# Patient Record
Sex: Female | Born: 1965 | Race: White | Hispanic: No | Marital: Single | Smoking: Former smoker
Health system: Southern US, Community
[De-identification: ages and names within clinical notes are randomized; demographics above are authoritative.]

## PROBLEM LIST (undated history)

## (undated) DIAGNOSIS — M2550 Pain in unspecified joint: Secondary | ICD-10-CM

## (undated) DIAGNOSIS — N951 Menopausal and female climacteric states: Secondary | ICD-10-CM

## (undated) DIAGNOSIS — Z860100 Personal history of colon polyps, unspecified: Secondary | ICD-10-CM

## (undated) DIAGNOSIS — E785 Hyperlipidemia, unspecified: Secondary | ICD-10-CM

## (undated) DIAGNOSIS — M899 Disorder of bone, unspecified: Secondary | ICD-10-CM

## (undated) DIAGNOSIS — G8929 Other chronic pain: Secondary | ICD-10-CM

## (undated) DIAGNOSIS — K219 Gastro-esophageal reflux disease without esophagitis: Secondary | ICD-10-CM

## (undated) DIAGNOSIS — M25539 Pain in unspecified wrist: Secondary | ICD-10-CM

## (undated) DIAGNOSIS — Z8541 Personal history of malignant neoplasm of cervix uteri: Secondary | ICD-10-CM

## (undated) DIAGNOSIS — K589 Irritable bowel syndrome without diarrhea: Secondary | ICD-10-CM

## (undated) DIAGNOSIS — F209 Schizophrenia, unspecified: Secondary | ICD-10-CM

## (undated) DIAGNOSIS — I10 Essential (primary) hypertension: Secondary | ICD-10-CM

## (undated) DIAGNOSIS — M545 Low back pain, unspecified: Secondary | ICD-10-CM

## (undated) DIAGNOSIS — N318 Other neuromuscular dysfunction of bladder: Secondary | ICD-10-CM

## (undated) DIAGNOSIS — L259 Unspecified contact dermatitis, unspecified cause: Secondary | ICD-10-CM

## (undated) DIAGNOSIS — M949 Disorder of cartilage, unspecified: Secondary | ICD-10-CM

## (undated) DIAGNOSIS — G2581 Restless legs syndrome: Secondary | ICD-10-CM

## (undated) DIAGNOSIS — Z87448 Personal history of other diseases of urinary system: Secondary | ICD-10-CM

## (undated) DIAGNOSIS — G43909 Migraine, unspecified, not intractable, without status migrainosus: Secondary | ICD-10-CM

## (undated) HISTORY — DX: Unspecified contact dermatitis, unspecified cause: L25.9

## (undated) HISTORY — DX: Personal history of malignant neoplasm of cervix uteri: Z85.41

## (undated) HISTORY — DX: Pain in unspecified wrist: M25.539

## (undated) HISTORY — DX: Personal history of colonic polyps: Z86.010

## (undated) HISTORY — DX: Irritable bowel syndrome, unspecified: K58.9

## (undated) HISTORY — DX: Personal history of colon polyps, unspecified: Z86.0100

## (undated) HISTORY — DX: Migraine, unspecified, not intractable, without status migrainosus: G43.909

## (undated) HISTORY — DX: Hyperlipidemia, unspecified: E78.5

## (undated) HISTORY — PX: FOOT SURGERY: SHX648

## (undated) HISTORY — DX: Low back pain, unspecified: M54.50

## (undated) HISTORY — DX: Menopausal and female climacteric states: N95.1

## (undated) HISTORY — DX: Schizophrenia, unspecified: F20.9

## (undated) HISTORY — DX: Essential (primary) hypertension: I10

## (undated) HISTORY — DX: Other neuromuscular dysfunction of bladder: N31.8

## (undated) HISTORY — DX: Pain in unspecified joint: M25.50

## (undated) HISTORY — PX: BREAST LUMPECTOMY: SHX2

## (undated) HISTORY — DX: Restless legs syndrome: G25.81

## (undated) HISTORY — DX: Disorder of cartilage, unspecified: M94.9

## (undated) HISTORY — DX: Low back pain: M54.5

## (undated) HISTORY — DX: Disorder of bone, unspecified: M89.9

## (undated) HISTORY — DX: Gastro-esophageal reflux disease without esophagitis: K21.9

## (undated) HISTORY — DX: Personal history of other diseases of urinary system: Z87.448

## (undated) HISTORY — DX: Other chronic pain: G89.29

---

## 1986-01-05 HISTORY — PX: HAND SURGERY: SHX662

## 2003-01-06 HISTORY — PX: CERVICAL CONE BIOPSY: SUR198

## 2003-01-06 HISTORY — PX: VAGINAL HYSTERECTOMY: SHX2639

## 2003-01-06 HISTORY — PX: BILATERAL OOPHORECTOMY: SHX1221

## 2005-01-05 HISTORY — PX: SHOULDER ARTHROSCOPY: SHX128

## 2008-01-06 HISTORY — PX: RETINAL DETACHMENT SURGERY: SHX105

## 2010-08-11 ENCOUNTER — Ambulatory Visit: Payer: Self-pay | Admitting: Family Medicine

## 2010-09-09 ENCOUNTER — Ambulatory Visit: Payer: Self-pay | Admitting: Gastroenterology

## 2010-10-01 ENCOUNTER — Ambulatory Visit: Payer: Self-pay

## 2010-10-07 ENCOUNTER — Ambulatory Visit: Payer: Self-pay | Admitting: Sports Medicine

## 2010-10-09 ENCOUNTER — Ambulatory Visit: Payer: Self-pay | Admitting: Gastroenterology

## 2010-10-15 ENCOUNTER — Ambulatory Visit: Payer: Self-pay | Admitting: Family Medicine

## 2010-10-29 ENCOUNTER — Ambulatory Visit: Payer: Self-pay | Admitting: Cardiovascular Disease

## 2010-10-31 ENCOUNTER — Ambulatory Visit: Payer: Self-pay | Admitting: Cardiovascular Disease

## 2010-11-19 ENCOUNTER — Ambulatory Visit: Payer: Self-pay | Admitting: Cardiovascular Disease

## 2011-02-25 ENCOUNTER — Encounter: Payer: Self-pay | Admitting: *Deleted

## 2011-03-31 ENCOUNTER — Ambulatory Visit: Payer: Self-pay | Admitting: Unknown Physician Specialty

## 2011-06-26 ENCOUNTER — Emergency Department: Payer: Self-pay | Admitting: Emergency Medicine

## 2011-08-25 DIAGNOSIS — N3941 Urge incontinence: Secondary | ICD-10-CM | POA: Insufficient documentation

## 2011-08-27 ENCOUNTER — Ambulatory Visit: Payer: Self-pay | Admitting: Specialist

## 2011-09-03 DIAGNOSIS — M542 Cervicalgia: Secondary | ICD-10-CM | POA: Insufficient documentation

## 2011-09-03 DIAGNOSIS — M5414 Radiculopathy, thoracic region: Secondary | ICD-10-CM | POA: Insufficient documentation

## 2011-09-04 LAB — COMPREHENSIVE METABOLIC PANEL
Anion Gap: 6 — ABNORMAL LOW (ref 7–16)
Bilirubin,Total: 0.7 mg/dL (ref 0.2–1.0)
Calcium, Total: 8.7 mg/dL (ref 8.5–10.1)
Chloride: 108 mmol/L — ABNORMAL HIGH (ref 98–107)
Co2: 26 mmol/L (ref 21–32)
Creatinine: 1 mg/dL (ref 0.60–1.30)
EGFR (Non-African Amer.): >60
Osmolality: 280 (ref 275–301)
Potassium: 3.9 mmol/L (ref 3.5–5.1)
Sodium: 140 mmol/L (ref 136–145)
Total Protein: 7.3 g/dL (ref 6.4–8.2)

## 2011-09-04 LAB — URINALYSIS, COMPLETE
Bilirubin,UR: NEGATIVE
Blood: NEGATIVE
Ketone: NEGATIVE
Ph: 5 (ref 4.5–8.0)
Protein: NEGATIVE
RBC,UR: 4 /HPF (ref 0–5)
Specific Gravity: 1.02 (ref 1.003–1.030)
WBC UR: 59 /HPF (ref 0–5)

## 2011-09-04 LAB — CBC
HGB: 14.4 g/dL (ref 12.0–16.0)
MCH: 31.6 pg (ref 26.0–34.0)
MCV: 90 fL (ref 80–100)
RBC: 4.55 10*6/uL (ref 3.80–5.20)

## 2011-09-04 LAB — ETHANOL: Ethanol: <3 mg/dL

## 2011-09-04 LAB — DRUG SCREEN, URINE
Amphetamines, Ur Screen: NEGATIVE (ref ?–1000)
Barbiturates, Ur Screen: POSITIVE (ref ?–200)
Benzodiazepine, Ur Scrn: POSITIVE (ref ?–200)
Cannabinoid 50 Ng, Ur ~~LOC~~: NEGATIVE (ref ?–50)
MDMA (Ecstasy)Ur Screen: NEGATIVE (ref ?–500)
Methadone, Ur Screen: NEGATIVE (ref ?–300)
Opiate, Ur Screen: POSITIVE (ref ?–300)
Phencyclidine (PCP) Ur S: NEGATIVE (ref ?–25)

## 2011-09-04 LAB — SALICYLATE LEVEL: Salicylates, Serum: 5.4 mg/dL — ABNORMAL HIGH

## 2011-09-04 LAB — PREGNANCY, URINE: Pregnancy Test, Urine: NEGATIVE m[IU]/mL

## 2011-09-04 LAB — TSH: Thyroid Stimulating Horm: 1.35 u[IU]/mL

## 2011-09-05 ENCOUNTER — Inpatient Hospital Stay: Payer: Self-pay | Admitting: Psychiatry

## 2011-09-15 ENCOUNTER — Ambulatory Visit: Payer: Self-pay | Admitting: Family Medicine

## 2011-09-16 ENCOUNTER — Ambulatory Visit: Payer: Self-pay | Admitting: Family Medicine

## 2011-09-22 DIAGNOSIS — R339 Retention of urine, unspecified: Secondary | ICD-10-CM | POA: Insufficient documentation

## 2011-11-12 DIAGNOSIS — R7301 Impaired fasting glucose: Secondary | ICD-10-CM | POA: Insufficient documentation

## 2012-03-04 ENCOUNTER — Emergency Department: Payer: Self-pay | Admitting: Emergency Medicine

## 2012-03-04 LAB — CBC
HGB: 13.8 g/dL (ref 12.0–16.0)
MCH: 28.4 pg (ref 26.0–34.0)
MCHC: 32.7 g/dL (ref 32.0–36.0)
MCV: 87 fL (ref 80–100)
Platelet: 231 10*3/uL (ref 150–440)
RBC: 4.84 10*6/uL (ref 3.80–5.20)
WBC: 10.2 10*3/uL (ref 3.6–11.0)

## 2012-03-04 LAB — COMPREHENSIVE METABOLIC PANEL
Alkaline Phosphatase: 141 U/L — ABNORMAL HIGH (ref 50–136)
Anion Gap: 3 — ABNORMAL LOW (ref 7–16)
Bilirubin,Total: 0.8 mg/dL (ref 0.2–1.0)
Calcium, Total: 8.6 mg/dL (ref 8.5–10.1)
Co2: 26 mmol/L (ref 21–32)
Creatinine: 0.93 mg/dL (ref 0.60–1.30)
Osmolality: 272 (ref 275–301)
Potassium: 4.6 mmol/L (ref 3.5–5.1)
SGOT(AST): 20 U/L (ref 15–37)
SGPT (ALT): 20 U/L (ref 12–78)

## 2012-03-15 DIAGNOSIS — E669 Obesity, unspecified: Secondary | ICD-10-CM | POA: Insufficient documentation

## 2012-03-15 DIAGNOSIS — Z720 Tobacco use: Secondary | ICD-10-CM | POA: Insufficient documentation

## 2012-03-15 DIAGNOSIS — S42209A Unspecified fracture of upper end of unspecified humerus, initial encounter for closed fracture: Secondary | ICD-10-CM | POA: Insufficient documentation

## 2012-05-03 DIAGNOSIS — G8918 Other acute postprocedural pain: Secondary | ICD-10-CM | POA: Insufficient documentation

## 2012-05-27 ENCOUNTER — Other Ambulatory Visit: Payer: Self-pay | Admitting: Gastroenterology

## 2012-07-22 ENCOUNTER — Other Ambulatory Visit: Payer: Self-pay | Admitting: Gastroenterology

## 2012-07-22 LAB — CLOSTRIDIUM DIFFICILE BY PCR

## 2012-08-04 ENCOUNTER — Other Ambulatory Visit: Payer: Self-pay | Admitting: Gastroenterology

## 2012-09-28 ENCOUNTER — Ambulatory Visit: Payer: Self-pay | Admitting: Neurology

## 2012-10-03 ENCOUNTER — Ambulatory Visit: Payer: Self-pay | Admitting: Gastroenterology

## 2012-10-04 LAB — PATHOLOGY REPORT

## 2012-10-10 ENCOUNTER — Ambulatory Visit: Payer: Self-pay | Admitting: Neurology

## 2012-10-18 DIAGNOSIS — M25619 Stiffness of unspecified shoulder, not elsewhere classified: Secondary | ICD-10-CM | POA: Insufficient documentation

## 2012-11-11 ENCOUNTER — Emergency Department: Payer: Self-pay | Admitting: Emergency Medicine

## 2012-11-11 LAB — CBC WITH DIFFERENTIAL/PLATELET
Eosinophil #: 0.1 10*3/uL (ref 0.0–0.7)
Eosinophil %: 2.3 %
HCT: 29.7 % — ABNORMAL LOW (ref 35.0–47.0)
HGB: 9.4 g/dL — ABNORMAL LOW (ref 12.0–16.0)
MCH: 23.5 pg — ABNORMAL LOW (ref 26.0–34.0)
MCHC: 31.8 g/dL — ABNORMAL LOW (ref 32.0–36.0)
MCV: 74 fL — ABNORMAL LOW (ref 80–100)
Monocyte #: 0.3 x10 3/mm (ref 0.2–0.9)
Monocyte %: 5.8 %
Neutrophil #: 3.8 10*3/uL (ref 1.4–6.5)
WBC: 5.2 10*3/uL (ref 3.6–11.0)

## 2012-11-11 LAB — COMPREHENSIVE METABOLIC PANEL
Albumin: 3.6 g/dL (ref 3.4–5.0)
Alkaline Phosphatase: 170 U/L — ABNORMAL HIGH (ref 50–136)
BUN: 7 mg/dL (ref 7–18)
Calcium, Total: 8.8 mg/dL (ref 8.5–10.1)
Chloride: 104 mmol/L (ref 98–107)
Co2: 29 mmol/L (ref 21–32)
Creatinine: 0.8 mg/dL (ref 0.60–1.30)
EGFR (African American): >60
Glucose: 118 mg/dL — ABNORMAL HIGH (ref 65–99)
Osmolality: 275 (ref 275–301)
Potassium: 3.8 mmol/L (ref 3.5–5.1)
SGPT (ALT): 21 U/L (ref 12–78)
Sodium: 138 mmol/L (ref 136–145)
Total Protein: 7.2 g/dL (ref 6.4–8.2)

## 2012-11-11 LAB — TROPONIN I: Troponin-I: <0.02 ng/mL

## 2013-01-17 DIAGNOSIS — M8430XA Stress fracture, unspecified site, initial encounter for fracture: Secondary | ICD-10-CM | POA: Insufficient documentation

## 2013-01-17 DIAGNOSIS — M84319A Stress fracture, unspecified shoulder, initial encounter for fracture: Secondary | ICD-10-CM | POA: Insufficient documentation

## 2013-02-15 DIAGNOSIS — M7520 Bicipital tendinitis, unspecified shoulder: Secondary | ICD-10-CM | POA: Insufficient documentation

## 2013-03-25 ENCOUNTER — Inpatient Hospital Stay: Payer: Self-pay | Admitting: Internal Medicine

## 2013-03-25 LAB — IRON AND TIBC
IRON SATURATION: 7 %
Iron Bind.Cap.(Total): 431 ug/dL (ref 250–450)
Iron: 29 ug/dL — ABNORMAL LOW (ref 50–170)
Unbound Iron-Bind.Cap.: 402 ug/dL

## 2013-03-25 LAB — BASIC METABOLIC PANEL
ANION GAP: 7 (ref 7–16)
BUN: 13 mg/dL (ref 7–18)
CO2: 27 mmol/L (ref 21–32)
Calcium, Total: 8.8 mg/dL (ref 8.5–10.1)
Chloride: 97 mmol/L — ABNORMAL LOW (ref 98–107)
Creatinine: 1.17 mg/dL (ref 0.60–1.30)
GFR CALC NON AF AMER: 54 — AB
GLUCOSE: 346 mg/dL — AB (ref 65–99)
Osmolality: 277 (ref 275–301)
Potassium: 4.5 mmol/L (ref 3.5–5.1)
Sodium: 131 mmol/L — ABNORMAL LOW (ref 136–145)

## 2013-03-25 LAB — PROTIME-INR
INR: 1
PROTHROMBIN TIME: 13.2 s (ref 11.5–14.7)

## 2013-03-25 LAB — CBC
HCT: 25.1 % — AB (ref 35.0–47.0)
HGB: 7.9 g/dL — ABNORMAL LOW (ref 12.0–16.0)
MCH: 22.5 pg — AB (ref 26.0–34.0)
MCHC: 31.4 g/dL — AB (ref 32.0–36.0)
MCV: 72 fL — ABNORMAL LOW (ref 80–100)
Platelet: 157 10*3/uL (ref 150–440)
RBC: 3.51 10*6/uL — AB (ref 3.80–5.20)
RDW: 20.1 % — ABNORMAL HIGH (ref 11.5–14.5)
WBC: 5.3 10*3/uL (ref 3.6–11.0)

## 2013-03-25 LAB — SEDIMENTATION RATE: ERYTHROCYTE SED RATE: 30 mm/h (ref 0–30)

## 2013-03-25 LAB — TROPONIN I: Troponin-I: <0.02 ng/mL

## 2013-03-25 LAB — TSH: Thyroid Stimulating Horm: 1.77 u[IU]/mL

## 2013-03-25 LAB — HEMOGLOBIN A1C: HEMOGLOBIN A1C: 8.3 % — AB (ref 4.2–6.3)

## 2013-03-26 LAB — BASIC METABOLIC PANEL
ANION GAP: 7 (ref 7–16)
BUN: 9 mg/dL (ref 7–18)
CALCIUM: 8.4 mg/dL — AB (ref 8.5–10.1)
CHLORIDE: 103 mmol/L (ref 98–107)
Co2: 27 mmol/L (ref 21–32)
Creatinine: 0.9 mg/dL (ref 0.60–1.30)
EGFR (Non-African Amer.): >60
Glucose: 137 mg/dL — ABNORMAL HIGH (ref 65–99)
Osmolality: 275 (ref 275–301)
POTASSIUM: 4.4 mmol/L (ref 3.5–5.1)
Sodium: 137 mmol/L (ref 136–145)

## 2013-03-26 LAB — CBC WITH DIFFERENTIAL/PLATELET
BASOS ABS: 0 10*3/uL (ref 0.0–0.1)
BASOS PCT: 0.7 %
Basophil #: 0 10*3/uL (ref 0.0–0.1)
Basophil %: 0.5 %
EOS PCT: 4.1 %
Eosinophil #: 0.2 10*3/uL (ref 0.0–0.7)
Eosinophil #: 0.3 10*3/uL (ref 0.0–0.7)
Eosinophil %: 3.1 %
HCT: 27.7 % — AB (ref 35.0–47.0)
HCT: 29.8 % — ABNORMAL LOW (ref 35.0–47.0)
HGB: 8.6 g/dL — AB (ref 12.0–16.0)
HGB: 9.3 g/dL — AB (ref 12.0–16.0)
LYMPHS ABS: 1.8 10*3/uL (ref 1.0–3.6)
LYMPHS PCT: 31 %
LYMPHS PCT: 24.1 %
Lymphocyte #: 1.5 10*3/uL (ref 1.0–3.6)
MCH: 22.9 pg — ABNORMAL LOW (ref 26.0–34.0)
MCH: 23 pg — ABNORMAL LOW (ref 26.0–34.0)
MCHC: 31 g/dL — AB (ref 32.0–36.0)
MCHC: 31.2 g/dL — ABNORMAL LOW (ref 32.0–36.0)
MCV: 74 fL — AB (ref 80–100)
MCV: 74 fL — ABNORMAL LOW (ref 80–100)
MONOS PCT: 7 %
Monocyte #: 0.4 x10 3/mm (ref 0.2–0.9)
Monocyte #: 0.5 x10 3/mm (ref 0.2–0.9)
Monocyte %: 8.3 %
NEUTROS ABS: 3.4 10*3/uL (ref 1.4–6.5)
Neutrophil #: 4 10*3/uL (ref 1.4–6.5)
Neutrophil %: 58.2 %
Neutrophil %: 63 %
Platelet: 114 10*3/uL — ABNORMAL LOW (ref 150–440)
Platelet: 170 10*3/uL (ref 150–440)
RBC: 3.75 10*6/uL — AB (ref 3.80–5.20)
RBC: 4.04 10*6/uL (ref 3.80–5.20)
RDW: 21.4 % — AB (ref 11.5–14.5)
RDW: 21.4 % — AB (ref 11.5–14.5)
WBC: 5.8 10*3/uL (ref 3.6–11.0)
WBC: 6.4 10*3/uL (ref 3.6–11.0)

## 2013-03-26 LAB — APTT
Activated PTT: 33.5 secs (ref 23.6–35.9)
Activated PTT: 65.3 secs — ABNORMAL HIGH (ref 23.6–35.9)

## 2013-03-27 LAB — HEPATIC FUNCTION PANEL A (ARMC)
ALBUMIN: 3.1 g/dL — AB (ref 3.4–5.0)
Alkaline Phosphatase: 187 U/L — ABNORMAL HIGH
BILIRUBIN DIRECT: 0.2 mg/dL (ref 0.00–0.20)
Bilirubin,Total: 0.7 mg/dL (ref 0.2–1.0)
SGOT(AST): 38 U/L — ABNORMAL HIGH (ref 15–37)
SGPT (ALT): 27 U/L (ref 12–78)
Total Protein: 6.9 g/dL (ref 6.4–8.2)

## 2013-03-27 LAB — APTT: ACTIVATED PTT: 76.2 s — AB (ref 23.6–35.9)

## 2013-03-27 LAB — PROTIME-INR
INR: 1.1
PROTHROMBIN TIME: 13.6 s (ref 11.5–14.7)

## 2013-03-27 LAB — PLATELET COUNT: PLATELETS: 158 10*3/uL (ref 150–440)

## 2013-03-27 LAB — HEMOGLOBIN: HGB: 8.9 g/dL — ABNORMAL LOW (ref 12.0–16.0)

## 2013-03-28 LAB — CBC WITH DIFFERENTIAL/PLATELET
BASOS PCT: 0.6 %
Basophil #: 0 10*3/uL (ref 0.0–0.1)
EOS ABS: 0.2 10*3/uL (ref 0.0–0.7)
Eosinophil %: 4.2 %
HCT: 27.6 % — ABNORMAL LOW (ref 35.0–47.0)
HGB: 8.5 g/dL — ABNORMAL LOW (ref 12.0–16.0)
LYMPHS ABS: 1.5 10*3/uL (ref 1.0–3.6)
Lymphocyte %: 29.2 %
MCH: 22.8 pg — AB (ref 26.0–34.0)
MCHC: 30.9 g/dL — AB (ref 32.0–36.0)
MCV: 74 fL — ABNORMAL LOW (ref 80–100)
Monocyte #: 0.3 x10 3/mm (ref 0.2–0.9)
Monocyte %: 6.6 %
NEUTROS ABS: 3.1 10*3/uL (ref 1.4–6.5)
Neutrophil %: 59.4 %
PLATELETS: 142 10*3/uL — AB (ref 150–440)
RBC: 3.74 10*6/uL — ABNORMAL LOW (ref 3.80–5.20)
RDW: 22.1 % — ABNORMAL HIGH (ref 11.5–14.5)
WBC: 5.2 10*3/uL (ref 3.6–11.0)

## 2013-03-28 LAB — PROTIME-INR
INR: 1.7
Prothrombin Time: 20 secs — ABNORMAL HIGH (ref 11.5–14.7)

## 2013-03-28 LAB — APTT: Activated PTT: 90 secs — ABNORMAL HIGH (ref 23.6–35.9)

## 2013-03-29 LAB — CBC WITH DIFFERENTIAL/PLATELET
Basophil #: 0 10*3/uL (ref 0.0–0.1)
Basophil %: 0.7 %
EOS PCT: 4.4 %
Eosinophil #: 0.2 10*3/uL (ref 0.0–0.7)
HCT: 29.7 % — ABNORMAL LOW (ref 35.0–47.0)
HGB: 9.2 g/dL — ABNORMAL LOW (ref 12.0–16.0)
Lymphocyte #: 1.8 10*3/uL (ref 1.0–3.6)
Lymphocyte %: 31.3 %
MCH: 23 pg — AB (ref 26.0–34.0)
MCHC: 30.9 g/dL — ABNORMAL LOW (ref 32.0–36.0)
MCV: 75 fL — AB (ref 80–100)
Monocyte #: 0.4 x10 3/mm (ref 0.2–0.9)
Monocyte %: 7.5 %
Neutrophil #: 3.2 10*3/uL (ref 1.4–6.5)
Neutrophil %: 56.1 %
PLATELETS: 149 10*3/uL — AB (ref 150–440)
RBC: 3.98 10*6/uL (ref 3.80–5.20)
RDW: 22.5 % — ABNORMAL HIGH (ref 11.5–14.5)
WBC: 5.7 10*3/uL (ref 3.6–11.0)

## 2013-03-29 LAB — APTT
Activated PTT: 135.7 secs — ABNORMAL HIGH (ref 23.6–35.9)
Activated PTT: 45.8 secs — ABNORMAL HIGH (ref 23.6–35.9)

## 2013-03-29 LAB — PROTIME-INR
INR: 3.2
Prothrombin Time: 31.5 secs — ABNORMAL HIGH (ref 11.5–14.7)

## 2013-04-07 ENCOUNTER — Ambulatory Visit: Payer: Self-pay | Admitting: Oncology

## 2013-04-07 LAB — PROTIME-INR
INR: 1.3
Prothrombin Time: 16.1 secs — ABNORMAL HIGH (ref 11.5–14.7)

## 2013-05-05 ENCOUNTER — Ambulatory Visit: Payer: Self-pay | Admitting: Oncology

## 2013-05-11 LAB — PROTIME-INR
INR: 1.5
PROTHROMBIN TIME: 18 s — AB (ref 11.5–14.7)

## 2013-06-05 ENCOUNTER — Ambulatory Visit: Payer: Self-pay | Admitting: Oncology

## 2013-06-22 LAB — CBC CANCER CENTER
Basophil #: 0.1 x10 3/mm (ref 0.0–0.1)
Basophil %: 0.8 %
EOS PCT: 6.2 %
Eosinophil #: 0.5 x10 3/mm (ref 0.0–0.7)
HCT: 33.5 % — ABNORMAL LOW (ref 35.0–47.0)
HGB: 10.8 g/dL — ABNORMAL LOW (ref 12.0–16.0)
Lymphocyte #: 1.6 x10 3/mm (ref 1.0–3.6)
Lymphocyte %: 21.8 %
MCH: 26.7 pg (ref 26.0–34.0)
MCHC: 32.1 g/dL (ref 32.0–36.0)
MCV: 83 fL (ref 80–100)
Monocyte #: 0.6 x10 3/mm (ref 0.2–0.9)
Monocyte %: 8.4 %
NEUTROS PCT: 62.8 %
Neutrophil #: 4.6 x10 3/mm (ref 1.4–6.5)
Platelet: 240 x10 3/mm (ref 150–440)
RBC: 4.02 10*6/uL (ref 3.80–5.20)
RDW: 16.1 % — ABNORMAL HIGH (ref 11.5–14.5)
WBC: 7.3 x10 3/mm (ref 3.6–11.0)

## 2013-06-22 LAB — PROTIME-INR
INR: 1.5
Prothrombin Time: 17.6 secs — ABNORMAL HIGH (ref 11.5–14.7)

## 2013-07-05 ENCOUNTER — Ambulatory Visit: Payer: Self-pay | Admitting: Oncology

## 2013-07-31 LAB — CBC CANCER CENTER
Basophil #: 0 x10 3/mm (ref 0.0–0.1)
Basophil %: 0.7 %
EOS ABS: 0.5 x10 3/mm (ref 0.0–0.7)
EOS PCT: 9.7 %
HCT: 27.1 % — ABNORMAL LOW (ref 35.0–47.0)
HGB: 8.6 g/dL — AB (ref 12.0–16.0)
Lymphocyte #: 1.5 x10 3/mm (ref 1.0–3.6)
Lymphocyte %: 26.3 %
MCH: 26.1 pg (ref 26.0–34.0)
MCHC: 31.6 g/dL — ABNORMAL LOW (ref 32.0–36.0)
MCV: 83 fL (ref 80–100)
Monocyte #: 0.4 x10 3/mm (ref 0.2–0.9)
Monocyte %: 7.8 %
Neutrophil #: 3.1 x10 3/mm (ref 1.4–6.5)
Neutrophil %: 55.5 %
Platelet: 191 x10 3/mm (ref 150–440)
RBC: 3.27 10*6/uL — ABNORMAL LOW (ref 3.80–5.20)
RDW: 15.9 % — ABNORMAL HIGH (ref 11.5–14.5)
WBC: 5.6 x10 3/mm (ref 3.6–11.0)

## 2013-07-31 LAB — PROTIME-INR
INR: 1.2
PROTHROMBIN TIME: 15 s — AB (ref 11.5–14.7)

## 2013-08-05 ENCOUNTER — Ambulatory Visit: Payer: Self-pay | Admitting: Oncology

## 2013-08-15 LAB — CBC CANCER CENTER
BASOS ABS: 0.1 x10 3/mm (ref 0.0–0.1)
Basophil %: 1 %
Eosinophil #: 0.3 x10 3/mm (ref 0.0–0.7)
Eosinophil %: 5.3 %
HCT: 31.7 % — ABNORMAL LOW (ref 35.0–47.0)
HGB: 10 g/dL — ABNORMAL LOW (ref 12.0–16.0)
LYMPHS ABS: 1.4 x10 3/mm (ref 1.0–3.6)
Lymphocyte %: 22.2 %
MCH: 26 pg (ref 26.0–34.0)
MCHC: 31.5 g/dL — ABNORMAL LOW (ref 32.0–36.0)
MCV: 83 fL (ref 80–100)
MONO ABS: 0.5 x10 3/mm (ref 0.2–0.9)
MONOS PCT: 7.6 %
Neutrophil #: 3.9 x10 3/mm (ref 1.4–6.5)
Neutrophil %: 63.9 %
PLATELETS: 252 x10 3/mm (ref 150–440)
RBC: 3.84 10*6/uL (ref 3.80–5.20)
RDW: 16.7 % — AB (ref 11.5–14.5)
WBC: 6.1 x10 3/mm (ref 3.6–11.0)

## 2013-08-15 LAB — PROTIME-INR
INR: 1
Prothrombin Time: 13.1 secs (ref 11.5–14.7)

## 2013-09-05 ENCOUNTER — Ambulatory Visit: Payer: Self-pay | Admitting: Oncology

## 2013-10-04 ENCOUNTER — Inpatient Hospital Stay: Payer: Self-pay | Admitting: Internal Medicine

## 2013-10-04 LAB — URINALYSIS, COMPLETE
Bilirubin,UR: NEGATIVE
GLUCOSE, UR: NEGATIVE mg/dL (ref 0–75)
Hyaline Cast: 6
Ketone: NEGATIVE
NITRITE: NEGATIVE
PH: 5 (ref 4.5–8.0)
Protein: 100
RBC,UR: 7 /HPF (ref 0–5)
Specific Gravity: 1.018 (ref 1.003–1.030)
Squamous Epithelial: 4
WBC UR: 84 /HPF (ref 0–5)

## 2013-10-04 LAB — CBC
HCT: 32 % — AB (ref 35.0–47.0)
HGB: 9.8 g/dL — ABNORMAL LOW (ref 12.0–16.0)
MCH: 24.4 pg — ABNORMAL LOW (ref 26.0–34.0)
MCHC: 30.6 g/dL — AB (ref 32.0–36.0)
MCV: 80 fL (ref 80–100)
PLATELETS: 155 10*3/uL (ref 150–440)
RBC: 4.01 10*6/uL (ref 3.80–5.20)
RDW: 16.7 % — AB (ref 11.5–14.5)
WBC: 23.6 10*3/uL — ABNORMAL HIGH (ref 3.6–11.0)

## 2013-10-04 LAB — BASIC METABOLIC PANEL
Anion Gap: 11 (ref 7–16)
BUN: 57 mg/dL — AB (ref 7–18)
CO2: 22 mmol/L (ref 21–32)
CREATININE: 3.09 mg/dL — AB (ref 0.60–1.30)
Calcium, Total: 8.2 mg/dL — ABNORMAL LOW (ref 8.5–10.1)
Chloride: 102 mmol/L (ref 98–107)
EGFR (Non-African Amer.): 17 — ABNORMAL LOW
GFR CALC AF AMER: 21 — AB
Glucose: 134 mg/dL — ABNORMAL HIGH (ref 65–99)
Osmolality: 288 (ref 275–301)
POTASSIUM: 4.4 mmol/L (ref 3.5–5.1)
Sodium: 135 mmol/L — ABNORMAL LOW (ref 136–145)

## 2013-10-04 LAB — CK TOTAL AND CKMB (NOT AT ARMC)
CK, Total: 77 U/L
CK-MB: 1.3 ng/mL (ref 0.5–3.6)

## 2013-10-04 LAB — HEPATIC FUNCTION PANEL A (ARMC)
ALBUMIN: 2.4 g/dL — AB (ref 3.4–5.0)
ALT: 23 U/L
AST: 50 U/L — AB (ref 15–37)
Alkaline Phosphatase: 185 U/L — ABNORMAL HIGH
Bilirubin, Direct: 2.1 mg/dL — ABNORMAL HIGH (ref 0.00–0.20)
Bilirubin,Total: 2.6 mg/dL — ABNORMAL HIGH (ref 0.2–1.0)
Total Protein: 7.2 g/dL (ref 6.4–8.2)

## 2013-10-04 LAB — TROPONIN I
Troponin-I: <0.02 ng/mL
Troponin-I: <0.02 ng/mL

## 2013-10-04 LAB — HEPARIN LEVEL (UNFRACTIONATED)

## 2013-10-04 LAB — CK-MB
CK-MB: 1.2 ng/mL (ref 0.5–3.6)
CK-MB: 1.1 ng/mL (ref 0.5–3.6)

## 2013-10-04 LAB — APTT: Activated PTT: 33 secs (ref 23.6–35.9)

## 2013-10-04 LAB — PROTIME-INR
INR: 1.3
PROTHROMBIN TIME: 16.1 s — AB (ref 11.5–14.7)

## 2013-10-04 LAB — PREGNANCY, URINE: Pregnancy Test, Urine: NEGATIVE m[IU]/mL

## 2013-10-05 LAB — TSH: THYROID STIMULATING HORM: 1.89 u[IU]/mL

## 2013-10-05 LAB — CBC WITH DIFFERENTIAL/PLATELET
BASOS PCT: 0.6 %
Basophil #: 0.1 10*3/uL (ref 0.0–0.1)
Eosinophil #: 0.3 10*3/uL (ref 0.0–0.7)
Eosinophil %: 2.5 %
HCT: 27.4 % — ABNORMAL LOW (ref 35.0–47.0)
HGB: 8.7 g/dL — AB (ref 12.0–16.0)
Lymphocyte #: 0.5 10*3/uL — ABNORMAL LOW (ref 1.0–3.6)
Lymphocyte %: 4 %
MCH: 24.9 pg — AB (ref 26.0–34.0)
MCHC: 31.7 g/dL — ABNORMAL LOW (ref 32.0–36.0)
MCV: 78 fL — ABNORMAL LOW (ref 80–100)
MONO ABS: 0.4 x10 3/mm (ref 0.2–0.9)
MONOS PCT: 3.6 %
NEUTROS ABS: 10.7 10*3/uL — AB (ref 1.4–6.5)
Neutrophil %: 89.3 %
PLATELETS: 100 10*3/uL — AB (ref 150–440)
RBC: 3.5 10*6/uL — ABNORMAL LOW (ref 3.80–5.20)
RDW: 17.2 % — ABNORMAL HIGH (ref 11.5–14.5)
WBC: 12 10*3/uL — ABNORMAL HIGH (ref 3.6–11.0)

## 2013-10-05 LAB — BASIC METABOLIC PANEL
Anion Gap: 8 (ref 7–16)
BUN: 56 mg/dL — ABNORMAL HIGH (ref 7–18)
CHLORIDE: 106 mmol/L (ref 98–107)
CO2: 23 mmol/L (ref 21–32)
CREATININE: 2.1 mg/dL — AB (ref 0.60–1.30)
Calcium, Total: 8 mg/dL — ABNORMAL LOW (ref 8.5–10.1)
EGFR (African American): 32 — ABNORMAL LOW
EGFR (Non-African Amer.): 26 — ABNORMAL LOW
GLUCOSE: 99 mg/dL (ref 65–99)
Osmolality: 289 (ref 275–301)
POTASSIUM: 4.4 mmol/L (ref 3.5–5.1)
Sodium: 137 mmol/L (ref 136–145)

## 2013-10-05 LAB — LIPID PANEL
Cholesterol: 73 mg/dL (ref 0–200)
HDL Cholesterol: 5 mg/dL — ABNORMAL LOW (ref 40–60)
LDL CHOLESTEROL, CALC: -4 mg/dL — AB (ref 0–100)
Triglycerides: 359 mg/dL — ABNORMAL HIGH (ref 0–200)
VLDL Cholesterol, Calc: 72 mg/dL — ABNORMAL HIGH (ref 5–40)

## 2013-10-05 LAB — HEPARIN LEVEL (UNFRACTIONATED)

## 2013-10-06 LAB — CBC WITH DIFFERENTIAL/PLATELET
Basophil #: 0 10*3/uL (ref 0.0–0.1)
Basophil %: 0.2 %
Eosinophil #: 0 10*3/uL (ref 0.0–0.7)
Eosinophil %: 0.6 %
HCT: 27.2 % — AB (ref 35.0–47.0)
HGB: 8.4 g/dL — ABNORMAL LOW (ref 12.0–16.0)
Lymphocyte #: 0.5 10*3/uL — ABNORMAL LOW (ref 1.0–3.6)
Lymphocyte %: 5.9 %
MCH: 24.7 pg — AB (ref 26.0–34.0)
MCHC: 30.9 g/dL — AB (ref 32.0–36.0)
MCV: 80 fL (ref 80–100)
Monocyte #: 0.5 x10 3/mm (ref 0.2–0.9)
Monocyte %: 6 %
NEUTROS ABS: 6.9 10*3/uL — AB (ref 1.4–6.5)
Neutrophil %: 87.3 %
PLATELETS: 80 10*3/uL — AB (ref 150–440)
RBC: 3.41 10*6/uL — ABNORMAL LOW (ref 3.80–5.20)
RDW: 17.1 % — ABNORMAL HIGH (ref 11.5–14.5)
WBC: 7.9 10*3/uL (ref 3.6–11.0)

## 2013-10-06 LAB — BASIC METABOLIC PANEL
Anion Gap: 2 — ABNORMAL LOW (ref 7–16)
BUN: 34 mg/dL — ABNORMAL HIGH (ref 7–18)
CALCIUM: 8 mg/dL — AB (ref 8.5–10.1)
Chloride: 110 mmol/L — ABNORMAL HIGH (ref 98–107)
Co2: 24 mmol/L (ref 21–32)
Creatinine: 1.63 mg/dL — ABNORMAL HIGH (ref 0.60–1.30)
EGFR (African American): 43 — ABNORMAL LOW
EGFR (Non-African Amer.): 35 — ABNORMAL LOW
Glucose: 89 mg/dL (ref 65–99)
OSMOLALITY: 279 (ref 275–301)
POTASSIUM: 4.3 mmol/L (ref 3.5–5.1)
Sodium: 136 mmol/L (ref 136–145)

## 2013-10-06 LAB — IRON AND TIBC
IRON SATURATION: 6 %
Iron Bind.Cap.(Total): 177 ug/dL — ABNORMAL LOW (ref 250–450)
Iron: 11 ug/dL — ABNORMAL LOW (ref 50–170)
UNBOUND IRON-BIND. CAP.: 166 ug/dL

## 2013-10-06 LAB — URINE CULTURE

## 2013-10-06 LAB — FERRITIN: Ferritin (ARMC): 155 ng/mL (ref 8–388)

## 2013-10-07 LAB — URINALYSIS, COMPLETE
BACTERIA: NONE SEEN
BILIRUBIN, UR: NEGATIVE
GLUCOSE, UR: NEGATIVE mg/dL (ref 0–75)
Ketone: NEGATIVE
Nitrite: NEGATIVE
Ph: 5 (ref 4.5–8.0)
Protein: 30
RBC,UR: 114 /HPF (ref 0–5)
Specific Gravity: 1.014 (ref 1.003–1.030)
WBC UR: 22 /HPF (ref 0–5)

## 2013-10-07 LAB — CBC WITH DIFFERENTIAL/PLATELET
Bands: 2 %
HCT: 26.5 % — ABNORMAL LOW (ref 35.0–47.0)
HGB: 8 g/dL — ABNORMAL LOW (ref 12.0–16.0)
LYMPHS PCT: 9 %
MCH: 24.5 pg — AB (ref 26.0–34.0)
MCHC: 30.2 g/dL — AB (ref 32.0–36.0)
MCV: 81 fL (ref 80–100)
METAMYELOCYTE: 1 %
Monocytes: 7 %
NRBC/100 WBC: 1 /
Platelet: 71 10*3/uL — ABNORMAL LOW (ref 150–440)
RBC: 3.26 10*6/uL — ABNORMAL LOW (ref 3.80–5.20)
RDW: 17.1 % — ABNORMAL HIGH (ref 11.5–14.5)
Segmented Neutrophils: 81 %
WBC: 6.1 10*3/uL (ref 3.6–11.0)

## 2013-10-07 LAB — AMMONIA: Ammonia, Plasma: <10 mcmol/L (ref 11–32)

## 2013-10-07 LAB — COMPREHENSIVE METABOLIC PANEL
Albumin: 1.6 g/dL — ABNORMAL LOW (ref 3.4–5.0)
Alkaline Phosphatase: 203 U/L — ABNORMAL HIGH
Anion Gap: 3 — ABNORMAL LOW (ref 7–16)
BILIRUBIN TOTAL: 1.3 mg/dL — AB (ref 0.2–1.0)
BUN: 29 mg/dL — ABNORMAL HIGH (ref 7–18)
Calcium, Total: 8.1 mg/dL — ABNORMAL LOW (ref 8.5–10.1)
Chloride: 110 mmol/L — ABNORMAL HIGH (ref 98–107)
Co2: 27 mmol/L (ref 21–32)
Creatinine: 1.22 mg/dL (ref 0.60–1.30)
EGFR (African American): >60
GFR CALC NON AF AMER: 50 — AB
GLUCOSE: 66 mg/dL (ref 65–99)
Osmolality: 283 (ref 275–301)
Potassium: 5 mmol/L (ref 3.5–5.1)
SGOT(AST): 73 U/L — ABNORMAL HIGH (ref 15–37)
SGPT (ALT): 30 U/L
Sodium: 140 mmol/L (ref 136–145)
TOTAL PROTEIN: 6 g/dL — AB (ref 6.4–8.2)

## 2013-10-08 ENCOUNTER — Ambulatory Visit: Payer: Self-pay | Admitting: Orthopedic Surgery

## 2013-10-08 LAB — CBC WITH DIFFERENTIAL/PLATELET
Basophil #: 0 10*3/uL (ref 0.0–0.1)
Basophil %: 0.6 %
EOS ABS: 0 10*3/uL (ref 0.0–0.7)
Eosinophil %: 0.5 %
HCT: 24.3 % — ABNORMAL LOW (ref 35.0–47.0)
HGB: 7.7 g/dL — AB (ref 12.0–16.0)
LYMPHS PCT: 9.9 %
Lymphocyte #: 0.7 10*3/uL — ABNORMAL LOW (ref 1.0–3.6)
MCH: 25.2 pg — ABNORMAL LOW (ref 26.0–34.0)
MCHC: 31.5 g/dL — ABNORMAL LOW (ref 32.0–36.0)
MCV: 80 fL (ref 80–100)
Monocyte #: 0.3 x10 3/mm (ref 0.2–0.9)
Monocyte %: 4.3 %
Neutrophil #: 6.3 10*3/uL (ref 1.4–6.5)
Neutrophil %: 84.7 %
PLATELETS: 100 10*3/uL — AB (ref 150–440)
RBC: 3.05 10*6/uL — ABNORMAL LOW (ref 3.80–5.20)
RDW: 17.5 % — ABNORMAL HIGH (ref 11.5–14.5)
WBC: 7.4 10*3/uL (ref 3.6–11.0)

## 2013-10-08 LAB — COMPREHENSIVE METABOLIC PANEL
ALT: 25 U/L
ANION GAP: 6 — AB (ref 7–16)
AST: 46 U/L — AB (ref 15–37)
Albumin: 1.5 g/dL — ABNORMAL LOW (ref 3.4–5.0)
Alkaline Phosphatase: 174 U/L — ABNORMAL HIGH
BILIRUBIN TOTAL: 0.9 mg/dL (ref 0.2–1.0)
BUN: 31 mg/dL — ABNORMAL HIGH (ref 7–18)
CO2: 24 mmol/L (ref 21–32)
CREATININE: 1.17 mg/dL (ref 0.60–1.30)
Calcium, Total: 8.1 mg/dL — ABNORMAL LOW (ref 8.5–10.1)
Chloride: 112 mmol/L — ABNORMAL HIGH (ref 98–107)
EGFR (African American): >60
EGFR (Non-African Amer.): 52 — ABNORMAL LOW
Glucose: 67 mg/dL (ref 65–99)
Osmolality: 288 (ref 275–301)
POTASSIUM: 4.4 mmol/L (ref 3.5–5.1)
Sodium: 142 mmol/L (ref 136–145)
Total Protein: 5.8 g/dL — ABNORMAL LOW (ref 6.4–8.2)

## 2013-10-09 LAB — CBC WITH DIFFERENTIAL/PLATELET
BASOS PCT: 0.3 %
Basophil #: 0 10*3/uL (ref 0.0–0.1)
Eosinophil #: 0 10*3/uL (ref 0.0–0.7)
Eosinophil %: 0.2 %
HCT: 24.3 % — AB (ref 35.0–47.0)
HGB: 7.4 g/dL — ABNORMAL LOW (ref 12.0–16.0)
LYMPHS ABS: 1.1 10*3/uL (ref 1.0–3.6)
Lymphocyte %: 10.2 %
MCH: 24.7 pg — ABNORMAL LOW (ref 26.0–34.0)
MCHC: 30.4 g/dL — AB (ref 32.0–36.0)
MCV: 81 fL (ref 80–100)
MONO ABS: 0.4 x10 3/mm (ref 0.2–0.9)
Monocyte %: 4 %
NEUTROS ABS: 8.9 10*3/uL — AB (ref 1.4–6.5)
NEUTROS PCT: 85.3 %
PLATELETS: 140 10*3/uL — AB (ref 150–440)
RBC: 3 10*6/uL — AB (ref 3.80–5.20)
RDW: 17.1 % — AB (ref 11.5–14.5)
WBC: 10.4 10*3/uL (ref 3.6–11.0)

## 2013-10-09 LAB — CULTURE, BLOOD (SINGLE)

## 2013-10-09 LAB — PHOSPHORUS: Phosphorus: 3.1 mg/dL (ref 2.5–4.9)

## 2013-10-09 LAB — MAGNESIUM: MAGNESIUM: 2 mg/dL

## 2013-10-10 LAB — CBC WITH DIFFERENTIAL/PLATELET
Basophil #: 0 10*3/uL (ref 0.0–0.1)
Basophil %: 0.2 %
Eosinophil #: 0 10*3/uL (ref 0.0–0.7)
Eosinophil %: 0.1 %
HCT: 22.1 % — ABNORMAL LOW (ref 35.0–47.0)
HGB: 6.8 g/dL — AB (ref 12.0–16.0)
Lymphocyte #: 0.8 10*3/uL — ABNORMAL LOW (ref 1.0–3.6)
Lymphocyte %: 7 %
MCH: 24.8 pg — AB (ref 26.0–34.0)
MCHC: 30.5 g/dL — AB (ref 32.0–36.0)
MCV: 81 fL (ref 80–100)
Monocyte #: 0.5 x10 3/mm (ref 0.2–0.9)
Monocyte %: 3.8 %
Neutrophil #: 10.7 10*3/uL — ABNORMAL HIGH (ref 1.4–6.5)
Neutrophil %: 88.9 %
PLATELETS: 159 10*3/uL (ref 150–440)
RBC: 2.73 10*6/uL — AB (ref 3.80–5.20)
RDW: 16.9 % — ABNORMAL HIGH (ref 11.5–14.5)
WBC: 12 10*3/uL — AB (ref 3.6–11.0)

## 2013-10-10 LAB — COMPREHENSIVE METABOLIC PANEL
ALK PHOS: 132 U/L — AB
Albumin: 1.4 g/dL — ABNORMAL LOW (ref 3.4–5.0)
Anion Gap: 5 — ABNORMAL LOW (ref 7–16)
BILIRUBIN TOTAL: 0.7 mg/dL (ref 0.2–1.0)
BUN: 13 mg/dL (ref 7–18)
CHLORIDE: 110 mmol/L — AB (ref 98–107)
CO2: 26 mmol/L (ref 21–32)
CREATININE: 1.05 mg/dL (ref 0.60–1.30)
Calcium, Total: 7.5 mg/dL — ABNORMAL LOW (ref 8.5–10.1)
EGFR (Non-African Amer.): 59 — ABNORMAL LOW
Glucose: 107 mg/dL — ABNORMAL HIGH (ref 65–99)
OSMOLALITY: 282 (ref 275–301)
POTASSIUM: 3.8 mmol/L (ref 3.5–5.1)
SGOT(AST): 23 U/L (ref 15–37)
SGPT (ALT): 13 U/L — ABNORMAL LOW
SODIUM: 141 mmol/L (ref 136–145)
TOTAL PROTEIN: 6.2 g/dL — AB (ref 6.4–8.2)

## 2013-10-10 LAB — IRON AND TIBC
IRON BIND. CAP.(TOTAL): 191 ug/dL — AB (ref 250–450)
Iron Saturation: 4 %
Iron: 7 ug/dL — ABNORMAL LOW (ref 50–170)
UNBOUND IRON-BIND. CAP.: 184 ug/dL

## 2013-10-10 LAB — FERRITIN: Ferritin (ARMC): 162 ng/mL (ref 8–388)

## 2013-10-11 LAB — HEMOGLOBIN
HGB: 7 g/dL — ABNORMAL LOW (ref 12.0–16.0)
HGB: 7.8 g/dL — ABNORMAL LOW (ref 12.0–16.0)

## 2013-10-11 LAB — URINE CULTURE

## 2013-10-11 LAB — EXPECTORATED SPUTUM ASSESSMENT W REFEX TO RESP CULTURE

## 2013-10-12 LAB — CBC WITH DIFFERENTIAL/PLATELET
Basophil #: 0 10*3/uL (ref 0.0–0.1)
Basophil %: 0.2 %
EOS PCT: 0.4 %
Eosinophil #: 0 10*3/uL (ref 0.0–0.7)
HCT: 25.6 % — ABNORMAL LOW (ref 35.0–47.0)
HGB: 8.2 g/dL — AB (ref 12.0–16.0)
LYMPHS ABS: 1 10*3/uL (ref 1.0–3.6)
LYMPHS PCT: 10.7 %
MCH: 26 pg (ref 26.0–34.0)
MCHC: 32 g/dL (ref 32.0–36.0)
MCV: 81 fL (ref 80–100)
MONOS PCT: 4.9 %
Monocyte #: 0.5 x10 3/mm (ref 0.2–0.9)
Neutrophil #: 8 10*3/uL — ABNORMAL HIGH (ref 1.4–6.5)
Neutrophil %: 83.8 %
PLATELETS: 227 10*3/uL (ref 150–440)
RBC: 3.15 10*6/uL — ABNORMAL LOW (ref 3.80–5.20)
RDW: 16.1 % — AB (ref 11.5–14.5)
WBC: 9.5 10*3/uL (ref 3.6–11.0)

## 2013-10-12 LAB — CREATININE, SERUM
CREATININE: 0.94 mg/dL (ref 0.60–1.30)
EGFR (Non-African Amer.): >60

## 2013-10-13 ENCOUNTER — Ambulatory Visit: Payer: Self-pay | Admitting: Urology

## 2013-10-13 LAB — CBC WITH DIFFERENTIAL/PLATELET
BASOS ABS: 0 10*3/uL (ref 0.0–0.1)
Basophil %: 0.3 %
EOS ABS: 0.1 10*3/uL (ref 0.0–0.7)
Eosinophil %: 0.7 %
HCT: 25 % — ABNORMAL LOW (ref 35.0–47.0)
HGB: 7.8 g/dL — ABNORMAL LOW (ref 12.0–16.0)
Lymphocyte #: 1.1 10*3/uL (ref 1.0–3.6)
Lymphocyte %: 15.6 %
MCH: 25.4 pg — ABNORMAL LOW (ref 26.0–34.0)
MCHC: 31.2 g/dL — ABNORMAL LOW (ref 32.0–36.0)
MCV: 81 fL (ref 80–100)
Monocyte #: 0.4 x10 3/mm (ref 0.2–0.9)
Monocyte %: 5.5 %
NEUTROS PCT: 77.9 %
Neutrophil #: 5.3 10*3/uL (ref 1.4–6.5)
PLATELETS: 254 10*3/uL (ref 150–440)
RBC: 3.07 10*6/uL — AB (ref 3.80–5.20)
RDW: 16 % — AB (ref 11.5–14.5)
WBC: 6.8 10*3/uL (ref 3.6–11.0)

## 2013-10-14 LAB — CBC WITH DIFFERENTIAL/PLATELET
Basophil #: 0 10*3/uL (ref 0.0–0.1)
Basophil %: 0.6 %
EOS ABS: 0.1 10*3/uL (ref 0.0–0.7)
Eosinophil %: 0.9 %
HCT: 25 % — ABNORMAL LOW (ref 35.0–47.0)
HGB: 7.9 g/dL — AB (ref 12.0–16.0)
Lymphocyte #: 1 10*3/uL (ref 1.0–3.6)
Lymphocyte %: 14.2 %
MCH: 25.8 pg — AB (ref 26.0–34.0)
MCHC: 31.7 g/dL — ABNORMAL LOW (ref 32.0–36.0)
MCV: 81 fL (ref 80–100)
Monocyte #: 0.5 x10 3/mm (ref 0.2–0.9)
Monocyte %: 6.4 %
NEUTROS PCT: 77.9 %
Neutrophil #: 5.5 10*3/uL (ref 1.4–6.5)
Platelet: 293 10*3/uL (ref 150–440)
RBC: 3.07 10*6/uL — AB (ref 3.80–5.20)
RDW: 16.4 % — AB (ref 11.5–14.5)
WBC: 7.1 10*3/uL (ref 3.6–11.0)

## 2013-10-14 LAB — URINALYSIS, COMPLETE
BILIRUBIN, UR: NEGATIVE
GLUCOSE, UR: NEGATIVE mg/dL (ref 0–75)
Ketone: NEGATIVE
Nitrite: NEGATIVE
Ph: 7 (ref 4.5–8.0)
Protein: 30
RBC,UR: 17 /HPF (ref 0–5)
Specific Gravity: 1.004 (ref 1.003–1.030)
Squamous Epithelial: 1

## 2013-10-14 LAB — OCCULT BLOOD X 1 CARD TO LAB, STOOL: Occult Blood, Feces: POSITIVE

## 2013-10-15 LAB — BASIC METABOLIC PANEL
ANION GAP: 7 (ref 7–16)
BUN: 9 mg/dL (ref 7–18)
CALCIUM: 8 mg/dL — AB (ref 8.5–10.1)
Chloride: 105 mmol/L (ref 98–107)
Co2: 30 mmol/L (ref 21–32)
Creatinine: 1 mg/dL (ref 0.60–1.30)
EGFR (African American): >60
Glucose: 89 mg/dL (ref 65–99)
OSMOLALITY: 281 (ref 275–301)
Potassium: 3.3 mmol/L — ABNORMAL LOW (ref 3.5–5.1)
Sodium: 142 mmol/L (ref 136–145)

## 2013-10-15 LAB — CBC WITH DIFFERENTIAL/PLATELET
BASOS ABS: 0 10*3/uL (ref 0.0–0.1)
BASOS PCT: 0.5 %
Eosinophil #: 0.1 10*3/uL (ref 0.0–0.7)
Eosinophil %: 1.1 %
HCT: 26.8 % — ABNORMAL LOW (ref 35.0–47.0)
HGB: 8.7 g/dL — ABNORMAL LOW (ref 12.0–16.0)
LYMPHS ABS: 0.9 10*3/uL — AB (ref 1.0–3.6)
LYMPHS PCT: 14 %
MCH: 26.2 pg (ref 26.0–34.0)
MCHC: 32.3 g/dL (ref 32.0–36.0)
MCV: 81 fL (ref 80–100)
MONO ABS: 0.5 x10 3/mm (ref 0.2–0.9)
Monocyte %: 7 %
NEUTROS ABS: 5.1 10*3/uL (ref 1.4–6.5)
Neutrophil %: 77.4 %
PLATELETS: 303 10*3/uL (ref 150–440)
RBC: 3.3 10*6/uL — ABNORMAL LOW (ref 3.80–5.20)
RDW: 16.6 % — ABNORMAL HIGH (ref 11.5–14.5)
WBC: 6.6 10*3/uL (ref 3.6–11.0)

## 2013-10-15 LAB — CULTURE, BLOOD (SINGLE)

## 2013-10-15 LAB — MAGNESIUM: MAGNESIUM: 1.5 mg/dL — AB

## 2013-10-16 LAB — CBC WITH DIFFERENTIAL/PLATELET
Basophil #: 0.1 10*3/uL (ref 0.0–0.1)
Basophil %: 1 %
Eosinophil #: 0.1 10*3/uL (ref 0.0–0.7)
Eosinophil %: 1.8 %
HCT: 25.7 % — ABNORMAL LOW (ref 35.0–47.0)
HGB: 8.3 g/dL — AB (ref 12.0–16.0)
Lymphocyte #: 1 10*3/uL (ref 1.0–3.6)
Lymphocyte %: 20.5 %
MCH: 26.1 pg (ref 26.0–34.0)
MCHC: 32.1 g/dL (ref 32.0–36.0)
MCV: 81 fL (ref 80–100)
MONO ABS: 0.4 x10 3/mm (ref 0.2–0.9)
Monocyte %: 8.2 %
NEUTROS ABS: 3.5 10*3/uL (ref 1.4–6.5)
Neutrophil %: 68.5 %
Platelet: 300 10*3/uL (ref 150–440)
RBC: 3.16 10*6/uL — ABNORMAL LOW (ref 3.80–5.20)
RDW: 16.3 % — ABNORMAL HIGH (ref 11.5–14.5)
WBC: 5.1 10*3/uL (ref 3.6–11.0)

## 2013-10-16 LAB — POTASSIUM: POTASSIUM: 3.2 mmol/L — AB (ref 3.5–5.1)

## 2013-10-16 LAB — MAGNESIUM: Magnesium: 2 mg/dL

## 2013-10-17 LAB — HEMOGLOBIN: HGB: 8.3 g/dL — ABNORMAL LOW (ref 12.0–16.0)

## 2013-10-17 LAB — POTASSIUM: Potassium: 3.6 mmol/L (ref 3.5–5.1)

## 2013-10-19 LAB — PATHOLOGY REPORT

## 2013-11-02 DIAGNOSIS — R197 Diarrhea, unspecified: Secondary | ICD-10-CM | POA: Insufficient documentation

## 2013-11-02 DIAGNOSIS — K253 Acute gastric ulcer without hemorrhage or perforation: Secondary | ICD-10-CM | POA: Insufficient documentation

## 2013-11-06 ENCOUNTER — Encounter: Payer: Self-pay | Admitting: *Deleted

## 2013-11-15 ENCOUNTER — Ambulatory Visit: Payer: Self-pay | Admitting: Pain Medicine

## 2013-11-20 DIAGNOSIS — F209 Schizophrenia, unspecified: Secondary | ICD-10-CM | POA: Insufficient documentation

## 2013-11-20 DIAGNOSIS — G8929 Other chronic pain: Secondary | ICD-10-CM | POA: Insufficient documentation

## 2013-11-20 DIAGNOSIS — I10 Essential (primary) hypertension: Secondary | ICD-10-CM | POA: Insufficient documentation

## 2013-11-20 DIAGNOSIS — G43909 Migraine, unspecified, not intractable, without status migrainosus: Secondary | ICD-10-CM | POA: Insufficient documentation

## 2013-11-20 DIAGNOSIS — K589 Irritable bowel syndrome without diarrhea: Secondary | ICD-10-CM | POA: Insufficient documentation

## 2013-11-24 ENCOUNTER — Ambulatory Visit: Payer: Self-pay | Admitting: Pain Medicine

## 2013-11-24 LAB — BASIC METABOLIC PANEL
ANION GAP: 7 (ref 7–16)
BUN: 7 mg/dL (ref 7–18)
CALCIUM: 8.1 mg/dL — AB (ref 8.5–10.1)
CO2: 27 mmol/L (ref 21–32)
Chloride: 105 mmol/L (ref 98–107)
Creatinine: 1.18 mg/dL (ref 0.60–1.30)
EGFR (African American): >60
EGFR (Non-African Amer.): 52 — ABNORMAL LOW
Glucose: 40 mg/dL — CL (ref 65–99)
OSMOLALITY: 272 (ref 275–301)
POTASSIUM: 4.2 mmol/L (ref 3.5–5.1)
Sodium: 139 mmol/L (ref 136–145)

## 2013-11-24 LAB — HEPATIC FUNCTION PANEL A (ARMC)
ALK PHOS: 184 U/L — AB
ALT: 15 U/L
Albumin: 2.9 g/dL — ABNORMAL LOW (ref 3.4–5.0)
Bilirubin, Direct: 0.1 mg/dL (ref 0.0–0.2)
Bilirubin,Total: 0.4 mg/dL (ref 0.2–1.0)
SGOT(AST): 22 U/L (ref 15–37)
TOTAL PROTEIN: 7.5 g/dL (ref 6.4–8.2)

## 2013-11-24 LAB — MAGNESIUM: MAGNESIUM: 1.8 mg/dL

## 2013-11-28 LAB — SEDIMENTATION RATE: Erythrocyte Sed Rate: 58 mm/hr — ABNORMAL HIGH (ref 0–30)

## 2013-12-05 ENCOUNTER — Ambulatory Visit: Payer: Self-pay | Admitting: Internal Medicine

## 2013-12-05 DIAGNOSIS — I472 Ventricular tachycardia: Secondary | ICD-10-CM

## 2013-12-05 LAB — CBC WITH DIFFERENTIAL/PLATELET
Basophil #: 0 10*3/uL (ref 0.0–0.1)
Basophil %: 0.5 %
EOS ABS: 0.3 10*3/uL (ref 0.0–0.7)
EOS PCT: 3.9 %
HCT: 32.3 % — ABNORMAL LOW (ref 35.0–47.0)
HGB: 10.3 g/dL — ABNORMAL LOW (ref 12.0–16.0)
LYMPHS ABS: 1.1 10*3/uL (ref 1.0–3.6)
LYMPHS PCT: 17.2 %
MCH: 28.3 pg (ref 26.0–34.0)
MCHC: 31.9 g/dL — AB (ref 32.0–36.0)
MCV: 89 fL (ref 80–100)
Monocyte #: 0.4 x10 3/mm (ref 0.2–0.9)
Monocyte %: 6.3 %
NEUTROS ABS: 4.7 10*3/uL (ref 1.4–6.5)
Neutrophil %: 72.1 %
Platelet: 228 10*3/uL (ref 150–440)
RBC: 3.65 10*6/uL — ABNORMAL LOW (ref 3.80–5.20)
RDW: 21.3 % — AB (ref 11.5–14.5)
WBC: 6.5 10*3/uL (ref 3.6–11.0)

## 2013-12-05 LAB — PROTIME-INR
INR: 1
Prothrombin Time: 13.5 secs (ref 11.5–14.7)

## 2013-12-05 LAB — BASIC METABOLIC PANEL
Anion Gap: 8 (ref 7–16)
BUN: 15 mg/dL (ref 7–18)
CHLORIDE: 104 mmol/L (ref 98–107)
CREATININE: 1.45 mg/dL — AB (ref 0.60–1.30)
Calcium, Total: 8.7 mg/dL (ref 8.5–10.1)
Co2: 27 mmol/L (ref 21–32)
GFR CALC AF AMER: 49 — AB
GFR CALC NON AF AMER: 41 — AB
Glucose: 115 mg/dL — ABNORMAL HIGH (ref 65–99)
OSMOLALITY: 279 (ref 275–301)
POTASSIUM: 4.5 mmol/L (ref 3.5–5.1)
Sodium: 139 mmol/L (ref 136–145)

## 2013-12-06 DIAGNOSIS — I472 Ventricular tachycardia: Secondary | ICD-10-CM

## 2013-12-06 LAB — CBC WITH DIFFERENTIAL/PLATELET
BASOS ABS: 0 10*3/uL (ref 0.0–0.1)
Basophil %: 0.4 %
EOS PCT: 3.9 %
Eosinophil #: 0.2 10*3/uL (ref 0.0–0.7)
HCT: 29.2 % — ABNORMAL LOW (ref 35.0–47.0)
HGB: 9.2 g/dL — AB (ref 12.0–16.0)
LYMPHS ABS: 1.1 10*3/uL (ref 1.0–3.6)
LYMPHS PCT: 26.6 %
MCH: 28.5 pg (ref 26.0–34.0)
MCHC: 31.6 g/dL — AB (ref 32.0–36.0)
MCV: 90 fL (ref 80–100)
MONO ABS: 0.2 x10 3/mm (ref 0.2–0.9)
Monocyte %: 5.7 %
Neutrophil #: 2.6 10*3/uL (ref 1.4–6.5)
Neutrophil %: 63.4 %
PLATELETS: 180 10*3/uL (ref 150–440)
RBC: 3.24 10*6/uL — AB (ref 3.80–5.20)
RDW: 21.3 % — ABNORMAL HIGH (ref 11.5–14.5)
WBC: 4.1 10*3/uL (ref 3.6–11.0)

## 2013-12-06 LAB — BASIC METABOLIC PANEL
Anion Gap: 4 — ABNORMAL LOW (ref 7–16)
BUN: 12 mg/dL (ref 7–18)
Calcium, Total: 8.4 mg/dL — ABNORMAL LOW (ref 8.5–10.1)
Chloride: 108 mmol/L — ABNORMAL HIGH (ref 98–107)
Co2: 29 mmol/L (ref 21–32)
Creatinine: 1.16 mg/dL (ref 0.60–1.30)
EGFR (Non-African Amer.): 53 — ABNORMAL LOW
GLUCOSE: 91 mg/dL (ref 65–99)
OSMOLALITY: 281 (ref 275–301)
Potassium: 4.6 mmol/L (ref 3.5–5.1)
Sodium: 141 mmol/L (ref 136–145)

## 2013-12-06 LAB — HEMOGLOBIN A1C: Hemoglobin A1C: 4.9 % (ref 4.2–6.3)

## 2013-12-22 ENCOUNTER — Ambulatory Visit: Payer: Self-pay | Admitting: Pain Medicine

## 2013-12-26 ENCOUNTER — Ambulatory Visit: Payer: Self-pay | Admitting: Pain Medicine

## 2014-01-23 DIAGNOSIS — E668 Other obesity: Secondary | ICD-10-CM | POA: Insufficient documentation

## 2014-01-23 DIAGNOSIS — E1165 Type 2 diabetes mellitus with hyperglycemia: Secondary | ICD-10-CM | POA: Insufficient documentation

## 2014-01-30 DIAGNOSIS — J9600 Acute respiratory failure, unspecified whether with hypoxia or hypercapnia: Secondary | ICD-10-CM | POA: Insufficient documentation

## 2014-01-31 DIAGNOSIS — F05 Delirium due to known physiological condition: Secondary | ICD-10-CM | POA: Insufficient documentation

## 2014-01-31 DIAGNOSIS — E872 Acidosis: Secondary | ICD-10-CM | POA: Insufficient documentation

## 2014-01-31 DIAGNOSIS — J9602 Acute respiratory failure with hypercapnia: Secondary | ICD-10-CM | POA: Insufficient documentation

## 2014-01-31 DIAGNOSIS — E875 Hyperkalemia: Secondary | ICD-10-CM | POA: Insufficient documentation

## 2014-01-31 DIAGNOSIS — N179 Acute kidney failure, unspecified: Secondary | ICD-10-CM | POA: Insufficient documentation

## 2014-02-13 DIAGNOSIS — M979XXA Periprosthetic fracture around unspecified internal prosthetic joint, initial encounter: Secondary | ICD-10-CM | POA: Insufficient documentation
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
## 2014-04-28 NOTE — Consult Note (Signed)
Chief Complaint:  Subjective/Chief Complaint Pt with ankle fx found to be anemic, hypoxic, with new onset DM. BP borderline. Some SOB. Denies CP. No cardiac hx.   Brief Assessment:  GEN no acute distress, obese   Cardiac Regular   Respiratory rhonchi   Gastrointestinal details normal Soft  Nontender  Bowel sounds normal   EXTR positive edema   Lab Results: Routine Chem:  21-Mar-15 14:17   Glucose, Serum  346  BUN 13  Creatinine (comp) 1.17  Sodium, Serum  131  Potassium, Serum 4.5  Chloride, Serum  97  CO2, Serum 27  Anion Gap 7  eGFR (Non-African American)  54 (eGFR values <37m/min/1.73 m2 may be an indication of chronic kidney disease (CKD). Calculated eGFR is useful in patients with stable renal function. The eGFR calculation will not be reliable in acutely ill patients when serum creatinine is changing rapidly. It is not useful in  patients on dialysis. The eGFR calculation may not be applicable to patients at the low and high extremes of body sizes, pregnant women, and vegetarians.)  Cardiac:  21-Mar-15 14:17   Troponin I < 0.02 (0.00-0.05 0.05 ng/mL or less: NEGATIVE  Repeat testing in 3-6 hrs  if clinically indicated. >0.05 ng/mL: POTENTIAL  MYOCARDIAL INJURY. Repeat  testing in 3-6 hrs if  clinically indicated. NOTE: An increase or decrease  of 30% or more on serial  testing suggests a  clinically important change)  Routine Hem:  21-Mar-15 14:17   WBC (CBC) 5.3  Hemoglobin (CBC)  7.9  Platelet Count (CBC) 157 (Result(s) reported on 25 Mar 2013 at 02:31PM.)   Radiology Results: XRay:    21-Mar-15 14:44, Chest Portable Single View  Chest Portable Single View   REASON FOR EXAM:    fall,  COMMENTS:       PROCEDURE: DXR - DXR PORTABLE CHEST SINGLE VIEW  - Mar 25 2013  2:44PM     CLINICAL DATA:  Fall.    EXAM:  PORTABLE CHEST - 1 VIEW    COMPARISON:  October 15, 2010.    FINDINGS:  The heart size and mediastinal contours are within normal  limits.  Both lungs are clear. No pneumothorax or pleural effusion is noted.  Status post left shoulder arthroplasty.     IMPRESSION:  No acute cardiopulmonary abnormality seen.      Electronically Signed    By: JSabino DickM.D.    On: 03/25/2013 14:53         Verified By: JMarveen Reeks M.D.,   Assessment/Plan:  Assessment/Plan:  Plan Will send off anemia labs and guaiac stools. Transfusion per Ortho. CT chest due to LE fx and hypoxia. Begin SSI for DM. Add ACEI. Will follow.   Electronic Signatures: SIdelle Crouch(MD)  (Signed 21-Mar-15 16:21)  Authored: Chief Complaint, Brief Assessment, Lab Results, Radiology Results, Assessment/Plan   Last Updated: 21-Mar-15 16:21 by SIdelle Crouch(MD)

## 2014-04-28 NOTE — Discharge Summary (Signed)
PATIENT NAME:  [name] Haas, [name] MR#:  [id number] DATE OF BIRTH:  [birth year]  DATE OF ADMISSION:  10/04/2013 DATE OF DISCHARGE:  10/17/2013   PRIMARY CARE PHYSICIAN: Nonlocal.   DISCHARGE DIAGNOSES:  1.  Acute right sided pyelonephritis caused by a gas-producing organism with a scattered foci of subcutaneous emphysema.  2.  Enterobacteria aerogenes bacteremia.  3.  Occult gastrointestinal bleeding.  4.  Iron deficiency anemia.  5.  Acute metabolic encephalopathy due to acute respiratory failure.  6.  Acute kidney injury.  7.  Hypertension.  8.  History of pulmonary embolism.  9.  Likely underlying sleep apnea.  10.  Obesity.   CONDITION: Stable.   CODE STATUS: Full Code.   HOME MEDICATIONS: Please refer to the medication reconciliation list. The patient's Xarelto discontinued due to occult gastrointestinal bleeding and anemia.   DIET: Low-sodium, low-fat, low-cholesterol diet.   ACTIVITY: As tolerated.   FOLLOW-UP CARE: Follow up with Dr. Marva PandaSkulskie and Dr. Sampson GoonFitzgerald within 1-2 weeks. Follow up with PCP, pulmonary Dr. Welton FlakesKhan, and Dr. Hyacinth MeekerMiller orthopedic surgeon, within 2 weeks.   For a detailed history and physical examination, please refer to the admission note dictated by Dr. Auburn BilberryShreyang Patel, and the interim discharge summary dictated by Dr. Katharina Caperima Vaickute.  HOSPITAL COURSE:  1.  Acute metabolic encephalopathy due to hypoxia and hypercapnia, and also possibly due to infection. The patient may have an underlying OSA due to obesity. The patient was disoriented, but her mental status improved after treatment of her respiratory failure and infection.  The patient's CAT scan of head was negative.   2.  Acute hypercapnic and hypoxic respiratory failure, likely due to underlying sleep apnea plus pneumonia; however, sputum culture is negative. The patient has been treated with Levaquin, meropenem and vancomycin.3.  Hypercapnic, hypoxemic respiratory failure, possibly due to a medication and OSA, as  well as hypoventilation syndrome and questionable pneumonia. The patient's sputum culture was negative. The patient was intubated and on ventilation for a few days. She was extubated on 10/09/2013. Chest x-ray revealed mild basilar atelectasis versus infiltrate. After extubation, the patient's shortness of breath has much improved. She was off oxygen.   4.  Sepsis. The patient's blood culture showed Enterobacter aerogenes. Initially etiology was unclear. The patient's urinalysis showed mild UTI. Urine culture showed mixed bacterial organisms and Escherichia coli, but negative Enterobacter aerogenes. Repeat blood culture was negative. The patient has been treated with Levaquin, meropenem, and vancomycin. However, CAT scan of the abdomen and pelvis showed pyelonephritis caused by gas producing organisms with scattered foci of subcutaneous emphysema. The urologist, Dr. Arlyce DiceKaplan evaluated the patient, and suggested no indication for any procedure; he suggested continue IV antibiotic treatment. We requested ID consult from Dr. Sampson GoonFitzgerald. He suggested changing to Rocephin IV for at least 2 weeks. The patient's leukocytosis improved after IV antibiotics. The patient also had an acute kidney injury after IV fluid support. The patient's kidney injury improved.  5.  Anemia with iron deficiency, possibly due to chronic blood loss and no occult gastrointestinal bleeding. The patient's stool occult was positive so Xarelto was discontinued. The patient got a PRBC transfusion. After discontinuing Xarelto, the patient's hemoglobin has been stable. Hemoglobin today is 8.3. Since the patient has occult GI bleeding, Dr. Marva PandaSkulskie did EGD today, which showed gastric ulcer. He suggested PPI bid for 6 weeks, then daily thereafter.  6.  The patient's hypertension has been controlled.  7.  The patient has a history of pulmonary embolus since March, due to left ankle  fracture. The patient's Xarelto is discontinued due to  gastrointestinal bleeding and anemia. The patient needs to follow up with Dr. Hyacinth Meeker, orthopedic surgeon, for status ankle fracture.   The patient has no complaints. Vital signs are stable. She will be discharged to a skilled nursing facility after an endoscopy today. I discussed the patient's discharge plan with the patient, patient's father, nurse, case Production designer, theatre/television/film and Child psychotherapist. Also discussed with Dr. Marva Panda.   TIME SPENT: About 48 minutes.     ____________________________ Shaune Pollack, MD qc:MT D: 10/17/2013 12:06:27 ET T: 10/17/2013 12:33:07 ET JOB#: [id number]  cc: Shaune Pollack, MD, <Dictator> Shaune Pollack MD ELECTRONICALLY SIGNED 10/17/2013 15:58

## 2014-04-28 NOTE — Consult Note (Signed)
Chief Complaint:  Subjective/Chief Complaint Left ankle stable and comfortable.  circulation/sensation/motor function good.  post reductin X-rays show good position of fracture fragments and joint.  Plan to follow-up in office in 2 weeks.   VITAL SIGNS/ANCILLARY NOTES: **Vital Signs.:   24-Mar-15 08:28  Vital Signs Type Q 4hr  Temperature Temperature (F) 98.2  Celsius 36.7  Temperature Source oral  Pulse Pulse 99  Respirations Respirations 22  Systolic BP Systolic BP 125  Diastolic BP (mmHg) Diastolic BP (mmHg) 74  Mean BP 91  Pulse Ox % Pulse Ox % 87  Oxygen Delivery Room Air/ 21 %  Pulse Ox After Adjustment (RN or RCP Only) % 92  Oxygen Delivery Adjusted To (RN or RCP Only)  2L   Radiology Results: XRay:    23-Mar-15 17:33, Ankle Left Complete  Ankle Left Complete   REASON FOR EXAM:    post reduction and cast application  COMMENTS:       PROCEDURE: DXR - DXR ANKLE LEFT COMPLETE  - Mar 27 2013  5:33PM     CLINICAL DATA:  Post reduction and cast application.    EXAM:  LEFT ANKLE COMPLETE - 3+ VIEW    COMPARISON:  03/25/2013    FINDINGS:  Cast material remains in place. Trimalleolar fracture is again  identified. Mild displacement of the fracture fragments is similar  to the prior study. There remains mild lateral subluxation of the  talus relative to the tibia.     IMPRESSION:  Trimalleolar fracture with mild displacement of fracture fragments  and mild residual tibiotalar subluxation.      Electronically Signed    By: Sebastian AcheAllen  Grady    On: 03/27/2013 21:08         Verified By: Prudy FeelerALLEN T. GRADY, M.D.,   Electronic Signatures: Valinda HoarMiller, Britanni Yarde E (MD)  (Signed 24-Mar-15 12:46)  Authored: Chief Complaint, VITAL SIGNS/ANCILLARY NOTES, Radiology Results   Last Updated: 24-Mar-15 12:46 by Valinda HoarMiller, Carlito Bogert E (MD)

## 2014-04-28 NOTE — Consult Note (Signed)
PATIENT NAME:  [name] Haas, [name] Haas MR#:  [id number] DATE OF BIRTH:  [birth year]  DATE OF CONSULTATION:  10/13/2013  REFERRING PHYSICIAN:   CONSULTING PHYSICIAN:  Kathreen DevoidKevin L. Osmar Howton, MD  DIAGNOSIS:  Left trimalleolar ankle fracture dislocation, chronic.   HISTORY OF PRESENT ILLNESS: [name] Haas is a [age] year old female with multiple medical comorbidities including diabetes, hypertension, history of anemia, schizophrenia, obesity who initially presented on 03/25/2013 with the left trimalleolar ankle fracture dislocation. It was reduced in the ER by the ER physician, Dr. Carollee MassedKaminski.  Dr. Deeann SaintHoward Miller from Orthopedics was consulted during the patient's admission in March.  Given the multiple comorbidities at this admission, was deemed not a surgical candidate and was treated in a cast.  Her hospitalization in March was complicated by bilateral pulmonary emboli with acute respiratory failure and for this reason was treated conservatively. The patient was admitted on 09/30 with  lethargy and was found upon presentationi to the ER to have an oxygen saturation of 78%.  She required a stay in the  Intensive Care Unit for respiratory failure.  She is currently out of the ICU and a consult is requested from orthopedics on recommendations for her weight-bearing status.   The patient's past medical history, past surgical history, family and social history, as well as medication and allergies were [facility].   PHYSICAL EXAMINATION:   LEFT ANKLE:  The patient's skin is intact.  There is no obvious deformity.  She has no significant tenderness over the medial and lateral malleoli.  There was no crepitus of the fracture sites.  She can actively dorsiflex and plantarflex her ankle.  She can dorsiflex approximately 10 degrees and plantarflex approximately 60 degrees.  There is no ankle instability on drawer testing.  She has palpable pedal pulses, intact sensation to light touch and 5/5  strength in all muscle groups of the left lower leg.     RADIOLOGY: X-ray films of the left ankle during this hospitalization reveal a medial and lateral malleolar fractures that are well-healed.  There is a few millimeters of lateral translation of  talus. At the mortise, there was no widening of the syndesmosis.   ASSESSMENT:   Healed left trimalleolar ankle fracture from March 2015.  PLAN:  [name] Haas has been wearing a walking boot.  Given that she was recently in the Intensive Care Unit and has not been ambulating, I recommend physical therapy for her.  I would recommend that she wear her walking boot and use a walker while up with physical therapy.  She may be weight-bearing as tolerated in the boot.  The patient may remove the boot  when she is in bed or sitting down or performing nonweightbearing exercises with physical therapy.  She may transition to full weight-bearing in the boot as her balance and strength allow. If there are any issues, the patient may follow up with Dr. [facility]ard [facility].      ____________________________ Kathreen DevoidKevin L. Denisia Harpole, MD klk:DT D: 10/14/2013 11:37:00 ET T: 10/14/2013 12:13:57 ET JOB#: [id number]  cc: Kathreen DevoidKevin L. Ravin Bendall, MD, <Dictator> Kathreen DevoidKEVIN L Eian Vandervelden MD ELECTRONICALLY SIGNED 10/14/2013 17:48

## 2014-04-28 NOTE — Consult Note (Signed)
Brief Consult Note: Diagnosis: anmeia, iron deficiency, heme positive.   Patient was seen by consultant.   Consult note dictated.   Recommend further assessment or treatment.   Comments: Please see full GI consult [phone]#[id number].  Complex hospitalization, found to have iron def and heme positive stool.  Patietn has been using BC arthritis strength (1000mg  ASA in a single dose) as o/p. I do recommend EGD, however I am concerned about pulmonary status in considering a sedated proceedure.  Would consider pulmonary consult for opinion on risk stratification.  Continue bid ppi. Following.  Electronic Signatures: Barnetta ChapelSkulskie, Javad Salva (MD)  (Signed 11-Oct-15 15:00)  Authored: Brief Consult Note   Last Updated: 11-Oct-15 15:00 by Barnetta ChapelSkulskie, Cleophus Mendonsa (MD)

## 2014-04-28 NOTE — Consult Note (Signed)
Brief Consult Note: Diagnosis: Left trimalleolar ankle fracture.   Patient was seen by consultant.   Consult note dictated.   Recommend further assessment or treatment.   Comments: Father at [phone] gave all the history.  PLAN: May keep boot off LLE while in ICU TDWB to LLE NWB to LUE (h/o TSA)  Re-assess when extubated, awake and conversant.  Electronic Signatures: Ruthe MannanApel, Marvelle Span (MD)  (Signed 04-Oct-15 11:16)  Authored: Brief Consult Note   Last Updated: 04-Oct-15 11:16 by Ruthe MannanApel, Issiac Jamar (MD)

## 2014-04-28 NOTE — Consult Note (Signed)
Brief Consult Note: Diagnosis: Left trimalleolar ankle fracture dislocation.   Patient was seen by consultant.   Recommend to proceed with surgery or procedure.   Recommend further assessment or treatment.   Orders entered.   Discussed with Attending MD.   Comments: 49 year old female twisted her left ankle today and was brought to Emergency Room where exam and X-rays show a completely displaced trimalleolar fracture dislocation of the left ankle.  This was reduced by Dr Carollee MassedKaminski and splinted with post reduction X-rays showing satisfactory position of the joint and fracture fragments. Workup in the Emergency Room showed her to have bilateral pulmonary emboli, newly diagnosed Diabetes, hypertension, undiagnosed anemia with hemoglobin level of 7.9, schizophrenia, obesity, and mild hyponatremia.  No local MD since Dr Delfin EdisAndreossi left town.  Exam:  Alert, pleasant, and cooperative.  Lying in bed with nasal O2 and some stridor.  No injuries other than left ankle.  circulation/sensation/motor function good distally.  splint in place. Skin intact per Dr Daleen SquibbKaminsli and I do not want to remove splint now that fracture is reduced.    X-rays: as above  Imp: Unstable left trimalleolar fracture dislocation of the ankle.         Multiple medical issues.    Rx:  open reduction and internal fixation of the ankle would be the optimal treatment.  However, cast immobilization can be attempted if her medical issues preclude safe and low risk surgical treatment. Will allow medical service to sort out all her problems and let us know if surgery if feasible. Discussed at length with Drs. sparks and AtticaModi..  Electronic Signatures: Valinda HoarMiller, Justine Dines E (MD)  (Signed 21-Mar-15 18:54)  Authored: Brief Consult Note   Last Updated: 21-Mar-15 18:54 by Valinda HoarMiller, Lary Eckardt E (MD)

## 2014-04-28 NOTE — Consult Note (Signed)
Chief Complaint:  Subjective/Chief Complaint seen for heme positive stool and and anemia.  doing well, denies abdominalpain or nausea.  no sob, ambulating with assistance.   VITAL SIGNS/ANCILLARY NOTES: **Vital Signs.:   13-Oct-15 13:08  Vital Signs Type Routine  Temperature Temperature (F) 98.1  Celsius 36.7  Pulse Pulse 81  Respirations Respirations 20  Systolic BP Systolic BP 101  Diastolic BP (mmHg) Diastolic BP (mmHg) 64  Mean BP 76  Pulse Ox % Pulse Ox % 92  Pulse Ox Activity Level  At rest  Oxygen Delivery Room Air/ 21 %   Brief Assessment:  GEN obese   Cardiac Regular   Respiratory clear BS   Gastrointestinal details normal Soft  Nontender  Nondistended  No masses palpable  Bowel sounds normal   Lab Results: Routine Chem:  13-Oct-15 10:08   Potassium, Serum 3.6 (Result(s) reported on 17 Oct 2013 at 10:37AM.)  Routine Hem:  30-Sep-15 08:20   Hemoglobin (CBC)  9.8  01-Oct-15 07:18   Hemoglobin (CBC)  8.7  02-Oct-15 03:34   Hemoglobin (CBC)  8.4  03-Oct-15 10:01   Hemoglobin (CBC)  8.0  04-Oct-15 04:57   Hemoglobin (CBC)  7.7  05-Oct-15 06:30   Hemoglobin (CBC)  7.4  06-Oct-15 04:46   Hemoglobin (CBC)  6.8  07-Oct-15 08:15   Hemoglobin (CBC)  7.0    19:07   Hemoglobin (CBC)  7.8  08-Oct-15 04:30   Hemoglobin (CBC)  8.2  09-Oct-15 05:51   Hemoglobin (CBC)  7.8  10-Oct-15 05:21   Hemoglobin (CBC)  7.9  11-Oct-15 04:10   Hemoglobin (CBC)  8.7  12-Oct-15 04:30   Hemoglobin (CBC)  8.3  Platelet Count (CBC) 300  13-Oct-15 10:08   Hemoglobin (CBC)  8.3 (Result(s) reported on 17 Oct 2013 at 10:50AM.)   Assessment/Plan:  Assessment/Plan:  Assessment 1) anemia, heme positive stool-hemodynamically stable no gross bleeding, no other gi sx.  2) pyelonephritis, sepsis, respiratory failure   Plan 1) egd today.  I have discussed the risks benefits and complications of egd to include not limited to bleeding infection perforation and sedation she wishes to  proceed. further recs to follow.   Electronic Signatures: Barnetta ChapelSkulskie, Aiva Miskell (MD)  (Signed 13-Oct-15 14:33)  Authored: Chief Complaint, VITAL SIGNS/ANCILLARY NOTES, Brief Assessment, Lab Results, Assessment/Plan   Last Updated: 13-Oct-15 14:33 by Barnetta ChapelSkulskie, Gricel Copen (MD)

## 2014-04-28 NOTE — Consult Note (Signed)
PATIENT NAME:  [name] FossaBATTON, Laasia MR#:  [id number] DATE OF BIRTH:  February 09, 1965  DATE OF CONSULTATION:  10/16/2013  REFERRING PHYSICIAN:   CONSULTING PHYSICIAN:  Stann Mainlandavid P. Sampson GoonFitzgerald, MD  REQUESTING PHYSICIAN:  Dr. Imogene Burnhen.   REASON FOR CONSULTATION: Pyelonephritis.   HISTORY OF PRESENT ILLNESS: This is a 49 year old female with multiple medical problems, admitted September 30 with increasing shortness of breath. She also had a leukocytosis at that time. The patient was admitted and treated with IV fluids for dehydration and started on levofloxacin. Urine culture and blood cultures ended up being positive. She was seen by urology for a CT scan which showed evidence of pyelonephritis with some renal failure air  emphysema. The patient was intubated at that time, but has cleared and is now out of the ICU. Urology had recommended a 6 week course of IV antibiotics.   Currently she has been afebrile. She is having back pain or dysuria. She was initially on meropenem and is now on ertapenem. Plans are being made for discharge either to home health or to a skilled nursing facility.   PAST MEDICAL HISTORY:  1. Obesity.  2. Prior PE following an ankle fracture.  3. Iron deficiency anemia.  4. Hypertension.  5. Migraine headache.  6. Schizophrenia.  7. Diabetes.  8. Hyperlipidemia.  9. Degenerative disk disease.  10. Chronic pain.  11. Chronic insomnia.  12. History of cervical cancer status post hysterectomy.  13. History of bilateral shoulder surgery and left hand surgeries.   SOCIAL HISTORY: She lives with her father. Used to smoke.   FAMILY HISTORY: Noncontributory.   ALLERGIES: INCLUDE VIOXX, BEXTRA, CELEBREX, PENICILLIN, AND SULFA DRUGS. SHE REPORTS PENICILLIN CAUSES A RASH BUT NOT AN ANAPHYLACTIC REACTION.   REVIEW OF SYSTEMS: 11 systems reviewed and negative except as per HPI.    ANTIBIOTICS SINCE ADMISSION: Include meropenem from October 5 through the 11th.  Changed to ertapenem currently,  levofloxacin dose was given on the 1st through the 7th.    PHYSICAL EXAMINATION:  VITAL SIGNS: Temperature 97.7, pulse 90, blood pressure 116/79, respirations 20, saturation 92% on 2 liters.  GENERAL: She is obese, lying in bed, in no acute distress.  HEENT: Pupils equal, round, and reactive to light and accommodation. Extraocular movements are intact. Sclerae anicteric.  OROPHARYNX: Clear.  NECK: Supple.  HEART: Regular.  LUNGS: Clear.  ABDOMEN: Soft, nontender, nondistended. No CVA tenderness.  EXTREMITIES: Her left ankle is in a boot.  1 + edema bilaterally.   DIAGNOSTIC DATA: White blood count currently is 5.1, hemoglobin 8.3, platelets of 300,000. Her white blood count on admission September 30 was 23.6. Renal function shows a creatinine of 1.0, BUN of 9. On admission her renal function showed a BUN of 57, creatinine 3.09. LFTs on admission showed albumin low at 2.4, total bilirubin 2.6, alkaline phosphatase 185, direct bilirubin 2.1, and AST 50. Most recent LFTs are relatively normal with alkaline phosphatase of 132, but her albumin is quite low at 1.4. Blood cultures October 6 are no growth to date. Blood cultures October 5, 1 of 2 grew Viridans strep and the other one grew coag-negative staph. Cultures on admission, September 30, 1 of 2 grew Enterobacter aerogenes sensitive to ceftriaxone, ciprofloxacin, gentamicin, ceftazidime, and levofloxacin. Urinalysis on admission September 30 had mixed bacteria, urinalysis had 84 white cells. Followup urinalysis October 10 showed 331 white cells.   IMAGING:  CT of her abdomen and pelvis without contrast showed findings worrisome for acute right-sided pyelonephritis caused by gas-producing organism with  scattered foci of subcutaneous emphysema within the superior pole of the right kidney. There were punctate nonobstructing left renal sided stones and there was a small amount of air in the urinary bladder. There were trace bilateral effusions, mild to  moderate, and mild gallbladder wall thickening. Ultrasound of the kidneys October 7 showed moderate right hydronephrosis with debris within the urinary collecting systems.   IMPRESSION: A 49 year old female with multiple medical problems, admitted with altered mental status, hypoxia, white count of 23,000, and fevers. She grew Enterobacter in blood cultures initially. She ended up having right-sided pyelonephritis with air in her kidney. 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 Followup blood cultures grew Viridans strep and coag-negative staph and urine culture grew Escherichia coli. Clinically she is much improved. She was treated initially with meropenem and is now on ertapenem. She is getting ready for discharge in the next day or so.   RECOMMENDATIONS:  1.  I would consolidate antibiotics and narrow to ceftriaxone. This can be given once a day for her renal pyelonephritis. This can be given through her PICC line. She will need weekly CBC with differential and comprehensive metabolic panel while she is on this. I would recommend 2 more weeks of ceftriaxone followed by oral antibiotics as needed. I can see her in clinic at the end of 2 weeks.  2.  Thank you for the consult. I will be glad to follow with you.    ____________________________ Stann Mainland. Sampson Goon, MD dpf:bu D: 10/16/2013 16:28:54 ET T: 10/16/2013 17:10:33 ET JOB#: [id number]  cc: Stann Mainland. Sampson Goon, MD, <Dictator> Trendon Zaring Sampson Goon MD ELECTRONICALLY SIGNED 10/17/2013 13:31

## 2014-04-28 NOTE — Consult Note (Signed)
PATIENT NAME:  [name] Haas, [name] Haas MR#:  [id number] DATE OF BIRTH:  Jan 22, 1965  DATE OF CONSULTATION:  10/15/2013  REFERRING PHYSICIAN:  Dr. Imogene Burn.  CONSULTING PHYSICIAN:  Christena Deem, MD.  REASON FOR CONSULTATION: GI bleed/heme-positive stool/anemia.   HISTORY OF PRESENT ILLNESS: [name] Haas is a 49 year old, Caucasian female with a fairly complex history during this hospitalization. She had been initially admitted on a previous hospitalization in March of 2015 for an ankle fracture. During her course of treatment, she developed a pulmonary embolus. In the interim, she had refused blood work. She did have some rectal bleeding and refused GI evaluation for that as well. She did have a history of some diarrhea.   She was then admitted to the hospital on 10/04/2013 for shortness of breath and confusion, found to be hypercapnic and hypoxic. This was felt to be possibly related to her medications. During her hospitalization, she did undergo ventilation due to acute respiratory failure. She states that she currently feels "close to her baseline." She is not yet very ambulatory. On evaluation of her hemoglobin, she has been found to have a slowly decreasing hemoglobin over the course of her hospitalization. She was checked and found to be Hemoccult positive. There is some degree of iron deficiency as well. There has been, however, no rectal bleeding grossly or hematochezia since her admission.   She had a colonoscopy on 09/28/2012 for problems of chronic diarrhea, found to be normal by both evaluation and biopsy. She has a history of an EGD being done 10/09/2010 that for dysphagia. She underwent empiric dilatation and currently denies any further problems along that line. There was some food noted in the stomach at that time. Over the course of her hospitalization, she did have an abdominal ultrasound on 10/03 showing only fatty liver. A HIDA scan was equivocal with apparently slow filling back-filling of the  gallbladder. She had a CT scan on 10/11/2013 due to abdominal pain finding a right pyelonephritis.   Prior to her hospitalization, she denied any problems with nausea, vomiting or abdominal pain. She currently has been having some right flank pain consistent with her diagnosis. She denies any heartburn or dysphagia, although she does take medication at home for reflux, Protonix 40 mg daily. However, she also takes [facility] arthritis strength powders, these being 1000 mg of aspirin at a time. She usually has a daily bowel movement. She denies any black stools. She has seen some red rectal bleeding at times and has felt that is related to her hemorrhoids. There is no history of peptic ulcer disease. Currently, she denies any nausea or vomiting. There is some right flank discomfort. She denies any black stools or bloody stools during this hospitalization. GI family history is pertinent for peptic ulcer disease with mother. There is no family history of colorectal cancer or liver disease.   PAST MEDICAL HISTORY: 1. As noted, history of pulmonary emboli associated with recent ankle fracture.  2. Iron-deficiency anemia.  3. Left ankle fracture.  4. Hypertension.  5. History of migraine headaches.  6. Schizophrenia.  7. Diabetes type 2.  8. Morbid obesity.  9. Hyperlipidemia.  10. Degenerative disk disease.  11. History of cervical cancer status post hysterectomy.  12. History of bilateral shoulder surgeries and left hand surgery. Indeed she has had 3 surgeries on her left shoulder and is supposed to be seeing her orthopedic surgeon for a repeat procedure on the left shoulder.  13. Chronic pain and chronic insomnia.   SOCIAL HISTORY: Former  smoker, quit about a year ago. She does not drink alcohol.   GASTROINTESTINAL FAMILY HISTORY: Negative for colorectal cancer, liver disease or ulcers.   PHYSICAL EXAMINATION: VITAL SIGNS: Temperature is 99.1, pulse 83, respirations 20, blood pressure 107/66, pulse  oximetry 98%. She is on 2 liters oxygen currently.  GENERAL: She is a 49 year old, Caucasian female, obese. No acute distress.  HEAD: Normocephalic, atraumatic.  EYES: Anicteric.  NOSE: Septum midline.  OROPHARYNX: No lesions.  NECK: No JVD.  HEART: Regular rate and rhythm.  LUNGS: Bilaterally clear.  ABDOMEN: Soft, nontender, nondistended. Bowel sounds are positive and normoactive.  RECTAL: Anorectal exam is deferred. Note previous Hemoccult positive.  EXTREMITIES: No clubbing or cyanosis. Trace edema.  NEUROLOGICAL: Cranial nerves 2 through 12 grossly intact. The patient finds it difficult to raise her left arm due to the problem she has had with her left shoulder.   ASSESSMENT: Anemia, iron deficiency, in the setting of Hemoccult-positive stool. The patient has been taking high-dose powdered aspirin products at home prior to admission and has apparently been doing this for quite some time. It is quite likely that she has had a chronic gastritis, although she has been taking Protonix on a regular basis. She has had some rectal bleeding in the setting of known history of hemorrhoids. Most recently, she did have a colonoscopy that was unremarkable about a year ago.   RECOMMENDATION: Would recommend an EGD. I am concerned, however, about her pulmonary status and will need to discuss further with primary doctor and consider pulmonary consult in regards to risk stratification for sedated procedure. In the interim, continue PPI as you are.      ____________________________ Christena DeemMartin U. Degan Hanser, MD mus:TT D: 10/15/2013 14:56:32 ET T: 10/15/2013 15:10:20 ET JOB#: [id number]  cc: Christena DeemMartin U. Glendora Clouatre, MD, <Dictator> Christena DeemMARTIN U Hazem Kenner MD ELECTRONICALLY SIGNED 10/24/2013 12:56

## 2014-04-28 NOTE — H&P (Signed)
PATIENT NAME:  [name] Haas, [name] Haas DATE OF BIRTH:  [birth year]  DATE OF ADMISSION:  12/05/2013  PRIMARY DOCTOR: [facility]. Patient referred by Dr. Marva Panda for direct admission.   HISTORY OF PRESENT ILLNESS: The patient is a 49 year old female patient, had an endoscopy for followup of ulcer. The patient developed an 8 beat run of ventricular tachycardia during the procedure.  Concerning this Dr. Marva Panda asked [place] to observe her overnight. The patient seen at the bedside, denies any chest pain, no trouble breathing. Complains of dizziness for a long time. The patient noted to be pale before the EGD and Dr. Marva Panda did blood work and found to have hemoglobin of 10 which is a drop from 11, which was a drop of 1 point from a week ago. The patient's blood pressure is also low at 95/57, she received 1 liter of normal saline bolus and she is getting a second liter of normal saline. The patient received propofol 1 dose for sedation and she received no further sedation.   ALLERGIES: SHE IS ALLERGIC TO PENICILLIN, CELEBREX, BEXTRA, VIOXX, RUBBING ALCOHOL, WELCHOL, AND SULFA DRUGS.    PAST MEDICAL HISTORY: Includes admission from September 30 to October 13, the patient was here because of right-sided pyelonephritis with UTI with Enterobacter bacteremia and the patient discharged to [facility] on October 13 and she was there at [facility] for 4 weeks and after that she was discharged home. The patient lives with her father. She is at home for the past 3 weeks.   PAST MEDICAL HISTORY:  She has other history of hypertension, pulmonary emboli, history of sleep apnea, obesity, upper GI bleed, iron deficiency anemia, history of left ankle fracture before, migraine headaches, schizophrenia, morbid obesity, diet-controlled diabetes, degenerative disk disease.   PAST SURGICAL HISTORY: Significant for cervical cancer status post hysterectomy, bilateral shoulder surgeries, left hand  surgery.   SOCIAL HISTORY: The patient right now lives with the father.  She was at [facility] for 4 weeks. The patient quit smoking.   FAMILY HISTORY: Significant for diabetes, hypertension, heart disease in the family. Mother has defibrillator. Father had a history of coronary artery disease and stent placement.   HOME MEDICATIONS:  1. Celexa 20 mg p.o. 2 tablets daily.   2. Abilify 30 mg p.o. daily.   3. Brisdelle 7.5 mg p.o. daily.  4. Flurazepam 15 mg 3 capsules daily.  5. Gabapentin 600 mg 2 tablets daily.  6. Myrbetriq 50 mg p.o. at 4:00 p.m.  7. Seroquel 300 milligrams p.o. daily.  8. Dicyclomine 20 mg p.o. t.i.d.  9. Pantoprazole 40 mg daily.  10. The patient recently finished ceftriaxone for UTI.   11. She is also on atorvastatin 10 mg p.o. daily.   REVIEW OF SYSTEMS:  CONSTITUTIONAL:  She denies any headache or fever.   GASTROINTESTINAL: No nausea. Complains of mild abdominal pain. The patient also complains of some rectal bleed, stool mixed with blood over the weekend.   RESPIRATORY:  No trouble breathing.  CARDIOVASCULAR: No chest pain or palpitations.  NEUROLOGIC: No history of strokes.  PSYCHIATRIC: History of schizophrenia.  GENITOURINARY: No dysuria.   PHYSICAL EXAMINATION:   VITAL SIGNS:  Temperature 98 Fahrenheit, heart rate about 80 beats per minute, blood pressure initially it was 89/59 and repeat blood pressure 95/57, saturation is 100% on room air.   GENERAL:  Alert, awake, oriented female answering questions appropriately.  HEAD: Normocephalic, atraumatic.  EYES: Pupils equal, reacting to light. Extraocular movements are  intact.  EARS, NOSE, AND THROAT: No tympanic membrane congestion. No turbinate hypertrophy. No oropharyngeal erythema.  NECK: Supple. No JVD. No carotid bruit.  CARDIOVASCULAR: S1, S2 regular. No murmurs. No gallops. LUNGS: Clear to auscultation. No wheeze. No rales. The patient not using accessory muscles of respiration.   ABDOMEN: Soft, obese. Bowel sounds present. No organomegaly. No hernias.  EXTREMITIES: The patient has no extremity edema. No cyanosis, no clubbing.  PSYCHIATRIC: The patient is slow to respond, but denies any anxiety at this time.  NEUROLOGIC: Cranial nerves II through XII intact. Power 5 out of 5 upper and lower extremities.  Sensory intact.  DTRs 2+ bilaterally.   LABORATORY DATA:  hemoglobin 10.3, hematocrit 32.3, platelets 228,000. INR is 1. Electrolytes, sodium 139, potassium 4.8, chloride 107, bicarbonate 27, BUN 15, creatinine 1.45, glucose 115. The patient's magnesium and phosphorus are not checked.    EKG strip showed 8 beat run of ventricular tachycardia, after that she is in normal sinus rhythm at 80 beats per minute.   ASSESSMENT AND PLAN:   1.  This is a 49 year old female patient with nonsustained ventricular tachycardia which she developed during endoscopy. She is normal sinus rhythm.  We are going to keep her   in overnight observation status, check her magnesium and phosphorus levels, obtain echocardiogram and cardiology evaluation.  2.  Acute renal failure. The patient's kidney function shows creatinine of 1.45 and it was normal on November 20.  The patient will get gentle hydration.  3.  Hypotension and acute renal failure, acute renal failure likely due to acute tubular necrosis with hypotension. Continue IV fluids and monitor creatinine and renal function.  4.  Anemia. The patient has acute on chronic blood loss anemia. Dr. Marva PandaSkulskie is following on that. No indication for blood transfusion at this time, monitor closely.  5.  History of gastric ulcer. Repeat endoscopy showed healed ulcer, continue Protonix 40 p.o. daily.  6.  History of schizophrenia, depression. Continue Celexa, Abilify, flurazepam, and Seroquel. 7.  Hyperlipidemia. Continue atorvastatin 10 mg p.o. daily.  8.  Overactive bladder. She is on Myrbetriq 50 mg at 4:00 p.m., continue that.   TIME SPENT: About 55  minutes.   Discussed the plan with patient and the patient's father.     ____________________________ Katha HammingSnehalatha Juanya Villavicencio, MD sk:bu D: 12/05/2013 11:28:00 ET T: 12/05/2013 13:45:46 ET JOB#: [id number]  cc: Katha HammingSnehalatha Jonas Goh, MD, <Dictator> Katha HammingSNEHALATHA Lashuna Tamashiro MD ELECTRONICALLY SIGNED 12/30/2013 19:13

## 2014-04-28 NOTE — Consult Note (Signed)
Chief Complaint:  Subjective/Chief Complaint admitted after EGD for possible arrythmia.  No recurrence, feeling more alert, no abdominal pain or nausea.   VITAL SIGNS/ANCILLARY NOTES: **Vital Signs.:   02-Dec-15 12:12  Vital Signs Type Routine  Temperature Temperature (F) 97.5  Celsius 36.3  Temperature Source oral  Pulse Pulse 69  Respirations Respirations 18  Systolic BP Systolic BP 034  Diastolic BP (mmHg) Diastolic BP (mmHg) 90  Mean BP 108  Pulse Ox % Pulse Ox % 92  Pulse Ox Activity Level  At rest  Oxygen Delivery Room Air/ 21 %   Brief Assessment:  GEN obese   Cardiac Regular   Respiratory clear BS   Gastrointestinal details normal Soft  Nontender  Bowel sounds normal  minimal epigastric discomfort.   Lab Results: Cardiology:  02-Dec-15 11:38   Ventricular Rate 76  Atrial Rate 76  P-R Interval 180  QRS Duration 86  QT 418  QTc 470  P Axis 40  R Axis 38  T Axis 36  ECG interpretation Normal sinus rhythm Normal ECG Nonspecific ST and T wave abnormality V1&V2  When compared with ECG of 05-Dec-2013 11:48, No significant change was found Confirmed by Glenetta Hew ([id number]) on 12/06/2013 1:31:56 PM  Overreader: Glenetta Hew  Routine Chem:  02-Dec-15 04:05   Hemoglobin A1c ([facility]) 4.9 (The American Diabetes Association recommends that a primary goal of therapy should be <7% and that physicians should reevaluate the treatment regimen in patients with HbA1c values consistently >8%.)  Glucose, Serum 91  BUN 12  Creatinine (comp) 1.16  Sodium, Serum 141  Potassium, Serum 4.6  Chloride, Serum  108  CO2, Serum 29  Calcium (Total), Serum  8.4  Anion Gap  4  Osmolality (calc) 281  eGFR (African American) >60  eGFR (Non-African American)  53 (eGFR values <51m/min/1.73 m2 may be an indication of chronic kidney disease (CKD). Calculated eGFR, using the MRDR Study equation, is useful in  patients with stable renal function. The eGFR calculation will not be  reliable in acutely ill patients when serum creatinine is changing rapidly. It is not useful in patients on dialysis. The eGFR calculation may not be applicable to patients at the low and high extremes of body sizes, pregnant women, and vegetarians.)  Routine Hem:  02-Dec-15 04:05   WBC (CBC) 4.1  RBC (CBC)  3.24  Hemoglobin (CBC)  9.2  Hematocrit (CBC)  29.2  Platelet Count (CBC) 180  MCV 90  MCH 28.5  MCHC  31.6  RDW  21.3  Neutrophil % 63.4  Lymphocyte % 26.6  Monocyte % 5.7  Eosinophil % 3.9  Basophil % 0.4  Neutrophil # 2.6  Lymphocyte # 1.1  Monocyte # 0.2  Eosinophil # 0.2  Basophil # 0.0 (Result(s) reported on 06 Dec 2013 at 04:59AM.)   Assessment/Plan:  Assessment/Plan:  Assessment 1) h/o GU-much improved/resolved on repeat egd.  Continues with gastritis, possibly related to continued use of feldene.  Recommend avoiding ALL nsaids/asa, continue protonix.   Plan 1) as above, GI o/p fu in 3 weeks.   Electronic Signatures: SLoistine Simas(MD)  (Signed 02-Dec-15 15:22)  Authored: Chief Complaint, VITAL SIGNS/ANCILLARY NOTES, Brief Assessment, Lab Results, Assessment/Plan   Last Updated: 02-Dec-15 15:22 by SLoistine Simas(MD)

## 2014-04-28 NOTE — Consult Note (Signed)
PATIENT NAME:  [name] Haas, [name] Haas MR#:  [id number] DATE OF BIRTH:  [birth year]  DATE OF CONSULTATION:  12/05/2013  REASON FOR CONSULTATION: Supraventricular tachycardia and hypotension.   CONSULTING PHYSICIAN:  Katha HammingSnehalatha Konidena, MD.  CHIEF COMPLAINT: "I'm weak."   HISTORY OF PRESENT ILLNESS:  This is Haas middle-aged female with recent concerns of significant anemia and possible bleeding for which the patient has had extensive workup for gastroenterology issues. The patient was being prepped for gastritic assessment for which she has had some issues dehydration, with chronic kidney disease and Haas GFR of Haas 49, as well as some hypotension and weakness. The patient was given intravenous fluids for which had helped some with the hypotension, but then had Haas short run of wide-complex tachycardia consistent with supraventricular tachycardia with aberrancy, although will further evaluate and reassess if with other   if there is Haas recurrence.  The patient has had no recurrence since on telemetry and has had no evidence of chest pain or other symptoms, consistent with heart failure. The patient does have recent concerns of shortness of breath with physical activity, relieved by rest over the last several months after an injury to her foot and has had decreased exercise tolerance, but no apparent true angina. Remainder review of systems negative for vision change, ringing in the ears, hearing loss, cough, congestion, stomach pain, extremity pain other than her foot pain, frequent urination, urination at night, muscle weakness, numbness, anxiety, depression, skin lesions or skin rashes.   PAST MEDICAL HISTORY: 1.  Chronic kidney disease. 2.  Hyperlipidemia.   FAMILY HISTORY: No family members with early onset of cardiovascular disease or hypertension.   SOCIAL HISTORY: She currently denies alcohol or tobacco use.   ALLERGIES: AS LISTED.   MEDICATIONS: As listed.   PHYSICAL EXAMINATION: VITAL SIGNS: Blood  pressure is 102/68 bilaterally. Heart rate is 70, upright, reclining, and regular.  GENERAL: She is Haas well-appearing female in no acute distress.  HEENT: No icterus, thyromegaly, ulcers, hemorrhage, or xanthelasma.  CARDIOVASCULAR: Regular rate and rhythm. Normal S1 and S2. No apparent significant murmur, gallop, or rub. PMI is diffuse. Carotid upstroke normal. Jugular venous pressure not seen.  LUNGS: Have few basilar crackles with normal respirations.  ABDOMEN: Soft, nontender without hepatosplenomegaly or masses, but some increased abdominal girth.  EXTREMITIES: Showed 2+ radial, 1+ femoral, trace dorsal pedal pulses, with 1+ lower extremity edema. No cyanosis, clubbing or ulcers.  NEUROLOGIC: She is oriented to time, place, and person, with normal mood and affect.   ASSESSMENT: This is middle-aged female with Haas history of mixed hyperlipidemia, chronic kidney disease stage III with dehydration and potential electrolyte abnormalities causing hypotension with supraventricular tachycardia with wide complex and no current evidence of myocardial infarction, congestive heart failure.   RECOMMENDATIONS: 1.  Continue hydration, following for improvements of chronic kidney disease or acute renal failure.  2. Consider echocardiogram for LV systolic dysfunction, valvular heart disease causing tachycardia.  3.  Continue telemetry and follow closely for further episodes of tachycardia requiring further intervention including medication management, although currently would abstain due to concerns of hypotension.  4.  Treatment options for anemia as per gastroenterology.  5.  Begin ambulation and follow for any further significant symptoms requiring other additional diagnostics.   ____________________________ Lamar BlinksBruce J. Kowalski, MD bjk:DT D: 12/05/2013 13:19:03 ET T: 12/05/2013 14:01:32 ET JOB#: [id number]  cc: Lamar BlinksBruce J. Kowalski, MD, <Dictator> Lamar BlinksBRUCE J KOWALSKI MD ELECTRONICALLY SIGNED 12/08/2013 9:03

## 2014-04-28 NOTE — Consult Note (Signed)
Chief Complaint:  Subjective/Chief Complaint feeling good today.  seen for anemia and heme positive stool in the setting of asa/nsaid use.  no shortness of breath.   VITAL SIGNS/ANCILLARY NOTES: **Vital Signs.:   12-Oct-15 04:43  Vital Signs Type Routine  Temperature Temperature (F) 97.7  Celsius 36.5  Pulse Pulse 69  Respirations Respirations 20  Systolic BP Systolic BP 108  Diastolic BP (mmHg) Diastolic BP (mmHg) 72  Mean BP 84  Pulse Ox % Pulse Ox % 93  Pulse Ox Activity Level  At rest  Oxygen Delivery 2L   Brief Assessment:  Cardiac Regular   Respiratory clear BS   Gastrointestinal details normal Soft  Nontender  Nondistended  Bowel sounds normal   Lab Results: Routine Chem:  12-Oct-15 04:30   Result Comment LABS - This specimen was collected through an   - indwelling catheter or arterial line.  - A minimum of of blood was wasted prior    - to collecting the sample.  Interpret  - results with caution.  Result(s) reported on 16 Oct 2013 at 04:56AM.  Magnesium, Serum 2.0 (1.8-2.4 THERAPEUTIC RANGE: 4-7 mg/dL TOXIC: > 10 mg/dL  -----------------------)  Potassium, Serum  3.2 (Result(s) reported on 16 Oct 2013 at 04:56AM.)  Routine UA:  30-Sep-15 15:51   Color (UA) Amber  Clarity (UA) Cloudy  Glucose (UA) Negative  Bilirubin (UA) Negative  Ketones (UA) Negative  Specific Gravity (UA) 1.018  Blood (UA) 2+  pH (UA) 5.0  Protein (UA) 100 mg/dL  Nitrite (UA) Negative  Leukocyte Esterase (UA) 3+ (Result(s) reported on 04 Oct 2013 at 04:33PM.)  RBC (UA) 7 /HPF  WBC (UA) 84 /HPF  Bacteria (UA) 3+  Epithelial Cells (UA) 4 /HPF  WBC Clump (UA) PRESENT  Mucous (UA) PRESENT  Budding Yeast (UA) PRESENT  Hyaline Cast (UA) 6 /LPF (Result(s) reported on 04 Oct 2013 at 04:33PM.)  03-Oct-15 16:30   Color (UA) Public affairs consultant (UA) Hazy  Glucose (UA) Negative  Bilirubin (UA) Negative  Ketones (UA) Negative  Specific Gravity (UA) 1.014  Blood (UA) 3+  pH (UA) 5.0   Protein (UA) 30 mg/dL  Nitrite (UA) Negative  Leukocyte Esterase (UA) 1+ (Result(s) reported on 07 Oct 2013 at 04:45PM.)  RBC (UA) 114 /HPF  WBC (UA) 22 /HPF  Bacteria (UA) NONE SEEN  Epithelial Cells (UA) 4 /HPF  WBC Clump (UA) PRESENT  Mucous (UA) PRESENT (Result(s) reported on 07 Oct 2013 at 04:45PM.)  10-Oct-15 15:05   Color (UA) Yellow  Clarity (UA) Cloudy  Glucose (UA) Negative  Bilirubin (UA) Negative  Ketones (UA) Negative  Specific Gravity (UA) 1.004  Blood (UA) 2+  pH (UA) 7.0  Protein (UA) 30 mg/dL  Nitrite (UA) Negative  Leukocyte Esterase (UA) 3+ (Result(s) reported on 14 Oct 2013 at 03:23PM.)  RBC (UA) 17 /HPF  WBC (UA) 331 /HPF  Bacteria (UA) 1+  Epithelial Cells (UA) 1 /HPF  WBC Clump (UA) PRESENT  Mucous (UA) PRESENT (Result(s) reported on 14 Oct 2013 at 03:23PM.)  Routine Sero:  10-Oct-15 10:12   Occult Blood, Feces POSITIVE (Result(s) reported on 14 Oct 2013 at 10:37AM.)  Routine Hem:  12-Oct-15 04:30   WBC (CBC) 5.1  RBC (CBC)  3.16  Hemoglobin (CBC)  8.3  Hematocrit (CBC)  25.7  Platelet Count (CBC) 300  MCV 81  MCH 26.1  MCHC 32.1  RDW  16.3  Neutrophil % 68.5  Lymphocyte % 20.5  Monocyte % 8.2  Eosinophil % 1.8  Basophil % 1.0  Neutrophil # 3.5  Lymphocyte # 1.0  Monocyte # 0.4  Eosinophil # 0.1  Basophil # 0.1 (Result(s) reported on 16 Oct 2013 at 04:48AM.)   Assessment/Plan:  Assessment/Plan:  Assessment 1) anemia, heme positive stool, frequent asa/nsaid at home chronically.  patient had colonoscopy about one year ago-diverticulosis and internal hemorrhoids.  2) pyelonephritis, sepsis, respiratory failure-improved   Plan 1) egd- will arrange for tomorrow pending pulmonary clearance. I have discussed the risks benefits and complications of egd to include not limited to bleeding infection perforationa dn sedation and she wishes to proceed.  continue ppi.   Electronic Signatures: Barnetta ChapelSkulskie, Martin (MD)  (Signed 12-Oct-15  14:38)  Authored: Chief Complaint, VITAL SIGNS/ANCILLARY NOTES, Brief Assessment, Lab Results, Assessment/Plan   Last Updated: 12-Oct-15 14:38 by Barnetta ChapelSkulskie, Martin (MD)

## 2014-04-28 NOTE — Consult Note (Signed)
PATIENT NAME:  [name] Haas, [name] Haas MR#:  [id number] DATE OF BIRTH:  July 15, 1965  DATE OF CONSULTATION:  10/09/2013  CONSULTING PHYSICIAN:  Audery AmelJohn T. Clapacs, MD  IDENTIFYING INFORMATION AND REASON FOR CONSULT: A 49 year old woman with a history of schizoaffective disorder who is in the hospital because of a sudden episode of difficulty breathing with loss of consciousness. Consultation for psychiatric medicine.   HISTORY OF PRESENT ILLNESS: The patient is well-known to me. I have been seeing her in the office for a couple of years. Information obtained from the patient and the chart. The patient tells me that she remembers becoming suddenly short of breath like she could not breathe at all. This, she said happened out of the blue. She had not been feeling sick or having any trouble breathing up until that point. She knows that her father then brought her into the hospital. She denies that she had been overusing any of her medicines are had taken any extra medicines or drugs. As far as her psychiatric problems, she said that recently her psychotic symptoms been under pretty good control. Her typical psychotic symptoms that I am very familiar with is a delusional belief that a certain unnamed man from [place] can somehow invisibly reach into her body and sexually assault her and that she can feel this. This symptom tends to recur if she is off of her medication or under times of high stress. She says she has not been having any of those symptoms recently and that her mood has been pretty good. She said she had been compliant with her current combination of medicines. Now that she has woken up and been extubated, she denies that she is having any of the hallucinations or feeling any of those delusional feelings and says that her mood is feeling good. Denies depression. Denies suicidal or homicidal ideation.   PAST PSYCHIATRIC HISTORY: The patient has a very long history of mood instability with accompanying psychotic  symptoms. In the time I have known her, the psychotic symptoms have been the dominant element. I have not had to hospitalize her in the time that she has been [place]. She has not made any suicide attempts. In times past, she had been on high doses of benzodiazepines. I was able to gradually wean her off of most of those except for a little bit of sleeping medicine. I have not usually thought of her as having a substance abuse problem.   SOCIAL HISTORY: The patient lives with her father. As near as I can tell their relationship is good and supportive. I have not heard her ever issue any particular complaints about him.   FAMILY HISTORY: Positive for some mental illness, unspecified.   SUBSTANCE ABUSE HISTORY: The patient does not report to me that she has ever had a significant problem with substance abuse in the past.   REVIEW OF SYSTEMS: Feeling tired. Still slightly short of breath. Not nearly as bad as before. No current GI complaints. No cardiac complaints. Mood is described as okay. She denies being depressed. Denies any suicidal ideation. Denies any hallucinations. Otherwise a full review of systems negative.   MENTAL STATUS EXAMINATION: Disheveled woman, looks older than her stated age, as she usually does to me. Interviewed in the critical care unit. She was awake and recognized me immediately. Eye contact was good. Psychomotor activity slow. Speech quiet, decreased in total amount, but that is pretty typical for her. Affect slightly constricted again fairly typical. Mood stated  as fine. Thoughts appeared to be slow and a little impoverished, but not to a severe degree and again I would say pretty much how she usually is when I talk with her. Denies suicidal or homicidal ideation. Denies any hallucinations. Not having any of her delusional symptoms. Able to remember 3/3 objects immediately, 3/3 at 2 minutes. Alert and oriented x 4. Normal fund of knowledge.   PAST  MEDICAL HISTORY: The patient has a history of blood clots in the past, high blood pressure, some chronic pain in her extremities.   CURRENT MEDICATIONS: Abilify 30 mg a day, atorvastatin 10 mg a day, Bentyl 20 mg q.a.m. hours, Lexapro 40 mg a day, famotidine 20 mg twice a day, gabapentin 600 mg at night, levofloxacin 750 mg IV q. 24 hours, Xarelto 20 mg at night, quetiapine 300 mg at night, chlorhexidine, morphine p.r.n.   ALLERGIES: VIOXX, BEXTRA, CELEBREX, PENICILLIN, ROFECOXIB, SULFA DRUGS.   ASSESSMENT: A 49 year old woman who came in short of breath. I am not sure that it has really been clearly determine what the cause of it was. She required intubation briefly, but has now been successfully extubated. Feeling much better. The patient denies to me that she had overdosed on any of her medicines. I had been familiar with her taking her Seroquel and Abilify together without any oversedation in the past. I doubt that that is the problem right now unless she had overdosed on something. I think it would be appropriate to try returning her to the combination of medicines I had her on before to maintain her psychiatric stability.   TREATMENT PLAN: Restart the Seroquel 300 mg at night, which had been suspended. Continue the Abilify and Lexapro. Supportive counseling done with the patient. I will follow up with her tomorrow and until she goes home and then I will continue to see her as an outpatient.   DIAGNOSIS, PRINCIPAL AND PRIMARY:   AXIS I: Schizoaffective disorder, depressed type.   SECONDARY DIAGNOSES:  AXIS I: Deferred.   AXIS II: Deferred.   AXIS III: Recent acute respiratory failure, chronic pain.    ____________________________ Audery Amel, MD jtc:lt D: 10/09/2013 17:39:33 ET T: 10/09/2013 18:05:39 ET JOB#: [id number]  cc: Audery Amel, MD, <Dictator> Audery Amel MD ELECTRONICALLY SIGNED 10/16/2013 0:23

## 2014-04-28 NOTE — Consult Note (Signed)
Brief Consult Note: Diagnosis: PE.   Patient was seen by consultant.   Comments: Patientpresented hypoxic, hypertensive after fall, noted to have L ankle fx, Dopplers are negative fro  DVT, CT positive for PE, remains on 4 liters of O2, on heparin IV now, ortho recommends medical management of L ankle fx fro niow until hypoxia resolves,  pt is hyperactive, nurses are c/o pt being restless, wantdering about resumptiopn of home schizophrenia meds Would recommend Coumadin as opposed to Xarelto for longterm management as patient has issues in terms of medical compliance and concern for falls with schizophrenia.  Electronic Signatures: Antony Hasteamiah, Floy Riegler S (MD)  (Signed 23-Mar-15 15:01)  Authored: Brief Consult Note   Last Updated: 23-Mar-15 15:01 by Antony Hasteamiah, Mariela Rex S (MD)

## 2014-04-28 NOTE — Consult Note (Signed)
Brief Consult Note: Diagnosis: Abnormal EKG, NSR 78/min non-specific st and t changes.   Patient was seen by consultant.   Consult note dictated.   Recommend to proceed with surgery or procedure.   Discussed with Attending MD.  Electronic Signatures: Radene KneeKhan, Alessa Mazur Ali (MD)  (Signed 22-Mar-15 10:48)  Authored: Brief Consult Note   Last Updated: 22-Mar-15 10:48 by Radene KneeKhan, Toran Murch Ali (MD)

## 2014-04-28 NOTE — Discharge Summary (Signed)
PATIENT NAME:  [name] Haas, [name] Haas MR#:  [id number] DATE OF BIRTH:  07/14/1965  DATE OF ADMISSION:  03/25/2013 DATE OF DISCHARGE:  03/29/2013   ADMITTING DIAGNOSIS: Pulmonary embolus.   DISCHARGE DIAGNOSES:  1.  Bilateral pulmonary embolism. Acute respiratory failure due to pulmonary embolism. No lower extremity deep vein thrombosis noted on Doppler ultrasound. Now on Coumadin therapy with recent pro time of 31.5 and INR of 3.2 on the 25th of March 2015.  2.  Iron deficiency anemia, of unclear etiology at this time.  3.  Left ankle fracture, status post reduction and short leg cast application on the 23rd of March 2015 by Dr. Deeann SaintHoward Miller.  4.  History of hypertension.  5.  Migraine headaches.  6.  Schizophrenia.  7.  Diabetes mellitus with hemoglobin A1c 8.3.  8.  Obesity.  9.  Hyperlipidemia. 10.  Degenerative disk disease.  11.  Chronic pain.  12.  Chronic insomnia.  13.  History of cervical cancer, status post hysterectomy.  14.  History of bilateral shoulder surgeries and left hand surgery.   DISCHARGE CONDITION: Stable.   DISCHARGE MEDICATIONS: The patient is to continue:  1.  Atorvastatin 10 mg p.o. daily. 2.  Venlafaxine 75 mg 2-1/2 tablets orally twice daily. 3.  Pantoprazole 20 mg 2 tablets (which would be 40 mg) once daily. 4.  Escitalopram 20 mg 2 tablets (which would be 40 mg) once daily.  5.  Abilify 30 mg 1 tablet once daily.  6.  Brisdelle 7.5 mg capsule once at bedtime.  7.  Fioricet 1 tablet once daily as needed, maximum 2 capsules a day.  8.  Dicyclomine 20 mg p.o. 3 times daily.  9.  Flurazepam 15 mg 3 capsules once at bedtime.  10.  Gabapentin 600 mg 2 tablets (1200 mg) once at bedtime.  11.  Hydroxyzine pamoate 50 mg 1 tablet twice daily as needed.  12.  Methocarbamol 750 mg twice daily.  13.  Myrbetriq 50 mg p.o. daily.  14.  Seroquel 300 mg p.o. at bedtime.  15.  Acetaminophen/hydrocodone 325/7.5 mg 1 tablet every 4 hours as needed.  16.  Ramipril 5 mg p.o.  daily.  17.  Warfarin 2 mg p.o. daily.  18.  Glimepiride 4 mg p.o. daily.  19.  Albuterol/ipratropium 2.5/0.5 mg in 3 mL inhalation solution, 1 inhalation 6 times daily as needed.  20.  Iron sulfate 325 mg twice daily with meals.   The patient is not to take diclofenac or aspirin.   HOME OXYGEN: With portable tank at 2 liters of oxygen through nasal cannula. The patient should be weaned off to room air as tolerated.   DIET: 2 gram salt, low-fat, low-cholesterol, carbohydrate-controlled diet. Regular consistency.   ACTIVITY LIMITATIONS: As tolerated.   REFERRALS: To physical therapy, nonweightbearing of left leg.   FOLLOWUP APPOINTMENTS: With Dr. Wendie Simmeramiah or Dr. Orlie DakinFinnegan in 1 to 2 days after discharge and Dr. Deeann SaintHoward Miller in 1 week after discharge.   OTHER COMMENTS: Continue to elevate left leg.   CONSULTANTS: Dr. Martha ClanKrasinski, Dr. Shelle Ironein, Dr. Belia HemanKasa, Dr. Adrian BlackwaterShaukat Khan, Dr. Deeann SaintHoward Miller, Dr. Wendie Simmeramiah.   RADIOLOGIC STUDIES: Chest portable single view, 21st of March 2015, revealed no acute cardiopulmonary abnormality. Left ankle AP and lateral, 21st of March 2015, showed trimalleolar fracture-dislocation of the ankle. Ankle left AP and lateral,  21st of March 2015, post reduction with splint on: Reduction of talar dislocation status post casting and immobilization of left ankle. Left ankle complete x-ray, 23rd of March 2015,  showed trimalleolar fracture with mild displacement of fracture fragments and mild residual tibiotalar subluxation. CT angiography for pulmonary embolism, 21st of March 2015, showing positive for bilateral pulmonary embolism. Hepatic steatosis was also incidentally noted. Bilateral lower extremity Dopplers, 22nd of March 2015, revealed no evidence of deep vein thrombosis in either lower extremity. Left leg calf veins could not be evaluated due to cast and bandages from the left ankle fracture.   HISTORY OF PRESENT ILLNESS: The patient is a 49 year old Caucasian female with past  medical history significant for multiple medical problems, who presented to the hospital after fall and fracture of left ankle. Please refer to Dr. Judithann Sheen' admission note on the 21st of March 2015. She was noted to be hypertensive, also hyperglycemic and very short of breath/hypoxic, requiring 4 liters of oxygen through nasal cannula. She underwent CT scanning with IV contrast and was noted to have pulmonary embolism and was admitted to the hospital. Her vital signs on admission revealed blood pressure of 152/82, heart rate was 79, respiration rate was 24, and temperature was 98.3. Physical exam revealed scattered rhonchi without wheezes or rales, no dullness. Respiratory effort was normal. Otherwise, no other significant abnormalities were noted,  except her lower extremities revealed 1+ lower extremity edema. Pedal pulses were palpable, 2+. EKG showed sinus rhythm with anterior ST depressions as well as T wave inversions. Chest x-ray was normal. The patient's lab data done on admission showed a sodium level of 131, glucose level of 346; otherwise, BMP was unremarkable. Hemoglobin A1c was checked and was found to be 8.3. The patient's iron level was also found to be low. Hemoglobin level was only 7.9 and MCV was low at 72; otherwise, CBC was unremarkable. The patient's troponin was less than 0.02. TSH was normal at 1.77. Iron, as mentioned above, was found to be low with iron level of 29, iron saturation of only 7.   The patient was admitted to the hospital for further evaluation. She was transfused 1 unit of packed red blood cells, after which her hemoglobin level improved. She had a vitamin B12 level checked, which was found to be 452 (within normal limits), and was started on anticoagulation with Lovenox and Coumadin. Consultation with Dr. Wendie Simmer was obtained. Dr. Wendie Simmer saw  the patient in consultation the 23rd of March 2015 and recommended Coumadin therapy as opposed to Xarelto for long-term management of  this patient, since the patient had a history of medical noncompliance and concerns of falls with schizophrenia. Dr. Wendie Simmer ordered also hypercoagulable work-up. Anticardiolipin antibodies, IgA, IgM, as well as IgG were obtained, and anticardiolipin antibodies were found to be high. IgM was found to be high. Antiphospholipid syndrome was also checked and was found to be high in IgM. Factor II mutation analysis: No mutation was identified. Protein S and C panel revealed protein C antigen 90%, protein S 118%, and protein S3 was 85%, which were all within normal limits. It was felt that the patient should follow up with hematologist/oncologist for recommendations in regards to management of her pulmonary embolism as well as possible hypercoagulable state. The patient's Coumadin was loaded, and her Coumadin level reached a therapeutic level today on the 25th of March 2015. Her pro time was 31.5 and INR was 3.2. It is recommended to follow the patient's pro time levels closely and adjust the Coumadin dosing depending on the patient's needs, keeping the patient's INR between 2 and 3.   In regards to left ankle fracture, as mentioned above, the patient  had reduction as well as short leg cast application. Dr. Deeann Saint did not feel that patient is a candidate for surgical therapy at this time. He recommended to continue pain management as well as physical therapy and follow up with him in the next few weeks after discharge. Nonweightbearing status for left leg and left leg elevation was recommended by surgery.  In regards to iron deficiency anemia, as mentioned above, the patient was transfused 1 unit of packed red blood cells. However, she was not noted to be bleeding. She was evaluated by gastroenterologist, Dr. Shelle Iron, who did not feel that patient needs to have any kind of procedures done. He recommended to start the patient on iron sulfate twice a day and then follow up with GI clinic in approximately 1 month for  iron studies as well as consideration for EGD and capsule endoscopy. He recommended to continue anticoagulation despite iron deficiency anemia.   Regarding history of hypertension, migraine headaches, schizophrenia, diabetes mellitus, obesity, hyperlipidemia, the patient is to continue her outpatient management.   The patient is being discharged in stable condition with the above-mentioned medications and followup.   Her vital signs on the day of discharge: Temperature was 98.1. Pulse was ranging from 99 to 106. Her respiratory rate was 18, blood pressure 122/87. Saturation was 94% on 2 liters of oxygen through nasal cannula and would go down to 90% on room air. The patient's oxygen should be slowly discontinued depending on her ability.   TIME SPENT: 40 minutes.   ____________________________ Katharina Caper, MD rv:jcm D: 03/29/2013 15:34:12 ET T: 03/29/2013 16:21:18 ET JOB#: [id number]  cc: Katharina Caper, MD, <Dictator> Brandalyn Harting MD ELECTRONICALLY SIGNED 04/11/2013 21:14

## 2014-04-28 NOTE — H&P (Signed)
PATIENT NAME:  [name] Haas, [name] Haas MR#:  [id number] DATE OF BIRTH:  1965-07-09  DATE OF ADMISSION:  10/04/2013  PRIMARY CARE PROVIDER: None. Her primary care provider, according to her father, is no longer practicing. She was last seen in the Oncology Clinic.   CHIEF COMPLAINT: Shortness of breath, confusion.   HISTORY OF PRESENT ILLNESS: The patient is a 49 year old white female who was last hospitalized here from 03/25/2013 to 03/29/2013 with the diagnosis of ankle fracture. She underwent reduction and short-leg cast application. During that hospitalization, she was also noticed to have bilateral pulmonary embolism, and was discharged home on Coumadin therapy. The patient was on therapy until last month and I reviewed the notes from the Oncology Clinic. According to them, the patient refused to get her blood work checked for INR, so Coumadin was discontinued. The patient also was having rectal bleeding at that time, and she refused to be seen by a GI doctor. The patient apparently was doing okay up until about Monday when, according to her father who lives with her, she started becoming confused, not making any sense, was unsteady on her feet, and also started having shortness of breath. The patient finally came to the ED because her confusion became worse and she was more short of breath. The patient upon arrival to the ED was noted to have oxygen saturation of 78% and was very hypoxic. The patient currently is lethargic and able to answer some questions, but a very poor historian. Her father is at the bedside. He reports that she also has had intermittent diarrhea ongoing for the past few months. The patient also noted to have a creatinine that is very elevated; she has not been eating or drinking much.   PAST MEDICAL HISTORY:  1.  History of bilateral pulmonary embolism associated with the ankle fracture.  2.  Iron deficiency anemia.  3.  Left ankle fracture; she still has a boot on and is not much  ambulatory.  4.  Hypertension.  5.  History of migraine headaches.  6.  Schizophrenia.  7.  Diabetes with hemoglobin A1c of 8.3 noted during previous hospitalization.  8.  Morbid obesity.  9.  Hyperlipidemia.  10.  Degenerative disk disease.  11.  Chronic pain.  12.  Chronic insomnia.  13.  History of cervical cancer, status post hysterectomy.  14.  History of bilateral shoulder surgeries and left hand surgeries.   ALLERGIES: TO MULTIPLE MEDICATIONS, INCLUDING VIOXX, BEXTRA, CELEBREX, PENICILLIN, ROFECOXIB, RUBBING ALCOHOL WIPES, SULFA DRUGS.   SOCIAL HISTORY: Used to smoke; no longer is smoking and quit smoking about a year ago.   FAMILY HISTORY: Positive for diabetes, hypertension, and stroke. Negative for colon cancer.   MEDICATIONS: According to the father, medications are unchanged compared to the clinic visit done last in August: Lexapro 40 mg daily, venlafaxine 75 mg (Dictation Anomaly)p.o. b.i.d., Protonix 40 daily, lisinopril 10 daily, Abilify 30 mg daily, Brisdelle 7.5 mg daily, acetaminophen butalbital caffeine 1 tablet p.o. daily as needed, flurazepam 45 mg at bedtime, gabapentin 600 mg 2 tablets at bedtime, hydroxyzine 50 mg 1 tablet p.o. b.i.d. as needed, methocarbamol 750 mg q. 12 hours, Myrbetriq 50 mg 1 tablet p.o. daily, quetiapine 300 mg at bedtime, atorvastatin 10 at bedtime.   REVIEW OF SYSTEMS: Unobtainable, due to patient being sleepy and lethargic.   PHYSICAL EXAMINATION:  VITAL SIGNS: Temperature 98.2, pulse 102, respirations 22, blood pressure 149/111, oxygen saturation 78% on room air.  GENERAL: The patient is a critically ill-appearing female,  lethargic.  HEENT: Head atraumatic, normocephalic. Pupils equally round, reactive to light and accommodation. There is no conjunctival pallor. No scleral icterus. Nasal exam shows no drainage or ulceration. External ear exam shows no erythema or drainage.  OROPHARYNX: Very dry.  NECK: Supple, without any JVD or  thyromegaly.  CARDIOVASCULAR: Regular rate and rhythm. No murmurs, rubs, clicks, or gallops.  LUNGS: Clear to auscultation bilaterally without any rales, rhonchi, or wheezing.  ABDOMEN: Soft, nontender, nondistended. Positive bowel sounds x 4. No hepatosplenomegaly.  EXTREMITIES: If she has a boot in place on the left foot. The patient is moving all 4 extremities, but is sleepy.  PSYCHIATRIC: Not anxious or depressed.  LYMPH NODES: Nonpalpable.  VASCULAR: Good DP and PT pulses.  PSYCHIATRIC: Not anxious.  SKIN: She has some dry scabs on her feet.   LABORATORY DATA: Glucose 134, BUN 57, creatinine 3.09, sodium 135, potassium 4.4, chloride 102, CO2 is 22, calcium is 8.2. LFTs: Albumin at 2.4, bilirubin total 2.6, alkaline phosphatase is 185, AST 50. Troponin less than 0.02. WBC 23.6, hemoglobin 9.8, platelet count 155. INR 1.3.   ASSESSMENT AND PLAN: The patient is a 49 year old white female with a history of pulmonary embolism. Stop Coumadin. She was brought in with acute encephalopathy.  1.  Acute encephalopathy, likely due to hypoxia, possible hypercarbia. Will get an ABG STAT and monitor her mental status.  2.  History of pulmonary embolus with acute respiratory failure. At this time, the patient is started on IV heparin. She will get a lower extremity Doppler and VQ scan. The patient's creatinine is elevated, so will be unable to do a CT of the chest.  3.  Acute renal failure. We will get IV fluids due to dehydration.  4.  Hyperlipidemia. Continue atorvastatin.  5.  Schizophrenia. Hold medications due to sedation.  6.  Leukocytosis,  possible bronchitis. Treated with IV Levaquin. Obtain a urinalysis.  7.  Hypertension. Hold lisinopril.  8.  Diabetes. We will place her on sliding scale insulin.  9.  Depression. We will continue Lexapro.  10.  Miscellaneous: The patient will be on heparin for deep vein thrombosis prophylaxis.   The patient is critical care, at a high risk of cardiopulmonary  arrest.   TIME SPENT: 60 minutes     ____________________________ Serita GritShreyang H. Allena KatzPatel, MD shp:MT D: 10/04/2013 09:50:07 ET T: 10/04/2013 10:11:17 ET JOB#: [id number]  cc: Tonae Livolsi H. Allena KatzPatel, MD, <Dictator> Charise CarwinSHREYANG H Deshayla Empson MD ELECTRONICALLY SIGNED 10/15/2013 8:57

## 2014-04-28 NOTE — Consult Note (Signed)
Details:   - GI Note:  I have seen and examined Ms [name] Haas and agree with [name]'s a/p.   IDA:  No overt bleeding.  Brown stools.   last colonoscopy 12/2012 for diarrhea was normal. EGD: 10/2010 for dysphagia was normal.    Recs:  - start ferrous sulfate 325 BID - GI clinic f/u in about 1 month for iron studies, consideration of EGD and capsule - would not perform endoscopy at this time given poor medical condition, need for anti-coagulation, and lack of overt bleeding.  - ok to continue anticoagulation despite IDA.   Electronic Signatures: Dow Adolphein, Matthew (MD)  (Signed 24-Mar-15 16:44)  Authored: Details   Last Updated: 24-Mar-15 16:44 by Dow Adolphein, Matthew (MD)

## 2014-04-28 NOTE — Consult Note (Signed)
PATIENT NAME:  [name] Haas, [name] Haas MR#:  [id number] DATE OF BIRTH:  08-14-1965  DATE OF CONSULTATION:  03/25/2013  REFERRING PHYSICIAN:   CONSULTING PHYSICIAN:  Duane LopeJeffrey D. Judithann SheenSparks, MD  REFERRING PHYSICIAN: Valinda HoarHoward E. Miller, MD of orthopedics.   FAMILY PHYSICIAN: None.   REASON FOR CONSULTATION: Hypertension, elevated glucose and shortness of breath.   HISTORY OF PRESENT ILLNESS: The patient is a 49 year old female with a history of schizophrenia, degenerative disk disease, migraine headaches and chronic pain. Also has a history of hyperlipidemia and benign hypertension. Presents to the Emergency Room after falling where she was found to have an ankle fracture. While in the Emergency Room, the patient was noted to be hypoxic. Did complain of some shortness of breath earlier in the week. Also was found to be newly diagnosed diabetic with glucose over 300. She was also anemic. Her blood pressure was also borderline. The patient denies any cardiac history. Denies history of emphysema or asthma. Was a smoker, but has not smoked in several years. Denies any chest pain or palpitations.   PAST MEDICAL HISTORY: 1. Benign hypertension.  2. Obesity.  3. Hyperlipidemia.  4. Degenerative disk disease.  5. Migraine headaches.  6. Chronic pain.  7. Schizophrenia.  8. Chronic insomnia.  9. Cervical cancer status post hysterectomy.  10. Status post bilateral shoulder surgery.  11. Status post left hand surgery.   MEDICATIONS: 1. Effexor 187.5 milligrams orally twice daily  2. Phenergan 12.5 milligrams orally every 6 hours as needed.  3. Protonix 40 milligrams orally daily.  4. Lopid 0.375 milligrams orally twice daily  5. Gabapentin 600 milligrams orally twice daily  6. Lexapro 40 milligrams orally daily.  7. Diclofenac 75 milligrams orally twice daily  8. Lipitor 10 milligrams orally daily.  9. Aspirin 325 milligrams orally daily.   ALLERGIES: CELEBREX, SULFA AND PENICILLIN.   SOCIAL HISTORY: The  patient quit smoking over 5 years ago. Denies alcohol abuse.   FAMILY HISTORY: Positive for diabetes, hypertension and stroke. Negative for colon or breast cancer.   REVIEW OF SYSTEMS: CONSTITUTIONAL: No fever or change in weight.  EYES: No blurred or double vision. No glaucoma.  EARS, NOSE, THROAT: No tinnitus or hearing loss. No nasal discharge or bleeding. No difficulty swallowing.  RESPIRATORY: No cough or wheezing. Denies hemoptysis.  CARDIOVASCULAR: No chest pain or palpitations. No syncope.  GASTROINTESTINAL: No nausea, vomiting, or diarrhea. No abdominal pain. No change in bowel habits.  GENITOURINARY: No dysuria or hematuria. No incontinence.  ENDOCRINE: No polyuria or polydipsia. No heat or cold intolerance.  HEMATOLOGIC: The patient denies anemia, easy bruising or bleeding.  LYMPHATIC: No swollen glands.  MUSCULOSKELETAL: The patient denies pain in her neck, back, shoulders or hips. Does have left ankle pain.  NEUROLOGIC: No numbness. Denies stroke or seizures.  PSYCHIATRIC: The patient denies anxiety or insomnia or depression.   PHYSICAL EXAMINATION: GENERAL: The patient is obese, in no acute distress.  VITAL SIGNS: Currently remarkable for a blood pressure of 152/82, heart rate 79, respiratory rate of 24, temperature of 98.3.  HEENT: Normocephalic, atraumatic. Pupils equally round and reactive to light and accommodation. Extraocular movements are intact. Sclerae are anicteric. Conjunctivae are clear. Oropharynx: Clear.  NECK: Supple without JVD. No adenopathy or thyromegaly is noted.  LUNGS: Scattered rhonchi, without wheezes or rales. No dullness. Respiratory effort is normal.  CARDIAC: Regular rate and rhythm with normal S1, S2. No significant rubs, murmurs or gallops. PMI is nondisplaced. Chest wall is nontender.  ABDOMEN: Soft, nontender, with normoactive  bowel sounds. No organomegaly or masses were appreciated. No hernias or bruits were noted.  EXTREMITIES: Without  clubbing or cyanosis; 1+ edema present. Pulses were 2+ bilaterally.  SKIN: Warm and dry without rash or lesions.  NEUROLOGIC: Cranial nerves 2 through 12 grossly intact. Deep tendon reflexes were symmetric. Motor and sensory examination is nonfocal.  PSYCHIATRIC: Revealed a patient who was alert and oriented to person, place, and time. She was cooperative and used good judgment.   LABORATORY DATA: EKG revealed sinus rhythm with anterior ST depression with T wave inversion. Chest x-ray was unremarkable.   Her hemoglobin was 7.9 with an MCV of 72 with a white count of 5.3. Glucose was 346 with a BUN of 13, creatinine of 1.17, sodium of 131 and a potassium of 4.5. Troponin was less than 0.02. Iron level was low at 29.   ASSESSMENT: 1. New-onset diabetes.  2. Abnormal EKG.  3. Shortness of breath with hypoxia.  4. Iron deficiency anemia.  5. Benign hypertension.  6. Obesity.  7. Hyperlipidemia.   PLAN: We will hold off on surgery for now. We will obtain a CT of the chest to rule out PE. We will obtain an echocardiogram and a cardiology consult because of her abnormal EKG and shortness of breath. We will begin sliding scale insulin for her sugars with IV normal saline. We will follow her sugars routinely. We will send off anemia labs and guaiac all stools. She is being transfused 1 unit of packed red blood cells per orthopedics. We will begin an ace inhibitor empirically for her hypertension and diabetes. We will continue to follow this patient with you while in the hospital.   Thank you for the consultation. Please call if questions arise.    ____________________________ Duane Lope. Judithann Sheen, MD [id number] D: 03/25/2013 16:39:01 ET T: 03/25/2013 20:00:14 ET JOB#: [id number]  cc: Duane Lope. Judithann Sheen, MD, <Dictator> Ericson Nafziger Rodena Medin MD ELECTRONICALLY SIGNED 03/26/2013 14:26

## 2014-04-28 NOTE — Consult Note (Signed)
PATIENT NAME:  [name] Haas, [name] Haas MR#:  [id number] DATE OF BIRTH:  1965-12-05  DATE OF CONSULTATION:  10/08/2013  REFERRING PHYSICIAN:   CONSULTING PHYSICIAN:  Ruthe MannanPeter Fanny Agan, MD  REASON FOR CONSULTATION: Boot on left leg, unknown reason.   HISTORY OF PRESENT ILLNESS: The patient is a 49 year old female who was admitted to the CCU yesterday and intubated for somnolence, CO2 retention and respiratory acidosis. She presented with a boot on her left leg. Orthopedics was consulted to sort this out.   HISTORY OF PRESENT ILLNESS: The patient is intubated and I am unable to obtain any history from her, however, the [facility] father, at [phone].   The patient fell on Saturday, March 21st, going out [place] [employer] [place]. She fell in the morning. She came into [facility] at that time. She was found to have a left trimalleolar ankle fracture with subluxation of the tibialis posteriorly. This is an SE4 ankle fracture pattern. She was originally planned to have surgery, but due to her PEs she was felt not to be an operative candidate. She has been under the care of Dr. Deeann SaintHoward Miller since then. She was initially placed in a splint, then converted to a cast. She has had her cast changed every 4-5 weeks, and was due to go back to him on October 8th.   She was in a hard cast up until about 2 months ago (4 months total of hard cast treatment), but she has been transitioned to a walking boot since then. Per the father, her most recent instructions were touchdown weightbearing left lower extremity.   He reports that she has been largely in a wheelchair, but the wheelchair does not fit in the bathroom, so she uses a rolling walker in the bathroom to get to the toilet. She uses a bedside commode at night to avoid having to go into the bathroom.   Of note, the patient has had 3 shoulder operation since March 2014, these [facility]. From the father's description, it  sounds like she had a reverse total shoulder replacement, which subsequently became infected, with explant and a spacer was placed. She was given a PICC line for 10 weeks of IV antibiotics and then had a second stage reimplantation of components.   PAST MEDICAL HISTORY: Extensive. Please see the notes from Dr. Winona LegatoVaickute for full details. Her past medical history includes the following list: 1.  History of pulmonary embolism.  2.  Schizophrenia.  3.  Diabetes mellitus.  4.  Morbid obesity.   SOCIAL HISTORY: Unable to take obtain as the patient is intubated.   FAMILY HISTORY: Unable to obtain as the patient is intubated.   MEDICATIONS: Please see electronic medical record for home medications and inpatient medications.  REVIEW OF SYSTEMS: Unable to obtain due to the patient being intubated.   PHYSICAL EXAMINATION:  GENERAL: The patient is a morbidly obese female. She is supine in the intensive care unit.  VITAL SIGNS: Stable.  EXTREMITIES: Bilateral lower extremities: The bilateral lower extremities are warm and well perfused. There are no surgical scars about the bilateral lower extremities. No gross deformities of either bilateral lower extremities. She has no wounds or skin breakdown. Left upper extremity: She has a large deltopectoral scar. No erythema.   Of note, the physical examination is limited due to the patient being intubated.   IMAGING: X-rays from today are reviewed. Left ankle x-rays demonstrate an incompletely healed SER IV type pattern  trimalleolar ankle fracture. Left shoudler x-rays show a reverse TSA with a loose humeral component.  ASSESSMENT: A 49 year old female with multiple medical comorbidities, now 6 months out from nonoperative treatment of a left ankle fracture and S/P left reverse total shoudler arthroplasty.  PLAN:  1.  She may be out of her fracture boot while she was in the intensive care unit.  2.  Following her departure from the intensive care unit, we  will likely progress her weightbearing status to weightbearing as tolerated, pending review by her primary treating physician, Dr. Deeann Saint.  3.  Regarding her left shoulder, reverse total shoulder arthroplasties have lifetime nonweightbearing restriction, and she should not be using this shoulder for any weightbearing-type activities. She should follow-up at [facility] as scheduled for this.  We will reassess the patient once she is extubated and conversant.   TIME SPENT: Thirty minutes spent on this inpatient consultation. CPT 16109   ____________________________ Ruthe Mannan, MD pa:TT D: 10/08/2013 11:14:33 ET T: 10/08/2013 15:20:18 ET JOB#: [id number]  cc: Ruthe Mannan, MD, <Dictator> Althea Grimmer. Mickle Plumb, MD PhD Orthopaedic Surgery ELECTRONICALLY SIGNED 10/08/2013 20:10

## 2014-04-28 NOTE — Consult Note (Signed)
Admit Diagnosis:   ALTERED MENTAL STATUS: Onset Date: 13-Oct-2013, Status: Active, Description: ALTERED MENTAL STATUS    Intubated: intubated in the first attempt with 8.00 mm ET tube using mac 4 blade. Tube secured at 22 cm at lip. Bilateral breathsounds noted. end tidal Co2 monitor turned yelow. Chest X-ray done   Migraines:    3 bulging discs in back:    Chronic R hand pain:    Insomnia:    Torn tendon in L shoulder:    HTN:    Schizophrenia:    L Hand surgery:    R shoulder surgery:    Removal of Cervical CA:    Hysterectomy:    Retinal detachment:    Laparoscopy:   Home Medications: Medication Instructions Status  Norco 325 mg-10 mg oral tablet 1 tab(s) orally every 6 hours Active  QUEtiapine 300 mg oral tablet 1 tab(s) orally once a day (at bedtime) Active  pantoprazole 40 mg oral delayed release tablet 1 tab(s) orally once a day Active  escitalopram 20 mg oral tablet 2 tabs (40mg ) orally once a day Active  Abilify 30 mg oral tablet 1 tab(s) orally once a day Active  Myrbetriq 50 mg oral tablet, extended release 1 tab(s) orally once a day at 4pm Active  diclofenac sodium 75 mg oral delayed release tablet 1 tab(s) orally 2 times a day Active  dicyclomine 20 mg oral tablet 1 tab(s) orally 3 times a day Active  Brisdelle 7.5 mg oral capsule 1 cap(s) orally once a day (at bedtime) Active  gabapentin 600 mg oral tablet 2 tabs (1200mg ) orally once a day (at bedtime) Active  flurazepam 15 mg oral capsule 3 caps (45mg ) orally once a day (at bedtime) Active   Lab Results: Routine Hem:  09-Oct-15 05:51   WBC (CBC) 6.8  RBC (CBC)  3.07  Hemoglobin (CBC)  7.8  Hematocrit (CBC)  25.0  Platelet Count (CBC) 254  MCV 81  MCH  25.4  MCHC  31.2  RDW  16.0  Neutrophil % 77.9  Lymphocyte % 15.6  Monocyte % 5.5  Eosinophil % 0.7  Basophil % 0.3  Neutrophil # 5.3  Lymphocyte # 1.1  Monocyte # 0.4  Eosinophil # 0.1  Basophil # 0.0 (Result(s) reported on 13 Oct 2013 [facility]:23AM.)   Radiology Results:  Radiology Results: US:    07-Oct-15 18:46, US Kidney Bilateral  US Kidney Bilateral  REASON FOR EXAM:    bacteremia  COMMENTS:       PROCEDURE: US  - US KIDNEY  - Oct 11 2013  6:46PM     CLINICAL DATA:  Bacteremia.  Right flank tenderness.    EXAM:  RENAL/URINARY TRACT ULTRASOUND COMPLETE    COMPARISON:  None.    FINDINGS:  Right Kidney:  Length: 14.0 cm. Cortical echogenicity is unremarkable. There is  moderate right hydronephrosis with debris within the collecting  system. Cluster of hyperechoic foci are seen in the upper pole of  the right kidney.    Left Kidney:    Length: 13.5 cm. Echogenicity within normal limits. No mass or  hydronephrosis visualized.    Bladder:    Appears normal for degree of bladder distention.     IMPRESSION:  Moderate right hydronephrosis with debris within the urinary  collecting system. The cause for obstruction is not identified.    Findings compatible calcifications in the upper pole the right  kidney which may be small stones or parenchymal calcification  secondary to some prior  insult.      Electronically Signed    By: Drusilla Kanner M.D.    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 Clent Ridges at 2023.      Electronically Signed    By: Simonne Come M.D.    On: 10/11/2013 20:24    CLINICAL DATA:  Right upper quadrant abdominal pain. Bacteremia.  Sludge within the gallbladder on ultrasound. Concern for acute  cholecystitis.  Subsequent encounter.    EXAM:  CT ABDOMEN AND PELVIS WITHOUT CONTRAST    TECHNIQUE:  Multidetector CT imaging of the abdomen and pelvis was performed  following the standard protocol without IVcontrast.    COMPARISON:  Right upper quadrant abdominal ultrasound - 10/07/2013;  renal ultrasound - 10/11/2013; nuclear medicine HIDA scan -  10/10/2013; chest radiograph -10/09/2013    FINDINGS:  The lack of intravenous contrast limits the abilityto evaluate  solid abdominal organs.    There are scattered ill-defined foci of subcutaneous emphysema about  the superior pole of the right kidney (representative images 33 and  35, series 2; coronal image 100, series 5). Scattered foci of  subcutaneous emphysema extent cranially to abut the medial aspect of  the right posterior hemidiaphragm (image 28, series 5). These  findings are associated with apparent mild right-sided  pelvicaliectasis and asymmetric right-sided perinephric stranding,  the etiology of which is not depicted on this examination,  specifically, no obstructing right-sided renal stones. There is  rather extensive asymmetric right-sided perinephric stranding. No  definitive organized/drainable fluid collections on this noncontrast  examination.    Note is made of a punctate (approximately 8 mm) nonobstructing  left-sided renal stone (coronal image 100, series 5). There is a  small amount of air within the urinary bladder. Otherwise, normal  noncontrast appearance of theurinary bladder given degree  distention.    Normal hepatic contour. The gallbladder is underdistended with  associated apparent mild diffuse gallbladder wall thickening,  possibly the sequela of underdistention. No radiopaque gallstones.  There is atrace amount of fluid adjacent to the caudal aspect of  the right lobe of the liver.  Normal noncontrast appearance of the bilateral adrenal glands. The  spleen is enlarged measuring 17 cm in length (image 30,  series 2)  incidental note is made of a small splenule.    Moderate colonic stool burden without evidence of obstruction. The  bowel is otherwise normal in course and caliber without discrete  area of wall thickening. Normal noncontrast appearance of the  retrocecal appendix. No pneumoperitoneum, pneumatosis or portal  venous gas.    Scattered calcified atherosclerotic plaque within a normal caliber  abdominal aorta. Scattered shotty porta hepatis and retroperitoneal  lymph nodes are individually not enlarged by size criteria with  index porta hepatis lymph node measuring 0.8 cm in greatest short  axis diameter (image 27, series 6) and index aortocaval lymph node  measuring 0.7 cm (image 40, series 2), presumably reactive in  etiology.    Post hysterectomy. No discrete adnexal lesions. There is a minimal  amount of fluid within the pelvic cul-de-sac.    Limited visualization of the lower thorax demonstrates trace  bilateral effusions with associated bibasilar dependent  consolidative opacities. There is minimal subsegmental atelectasis  within the imaged peripheral aspect of the right middle lobe.    Normal heart size. Trace amount of pericardial fluid, presumably  physiologic.    No acute or aggressive osseous abnormalities.  There is mild diffuse body wall anasarca about the midline of the  lower back, the bilateral lower abdominal flanks and imaged  bilateral thighs.     IMPRESSION:  1. Findings worrisome for acute right-sided pyelonephritis caused by  a gas producing organism with scattered foci of subcutaneous  emphysema within the superior pole of the right kidney extending to  abut the medial aspect of the right hemidiaphragm. These findings  are associated with very mild dilatation of the right renal pelvis,  the etiology of which is presumably secondary to ongoing infection  as an obstructing renal stone is not identified. Further evaluation  for renal abscess could  be performed with contrast-enhanced  abdominal CT as clinically indicated.  2. Punctate (approximately 8 mm) nonobstructing left-sided renal  stone.  3. Small amount of air within the urinary bladder, presumably the  sequela of in-and-out Foley catheterization. Otherwise, normal  noncontrast appearance of the urinary bladder.  4. Trace bilateral effusions with associated body wall edema,  nonspecific though could be indicative of pulmonary edema. Clinical  correlation is advised.  5. Nonspecific splenomegaly.  6. Mild gallbladder wall thickening, possibly secondary to  underdistention. No radiopaque gallstones.  Critical Value/emergent results were called by telephone at the time  of interpretation on 10/11/2013 at 8:08 pm to [facility], [place], who  verbally acknowledged these results.    Electronically Signed:  By: Simonne Come M.D.  On: 10/11/2013 20:15         Verified By: Alfredia Ferguson, M.D.,    Rubbing Alcohol Wipes: Anaphylaxis  Vioxx: Hives  Sulfa drugs: Rash  Penicillin: Rash  bextra: Other  Celebrex: Unknown  Rofecoxib: Unknown  Nursing Flowsheets: **Vital Signs.:   09-Oct-15 20:18  Vital Signs Type Routine  Temperature Temperature (F) 97.9  Celsius 36.6  Temperature Source oral  Pulse Pulse 77  Systolic BP Systolic BP 105  Diastolic BP (mmHg) Diastolic BP (mmHg) 67  Mean BP 79  Pulse Ox % Pulse Ox % 96  Pulse Ox Activity Level  At rest  Oxygen Delivery 2L  *Intake and Output.:   Daily 09-Oct-15 07:00  Grand Totals Intake:  0 Output:  50    Net:  -50 24 Hr.:  -50  Oral Intake      In:  0  Urine ml     Out:  50  Length of Stay Totals Intake:  12809.1 Output:  9425    Net:  3384.1    General Aspect Right Emphysematous Pyelonephritis   Present Illness 49 year old morbidly obese, schizophrenic woman admitted 10/04/13 with altered mental status, hypoxia, bacteremia (enterobacter aerogenes), acute renal failure (Cr 3.09), required ICU resuscitation, now  extubated and mentating at baseline. She had recent PE after joint surgery and remains fully anticoagulated on Xeralto. Urology consulted today for CT finding 10/7 of right emphysematous pyelonephritis. WBC as high as 12.0 on 10/7, no documented fevers, she has been on Meropenem.  Today, she feels well, no right flank pain, no voiding symptoms, no fevers/chills,  no nausea/vomiting or any other complaints. She is still O2 dependent ([place]).  PAST MEDICAL HISTORY:  1.  History of bilateral pulmonary embolism associated with the ankle fracture.  2.  Iron deficiency anemia.  3.  Left ankle fracture; she still has a boot on and is not much ambulatory.  4.  Hypertension.  5.  History of migraine headaches.  6.  Schizophrenia.  7.  Diabetes with hemoglobin A1c of 8.3 noted during previous hospitalization.  8.  Morbid obesity.  9.  Hyperlipidemia.  10.  Degenerative disk disease.  11.  Chronic pain.   ALLERGIES: TO MULTIPLE MEDICATIONS, INCLUDING VIOXX, BEXTRA, CELEBREX, PENICILLIN, ROFECOXIB, RUBBING ALCOHOL WIPES, SULFA DRUGS.   SOCIAL HISTORY: Used to smoke; no longer is smoking and quit smoking about a year ago.   FAMILY HISTORY: Positive for diabetes, hypertension, and stroke. Negative for colon cancer.  12.  Chronic insomnia.  13.  History of cervical cancer, status post hysterectomy.  14.  History of bilateral shoulder surgeries and left hand surgeries.   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 Right renal collecting system without  significant hydronephrosis and no obvious obstruction. Likely related to recent bacteremic episode with enterobacter, which may have seeded the upper pole of the renal parenchyma.   Plan Emphysematous pyelonephritis can be a severe, life threatening condition that may require nephrectomy for curative treatment. That said, she has improved significantly with targeted antibiotic therapy and remains asymptomatic and afebrile without leukocytosis.  She has no indication for drainage of her collecting system (via stent or nephrostomy tube) as there is no hydronephrosis or obstructing stone.  In her case, I think the best course of action would be to continue IV antibiotic therapy, likely for a 6 week course. Please consult infectious disease for guidance.  I will continue to follow her case.   Electronic Signatures: Graceann Congress (MD)  (Signed 09-Oct-15 21:09)  Authored: Health Issues, Significant Events - History, Home Medications, Labs, Radiology Results, Allergies, Vital Signs, General Aspect/Present Illness, History and Physical Exam, Impression/Plan   Last Updated: 09-Oct-15 21:09 by Graceann Congress (MD)

## 2014-04-28 NOTE — Consult Note (Signed)
PATIENT NAME:  [name] Haas, [name] MR#:  [id number] DATE OF BIRTH:  02-11-1965  DATE OF CONSULTATION:  03/26/2013  CONSULTING PHYSICIAN:  [name] NancyShaukat A. Noelle Sease, MD  INDICATION FOR CONSULTATION: Abnormal EKG status post fracture of the left ankle.   HISTORY OF PRESENT ILLNESS: This is a 49 year old white female with a past medical history of schizophrenia and degenerative disk disease, migraine headaches, chronic pain syndrome who came into the Emergency Room after she tripped and fell and had fracture of the ankle. She denies any chest pain, shortness of breath, PND, orthopnea, or leg swelling.   PAST MEDICAL HISTORY: History of obesity. History of hypertension. History of hyperlipidemia, history of diabetes, chronic insomnia. No history of pulmonary embolism.   MEDICATIONS: Lopid, Protonix, gabapentin, Lipitor and Phenergan.   ALLERGIES: CELEBREX, SULFA AND PENICILLIN.   SOCIAL HISTORY: She quit smoking five years ago. Denies EtOH abuse.   FAMILY HISTORY: Positive for diabetes, hypertension, and stroke.   PHYSICAL EXAMINATION: GENERAL: She is alert, oriented x 3, in no acute distress.  VITAL SIGNS: Vitals are stable.  NECK: No JVD.  LUNGS: Clear.  HEART: Regular rate and rhythm. Normal S1, S2. No audible murmur.  ABDOMEN: Soft, nontender, positive bowel sounds.  EXTREMITIES: No pedal edema.  NEUROLOGIC: She appears to be intact.   EKG showed normal sinus rhythm, 78 beats per minute, some nonspecific ST and T wave changes in lead V1 to V2. There is questionable Q wave in lead 3 and and AVF.   ASSESSMENT AND PLAN: Abnormal EKG. Troponins are negative. She denies any chest pain and shortness of breath. The patient has ankle fracture, advised continuation of heparin for prophylaxis of deep vein thrombosis and PE. EKG has no acute changes. Troponins are negative. We will look at echocardiogram for ejection fraction; however, the patient is low risk for any complication, especially cardiac  complication. Advise proceeding with surgery. Thank you very much for the referral.   ____________________________ [name] NancyShaukat A. Coralee Edberg, MD sak:sg D: 03/26/2013 10:46:21 ET T: 03/26/2013 11:05:08 ET JOB#: [id number]  cc: [name] NancyShaukat A. Raphel Stickles, MD, <Dictator> [name] NancySHAUKAT A Knut Rondinelli MD ELECTRONICALLY SIGNED 04/19/2013 13:58

## 2014-04-28 NOTE — Consult Note (Signed)
Chief Complaint:  Subjective/Chief Complaint No overnight events. Tol POs. No fevers/chills. No pain.   VITAL SIGNS/ANCILLARY NOTES: **Vital Signs.:   11-Oct-15 04:39  Vital Signs Type Routine  Temperature Temperature (F) 99.1  Celsius 37.2  Pulse Pulse 83  Respirations Respirations 20  Systolic BP Systolic BP 500  Diastolic BP (mmHg) Diastolic BP (mmHg) 66  Mean BP 79  Pulse Ox % Pulse Ox % 98  Pulse Ox Activity Level  At rest  Oxygen Delivery 2L  *Intake and Output.:   Shift 11-Oct-15 15:00  Grand Totals Intake:  120 Output:      Net:  120 24 Hr.:  120  Oral Intake      In:  120  Length of Stay Totals Intake:  14290.1 Output:  93818    Net:  3115.1   Brief Assessment:  GEN well developed, well nourished, no acute distress   Cardiac Regular  no murmur  -- thrills  -- LE edema  --Rub   Respiratory normal resp effort  clear BS   Gastrointestinal Normal   Gastrointestinal details normal Soft  Nontender  Nondistended   EXTR negative cyanosis/clubbing   Additional Physical Exam No CVAT   Lab Results: Routine Chem:  11-Oct-15 04:10   Result Comment - This specimen was collected through an   - indwelling catheter or arterial line.  - A minimum of 33ms of blood was wasted prior    - to collecting the sample.  Interpret  - results with caution.  Result(s) reported on 15 Oct 2013 at 07:44AM.  Result Comment LABS - This specimen was collected through an   - indwelling catheter or arterial line.  - A minimum of 563m of blood was wasted prior    - to collecting the sample.  Interpret  - results with caution.  Result(s) reported on 15 Oct 2013 at 04:40AM.  Magnesium, Serum  1.5 (1.8-2.4 THERAPEUTIC RANGE: 4-7 mg/dL TOXIC: > 10 mg/dL  -----------------------)  Glucose, Serum 89  BUN 9  Creatinine (comp) 1.00  Sodium, Serum 142  Potassium, Serum  3.3  Chloride, Serum 105  CO2, Serum 30  Calcium (Total), Serum  8.0  Anion Gap 7  Osmolality (calc) 281  eGFR  (African American) >60  eGFR (Non-African American) >60 (eGFR values <6057min/1.73 m2 may be an indication of chronic kidney disease (CKD). Calculated eGFR, using the MRDR Study equation, is useful in  patients with stable renal function. The eGFR calculation will not be reliable in acutely ill patients when serum creatinine is changing rapidly. It is not useful in patients on dialysis. 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   Plan 1) Continue IV antibiotics for total of 6 weeks 2) Infectious Disease Consultation 3) Urology  will sign off -- please arrange Urology follow-up in 4-6 weeks as outpatient with repeat CT Abd/Pelvis with and without IV contrast. Please reimage and replace consult if patient becomes acutely ill.  Thank you for involving me in the care of [name] Haas.   Electronic Signatures: Prentiss Bells (MD)  (Signed 11-Oct-15 09:32)  Authored: Chief Complaint, VITAL SIGNS/ANCILLARY NOTES, Brief Assessment, Lab Results, Assessment/Plan   Last Updated: 11-Oct-15 09:32 by Prentiss Bells (MD)

## 2014-04-28 NOTE — Consult Note (Signed)
Brief Consult Note: Diagnosis: Old left bimalleolar ankle fracture now healed.   Patient was seen by consultant.   Consult note dictated.   Comments: Patient has a well healed left bimalleolar fracture treated without surgery.  Slight talar subluxation.  Mild degenerative changes in left ankle.  May WBAT in boot with PT.  Will likely need assistance of walker.  She may remove boot if sitting or in bed or performing non weight bearing exercises with PT.  If she is having any issues she may follow up with Dr. Deeann SaintHoward Miller in clinic.  Electronic Signatures: Juanell FairlyKrasinski, Jadrian Bulman (MD)  (Signed 10-Oct-15 11:27)  Authored: Brief Consult Note   Last Updated: 10-Oct-15 11:27 by Juanell FairlyKrasinski, Aniayah Alaniz (MD)

## 2014-04-28 NOTE — Discharge Summary (Signed)
PATIENT NAME:  [name], Haas MR#:  [id number] DATE OF BIRTH:  [birth year]  DATE OF ADMISSION:  12/05/2013 DATE OF DISCHARGE:  12/06/2013  ADMITTING DIAGNOSIS: Ventricular tachycardia.  DISCHARGE DIAGNOSES:  1.  Hypotension.  2.  Dehydration.  3.  Acute renal failure. 4.  Diabetes mellitus, diet-controlled, hemoglobin A1c 4.9.  5.  Anemia.  6.  Electrical telemetry artifacts suspicious for ventricular tachycardia, non-ventricular tachycardia  according to cardiology.  7.  History of hypertension. 8.  Morbid obesity.  9.  Pulmonary embolism. 10.  Obstructive sleep apnea. 11.  Left ankle fracture.  12.  Migraine headaches. 13.  Schizophrenia. 14.  Degenerative disc disease.  15.  Hyperlipidemia.   DISCHARGE CONDITION: Stable.   DISCHARGE MEDICATIONS: The patient is to resume her outpatient medications which are lorazepam 15 mg p.o. 3 capsules at bedtime, gabapentin 600 mg 2 capsules once daily at bedtime, Myrbetriq 50 mg once daily, Seroquel 300 mg p.o. at bedtime, dicyclomine 20 mg p.o. 3 times daily, atorvastatin 10 mg p.o. daily, pantoprazole 40 mg p.o. twice daily, biotin 5 mg p.o. daily, diclofenac sodium 75 mg p.o. twice daily, oxycodone 10 mg every 4 hours as needed, Abilify 30 mg p.o. daily,   HOME OXYGEN: None.   DIET: Two gram salt, low-fat, low-cholesterol, carbohydrate-controlled diet.   ACTIVITY LIMITATIONS: As tolerated.    FOLLOWUP APPOINTMENTS: [facility], primary care physician 2 days after discharge.   CONSULTANTS: Care management, social worker, Christena Deem, MD, Lamar Blinks, MD.  RADIOLOGIC STUDIES: None.   Echocardiogram 12/05/2013, revealing left ventricular ejection fraction by visual estimation 55% to 60%, normal global left ventricular systolic function, mild mitral valve regurgitation, moderately increased left ventricular posterior wall thickness.  HOSPITAL COURSE: The patient is a 49 year old Caucasian female with history of left  ankle fracture as well as peptic ulcer disease, who presented to the endoscopy for followup of her ulcerations. She underwent procedure and during the procedure she had 8-beat run of what looked like ventricular tachycardia, so hospitalist services were contacted for admission and observation. She was also noted to be hypotensive with systolic blood pressure of 95/57. On arrival to the hospital, she was afebrile with temperature of 98, pulse was 80, respiration rate was 16 to 20, blood pressure 89/59, later on 95/57 after IV fluid administration. The patient's physical examination was unremarkable. The patient's laboratory data done on arrival to the hospital 12/05/2013 and showed elevated creatinine to 1.45, glucose 115, otherwise BMP was unremarkable. The patient's liver enzymes were not checked. CBC: White blood cell count was 6.5, hemoglobin was 10.3, platelet count was 228,000, absolute neutrophil count was 4.7. Coagulation panel was unremarkable. EKG showed normal sinus rhythm at 78 beats per minute, prolonged QTc to 483 milliseconds, and no acute ST-T changes were noted. The patient was admitted to the hospital for further evaluation and consultation with Dr. Gwen Pounds was obtained. Dr. Gwen Pounds saw the patient in consultation on 12/05/2013. He felt that the patient had what looked like SVT with wide complex, no evidence of myocardial infarction or congestive heart failure. He recommended to continue hydration, following improvement of her kidney disease and get an echocardiogram, which was performed, and continue telemetry to follow for further episodes of tachycardia requiring intervention. The patient was rehydrated and her kidney function normalized. Her creatinine on 12/06/2013 was 1.16 with telemetry followup. However, she was not noticed to have any recurrent episodes of telemetry abnormalities. Cardiologist, who saw her in consultation followup 12/06/2013, felt that the patient's telemetry  episode  which looked like ventricular tachycardia was very likely electrical artifact with telemetry, which was not recurring. He recommended no further diagnostics at this time since her echocardiogram looked okay. He recommended to ambulate the patient and possibly discharge home. The patient was ambulated, she felt comfortable and her vitals were stable. On the day of discharge, temperature was 97.5, pulse was 69, respirations were 18 to 20, blood pressure was 146/90, saturation was 92% to 94% on room air at rest.   In regard to acute renal failure, it was unclear why the patient had an episode of acute renal failure, however, dehydration was implicated. Because of concerns that diabetes could have caused dehydration, hemoglobin A1c was performed; however, that was found to be 4.9. In regard to her chronic medical problems such as hypertension, morbid obesity, obstructive sleep apnea and so forth, the patient is to continue her outpatient management. No changes were made.   The patient is being discharged in stable condition with the above-mentioned medications and followup. Again, on the day of discharge, temperature was 97.5, pulse was 69, respiration was 18, blood pressure 146/90, saturation 92% to 94% on room air at rest.   TIME SPENT: Forty minutes.    ____________________________ Katharina Caperima Gursimran Litaker, MD rv:TT D: 12/06/2013 17:05:49 ET T: 12/06/2013 18:11:06 ET JOB#: [id number]  cc: Katharina Caperima Faline Langer, MD, <Dictator> [facility] Herbert Marken MD ELECTRONICALLY SIGNED 12/12/2013 12:03

## 2014-04-28 NOTE — Consult Note (Signed)
Chief Complaint:  Subjective/Chief Complaint Left ankle fracture.  some pain but stable.  Medical evaluation continues.  Discussed with Dr Farris Has who agrees that surgery should not be done unless absoulutely necessary.  With satisfactory closed reduction I believe we should attempt cast treatment if possible.  She is at very high risk for multiple compliations with surgery due to her pulmonary embolism, Diabetes, obesity, etc.  Risk of infection increased greatly.  Have discussed ths with patient who agrees with deferring surgery unless and until absolutely necessary. Will cast tomorrow.   VITAL SIGNS/ANCILLARY NOTES: **Vital Signs.:   22-Mar-15 11:28  Vital Signs Type Q 4hr  Temperature Temperature (F) 97.8  Celsius 36.5  Pulse Pulse 76  Respirations Respirations 20  Systolic BP Systolic BP 035  Diastolic BP (mmHg) Diastolic BP (mmHg) 72  Mean BP 85  Pulse Ox % Pulse Ox % 96  Oxygen Delivery 4L   Brief Assessment:  GEN obese   Cardiac Regular   Respiratory normal resp effort   EXTR negative cyanosis/clubbing, negative edema   Additional Physical Exam Alert and fairly comfortable.  Minimal swelling in left foot.  circulation/sensation/motor function good. splint intact.   Lab Results: LabObservation:  22-Mar-15 09:34   OBSERVATION Reason for Test    10:34   OBSERVATION Reason for Test PE, r/o DVT  Lab:  22-Mar-15 04:00   O2 Saturation (Pulse Ox) 89  FiO2 (Pulse Ox) ra (Result(s) reported on 26 Mar 2013 at 05:31AM.)  Cardiology:  22-Mar-15 09:34   Echo Doppler REASON FOR EXAM:     COMMENTS:     PROCEDURE: [facility] - ECHO DOPPLER COMPLETE(TRANSTHOR)  - Mar 26 2013  9:34AM   RESULT: Echocardiogram Report  Patient Name:   DYANNE YORKS Date of Exam: 03/26/2013 Medical Rec #:  [id number]        Custom1: Date ofBirth:  15-May-1965     Height:       65.0 in Patient Age:    49 years      Weight:       224.0 lb Patient Gender: F             BSA:          2.08 m??  Indications:  SOB Sonographer:    LTM Referring Phys: MODY, SITAL, P  Summary:  1. Left ventricular ejection fraction, by visual estimation, is 55 to  60%. Normal wallmotion.  2. Elevated mean left atrial pressure.  3. Impaired relaxation pattern of LV diastolic filling.  4. Normal right ventricular size and systolic function.  5. The left atrium is normal in size and structure.  6. The right atrium is normal in size and structure.  7. Mild aortic regurgitation.  8. Mildly increased left ventricular posterior wall thickness. 2D AND M-MODE MEASUREMENTS (normal ranges within parentheses): Left Ventricle:          Normal   AoV Cusp Separation: 2.20 cm (1.5-2.6) IVSd (2D):      1.10 cm (0.7-1.1) LVPWd (2D):     1.27 cm (0.7-1.1) Aorta/LA:                  Normal LVIDd (2D):     4.78 cm (3.4-5.7) Aortic Root (2D): 2.80 cm (2.4-3.7) LVIDs (2D):     3.37 cm           AoV Cusp Exc:     2.20 cm (1.5-2.6) LV FS (2D):     29.5 %   (>25%)   Left Atrium (2D): 3.80  cm (1.9-4.0) LV EF (2D):     56.4 %   (>50%)                                    Right Ventricle:                    RVd (2D):        7.10 cm LV DIASTOLIC FUNCTION: MV Peak E: 0.83 m/s Decel Time: 290 msec MV Peak A: 0.85 m/s E/A Ratio: 0.98 SPECTRAL DOPPLER ANALYSIS (where applicable): Mitral Valve: MV P1/2 Time: 84.10 msec MV Area, PHT:2.62 cm?? Aortic Valve: AoV Max Vel: 1.48 m/s AoV Peak PG: 8.8 mmHg AoV Mean PG: LVOT Vmax: 1.19 m/s LVOT VTI:  LVOT Diameter: 1.95 cm AoV Area, Vmax: 2.40 cm?? AoV Area, VTI:  AoV Area, Vmn: Pulmonic Valve: PV Max Velocity: 1.23 m/s PV Max PG: 6.1 mmHg PV Mean PG:  PHYSICIAN INTERPRETATION: Left Ventricle: The left ventricular internal cavity size was normal. LV  posterior wall thickness was mildly increased. Left ventricular ejection  fraction, by visual estimation, is 55 to 60%. Spectral Doppler shows  impaired relaxation pattern of LV diastolic filling. Elevated mean left  atrial pressure. Right  Ventricle: Normal right ventricular size, wall thickness, and  systolic function. Left Atrium: The left atrium is normal in size andstructure. Right Atrium: The right atrium is normal in size and structure. Mitral Valve: Trace mitral valve regurgitation is seen. Tricuspid Valve: Trivial tricuspid regurgitation is visualized. Aortic Valve: Mild aortic valve regurgitation is seen.  [place] MD Electronically signed by [id number] Neoma Laming MD Signature Date/Time: 03/26/2013/10:55:49 AM  *** Final ***  IMPRESSION: .   Verified By: Emmit Pomfret. Humphrey Rolls, M.D., MD  Routine Chem:  22-Mar-15 03:18   Glucose, Serum  137  BUN 9  Creatinine (comp) 0.90  Sodium, Serum 137  Potassium, Serum 4.4  Chloride, Serum 103  CO2, Serum 27  Calcium (Total), Serum  8.4  Anion Gap 7  Osmolality (calc) 275  eGFR (African American) >60  eGFR (Non-African American) >60 (eGFR values <29m/min/1.73 m2 may be an indication of chronic kidney disease (CKD). Calculated eGFR is useful in patients with stable renal function. The eGFR calculation will not be reliable in acutely ill patients when serum creatinine is changing rapidly. It is not useful in  patients on dialysis. The eGFR calculation may not be applicable to patients at the low and high extremes of body sizes, pregnant women, and vegetarians.)  Routine Coag:  22-Mar-15 09:02   Activated PTT (APTT) 33.5 (A HCT value >55% may artifactually increase the APTT. In one study, the increase was an average of 19%. Reference: "Effect on Routine and Special Coagulation Testing Values of Citrate Anticoagulant Adjustment in Patients with High HCT Values." American Journal of Clinical Pathology 2006;126:400-405.)  Routine Hem:  22-Mar-15 03:18   WBC (CBC) 5.8  RBC (CBC)  3.75  Hemoglobin (CBC)  8.6  Hematocrit (CBC)  27.7  Platelet Count (CBC)  114  MCV  74  MCH  22.9  MCHC  31.0  RDW  21.4  Neutrophil % 58.2  Lymphocyte % 31.0  Monocyte % 7.0   Eosinophil % 3.1  Basophil % 0.7  Neutrophil # 3.4  Lymphocyte # 1.8  Monocyte # 0.4  Eosinophil # 0.2  Basophil # 0.0 (Result(s) reported on 26 Mar 2013 at 04:10AM.)    09:02   WBC (CBC)  6.4  RBC (CBC) 4.04  Hemoglobin (CBC)  9.3  Hematocrit (CBC)  29.8  Platelet Count (CBC) 170  MCV  74  MCH  23.0  MCHC  31.2  RDW  21.4  Neutrophil % 63.0  Lymphocyte % 24.1  Monocyte % 8.3  Eosinophil % 4.1  Basophil % 0.5  Neutrophil # 4.0  Lymphocyte # 1.5  Monocyte # 0.5  Eosinophil # 0.3  Basophil # 0.0 (Result(s) reported on 26 Mar 2013 at 09:47AM.)   Assessment/Plan:  Assessment/Plan:  Assessment trimalleolar fracture dislocation left ankle multiple medical problems   Plan Will cast tomorrow.   Risk of complcations with surgery outweigh benefits at this time.   Electronic Signatures: Park Breed (MD)  (Signed 22-Mar-15 14:34)  Authored: Chief Complaint, VITAL SIGNS/ANCILLARY NOTES, Brief Assessment, Lab Results, Assessment/Plan   Last Updated: 22-Mar-15 14:34 by Park Breed (MD)

## 2014-04-28 NOTE — Consult Note (Signed)
Chief Complaint:  Subjective/Chief Complaint Patietn seen earlier this am in the endoscopy unit for EGD to follow up a previous gastric ulcer.  When patient was initially seen in the endo unit, she was pale, stated she was not feeling good (tired/weak) and reported having several bm that weere bloody over the weekend which she did not report otherwise.  No shortness of breath or chest pain reported.   Rectal exam showed stool non-bloody, and no evidence of other lesion.  There was a possible fistula opening versus local irritation on the left glut about 5cm from the anal orifice without discharge.  Labs were obtained showing normal coag and plt, hgb 10.1.  Last  hgb from about 11/14 as o/p was 11.1.   She had otherwise normal labs.  Proceedure done with finding of healing/healed GU noted previously, mild diffuse gastritis.   There was a rhythm strip noted after the case of a short run of VT, with hypotension necessitating some neosynephrine.  Consult obtained from Int Med for admission and further evaluation.   Discussed with Dr Vianne Bulls.   Brief Assessment:  GEN obese   Cardiac Regular   Respiratory clear BS   Gastrointestinal details normal Soft  No masses palpable  Bowel sounds normal  No rebound tenderness  mild diffuse discomfort (noted before proceedure, see pre-proceedure evaluation.)   Lab Results: Routine Chem:  01-Dec-15 08:18   Glucose, Serum  115  BUN 15  Creatinine (comp)  1.45  Sodium, Serum 139  Potassium, Serum 4.5  Chloride, Serum 104  CO2, Serum 27  Calcium (Total), Serum 8.7  Anion Gap 8  Osmolality (calc) 279  eGFR (African American)  49  eGFR (Non-African American)  41 (eGFR values <57m/min/1.73 m2 may be an indication of chronic kidney disease (CKD). Calculated eGFR, using the MRDR Study equation, is useful in  patients with stable renal function. The eGFR calculation will not be reliable in acutely ill patients when serum creatinine is changing rapidly. It is  not useful in patients on dialysis. The eGFR calculation may not be applicable to patients at the low and high extremes of body sizes, pregnant women, and vegetarians.)  Routine Coag:  01-Dec-15 08:18   Prothrombin 13.5  INR 1.0 (INR reference interval applies to patients on anticoagulant therapy. A single INR therapeutic range for coumarins is not optimal for all indications; however, the suggested range for most indications is 2.0 - 3.0. Exceptions to the INR Reference Range may include: Prosthetic heart valves, acute myocardial infarction, prevention of myocardial infarction, and combinations of aspirin and anticoagulant. The need for a higher or lower target INR must be assessed individually. Reference: The Pharmacology and Management of the Vitamin K  antagonists: the seventh ACCP Conference on Antithrombotic and Thrombolytic Therapy. CEMVVK.1224Sept:126 (3suppl): [id number] A HCT value >55% may artifactually increase the PT.  In one study,  the increase was an average of 25%. Reference:  "Effect on Routine and Special Coagulation Testing Values of Citrate Anticoagulant Adjustment in Patients with High HCT Values." American Journal of Clinical Pathology 2006;126:400-405.)  Routine Hem:  01-Dec-15 08:18   WBC (CBC) 6.5  RBC (CBC)  3.65  Hemoglobin (CBC)  10.3  Hematocrit (CBC)  32.3  Platelet Count (CBC) 228  MCV 89  MCH 28.3  MCHC  31.9  RDW  21.3  Neutrophil % 72.1  Lymphocyte % 17.2  Monocyte % 6.3  Eosinophil % 3.9  Basophil % 0.5  Neutrophil # 4.7  Lymphocyte # 1.1  Monocyte #  0.4  Eosinophil # 0.3  Basophil # 0.0 (Result(s) reported on 05 Dec 2013 at 08:45AM.)   Electronic Signatures: Loistine Simas (MD)  (Signed 01-Dec-15 12:38)  Authored: Chief Complaint, Brief Assessment, Lab Results   Last Updated: 01-Dec-15 12:38 by Loistine Simas (MD)

## 2014-04-28 NOTE — Consult Note (Signed)
PATIENT NAME:  [name] Haas, [name] Haas MR#:  [id number] DATE OF BIRTH:  04/15/1965  DATE OF CONSULTATION:  03/28/2013  REFERRING PHYSICIAN:  Rima Vaickute, MD CONSULTING PHYSICIAN:   M. , PA-C ATTENDING GASTROENTEROLOGIST: Matthew Rein, MD  REASON FOR CONSULTATION: Iron deficiency anemia with the patient requiring anticoagulation for recent pulmonary embolism.   HISTORY OF PRESENT ILLNESS: This is a pleasant 49-year-old female who recently sustained a fall resulting in a left ankle fracture. She did not require surgery but does have her leg casted and elevated in the hospital bed at the present moment.   In the Emergency Room, she was found to be hypoxic and as a result, was admitted. A follow-up CT angiogram of the chest was positive for bilateral pulmonary embolism. Because of this, she was started on heparin-Coumadin bridge and is currently being anticoagulated. There are some current concerns because she is anemic and it does appear to be microcytic. Her hemoglobin today was 8.5 with an MCV of 74. At admission, her hemoglobin was 7.9 and she was transfused 1 unit of blood that brought it back up to 9.3.   Over the past 3 days, her hemoglobin has maintained stability. Her INR is 1.7 and PT was 20.0 today. She did have a bowel movement this morning that was well formed and she did not notice any signs of hematochezia, melena, or bright red blood. She denies any nausea or vomiting. She does admit that she has a history of irritable bowel syndrome for which she gets occasional abdominal cramping and alternating bowel habits, but this has not been any significant change or any new finding since being admitted. She denies noticing any overt GI bleeding. She does complain of left lower extremity pain secondary to the fracture but otherwise is feeling overall well.   Last colonoscopy was actually in September 2014 by Dr. Oh and was normal. She reports she  also had an EGD about 2 years ago but  unfortunately this was done at an outside facility and I have not been able to review this report.   PAST MEDICAL HISTORY: Dyslipidemia, hypertension, degenerative disk disease, migraine headaches, chronic pain, schizophrenia, history of cervical cancer, insomnia, obesity, irritable bowel syndrome.   PAST SURGICAL HISTORY: Hysterectomy, bilateral shoulder surgery, left hand surgery.   HOME MEDICATIONS: Lipitor, diclofenac, Lexapro, gabapentin, aspirin, Lopid, Effexor, Phenergan, and Protonix,   ALLERGIES: CELEBREX, PENICILLIN, AND SULFA.   SOCIAL HISTORY: The patient has a remote history of tobacco use, quitting about 5 years ago. She denies any illicit drug use or current smoking. She reports occasional rare social alcohol use but denies anything in excess.   FAMILY HISTORY: There is no known family history of GI malignancy, colon polyps or IBD.   REVIEW OF SYSTEMS: A 10-system review of systems was obtained on the patient. Pertinent positives are mentioned above and are otherwise negative.   OBJECTIVE:  VITAL SIGNS: Blood pressure 124/80, heart rate 101, respirations 18, temperature 98 degrees. Bedside pulse oximetry is 92%.  GENERAL: A pleasant 49-year-old female resting quietly and comfortably in bed in no acute distress. Alert and oriented x3.  HEAD: Atraumatic, normocephalic.  NECK: Supple. No lymphadenopathy noted.  HEENT: Sclerae anicteric. Mucous membranes moist.  LUNGS: Respirations are even and unlabored. Clear to auscultation bilateral anterior lung fields.  CARDIAC: Regular rate and rhythm. S1, S2 noted.  ABDOMEN: Soft, nontender, nondistended. Normoactive bowel sounds noted in all 4 quadrants. No guarding or rebound. No masses, hernias or organomegaly appreciated.  PSYCHIATRIC: Appropriate mood and   affect.  EXTREMITIES: Left lower extremity cast is noted with bandages and is elevated on pillows.  RECTAL: Deferred.   LABORATORY DATA: White blood cells 5.2, hemoglobin 8.5,  hematocrit 27.6, MCV 74, platelets 142. Sodium 137, potassium 4.4, BUN 9, glucose 137, INR 1.7. PT 20, PTT 90, bilirubin 0.7, alk phos 187, ALT 27, AST 38. Fecal occult blood test is pending collection.   IMAGING: CT angiogram of the chest was positive for bilateral pulmonary embolism.   Ultrasound of the bilateral lower extremities with Doppler was noted and negative for DVT but it  was limited secondary to her calf being in a cast, and therefore, this area of her lower extremity was unable to be observed.   ASSESSMENT:  1.  Microcytic anemia with a hemoglobin today of 8.5, and this has been remaining relatively stable since admission.  2.  Bilateral pulmonary embolism requiring anticoagulation. Currently she is on a heparin-Coumadin bridge.  3.  History of irritable bowel syndrome. Last colonoscopy was September 2014 and was normal. Last esophagogastroduodenoscopy was 2 years ago but I have not been able to review the esophagogastroduodenoscopy report.  4.  Recent fall resulting in a left ankle fracture. The patient is currently in a cast.   PLAN: I have discussed this patient's case in detail with Dr. Arther Dames who is involved in the development of the patient's plan of care. At the present time, there does not seem to be any signs of overt GI bleeding and she has had recent endoscopic intervention with a colonoscopy in September that was normal.   She is feeling overall well and is denying any irregularities to her bowel habits, and her hemoglobin has been staying relatively stable since admission. We are comfortable from a gastroenterology standpoint with her continuing her heparin-Coumadin bridge as clinically directed.   We do recommend that she follow up in the office as an outpatient in about 1 month's time to recheck some labs and determine her need for further workup of iron deficiency anemia should this continue to be a persistent finding.   All questions were answered.   These  services provided by Loren Racer, NP, under collaborative agreement with Dr. Arther Dames.   Thank you so much for this consultation and for allowing [place] to participate in the patient's plan of care.   ____________________________ Corky Sox. Aiyonna Lucado, PA-C kme:np D: 03/28/2013 16:20:33 ET T: 03/28/2013 17:09:25 ET JOB#: [id number]  cc: Corky Sox. Shlome Baldree, PA-C, <Dictator> [place] PA ELECTRONICALLY SIGNED 04/04/2013 12:40

## 2014-04-28 NOTE — Consult Note (Signed)
Chief Complaint:  Subjective/Chief Complaint R emphysematous pyelonephritis No overnight events, no fevers/chills, no nausea/vomiting, tol POs.   VITAL SIGNS/ANCILLARY NOTES: **Vital Signs.:   10-Oct-15 04:51  Vital Signs Type Routine  Temperature Temperature (F) 98.3  Celsius 36.8  Temperature Source oral  Pulse Pulse 75  Respirations Respirations 20  Systolic BP Systolic BP 121  Diastolic BP (mmHg) Diastolic BP (mmHg) 83  Mean BP 95  Pulse Ox % Pulse Ox % 98  Pulse Ox Activity Level  At rest  Oxygen Delivery 2L    14:02  Vital Signs Type Routine  Temperature Temperature (F) 98.3  Celsius 36.8  Temperature Source oral  Pulse Pulse 82  Respirations Respirations 20  Systolic BP Systolic BP 115  Diastolic BP (mmHg) Diastolic BP (mmHg) 79  Mean BP 91  Pulse Ox % Pulse Ox % 96  Pulse Ox Activity Level  At rest  Oxygen Delivery 2L  *Intake and Output.:   Shift 10-Oct-15 15:00  Grand Totals Intake:  1191 Output:  700    Net:  491 24 Hr.:  491  Oral Intake      In:  480  IV (Primary)      In:  553  IV (Secondary)      In:  158  Urine ml     Out:  700  Length of Stay Totals Intake:  14000.1 Output:  [id number]    Net:  3475.1   Brief Assessment:  GEN well developed, well nourished, no acute distress, obese   Cardiac Regular  no murmur  -- carotid bruits  -- LE edema  -- JVD  --Gallop   Respiratory normal resp effort  clear BS   Gastrointestinal Normal   Gastrointestinal details normal Soft  Nontender  Nondistended   EXTR negative cyanosis/clubbing   Additional Physical Exam No CVAT   Lab Results:  Routine Chem:  10-Oct-15 05:21   Result Comment LABS - This specimen was collected through an   - indwelling catheter or arterial line.  - A minimum of 5mls of blood was wasted prior    - to collecting the sample.  Interpret  - results with caution.  Result(s) reported on 14 Oct 2013 at 05:53AM.  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   Plan 1) Continue IV antibiotics for total of 6 weeks 2) Infectious Disease Consultation 3) No acute surgical intervention at this time   Electronic Signatures: Graceann CongressKaplan, Adonijah Baena G (MD)  (Signed 11-Oct-15 09:29)  Authored: Chief Complaint, VITAL SIGNS/ANCILLARY NOTES, Brief Assessment, Lab Results, Assessment/Plan   Last Updated: 11-Oct-15 09:29 by Graceann CongressKaplan, Maurizio Geno G (MD)

## 2014-04-30 LAB — SURGICAL PATHOLOGY

## 2014-06-06 DIAGNOSIS — E162 Hypoglycemia, unspecified: Secondary | ICD-10-CM | POA: Insufficient documentation

## 2014-06-07 ENCOUNTER — Other Ambulatory Visit: Payer: Self-pay

## 2014-06-07 ENCOUNTER — Ambulatory Visit (INDEPENDENT_AMBULATORY_CARE_PROVIDER_SITE_OTHER): Payer: 59 | Admitting: Psychiatry

## 2014-06-07 ENCOUNTER — Encounter: Payer: Self-pay | Admitting: Psychiatry

## 2014-06-07 VITALS — BP 122/82 | HR 94 | Temp 97.8°F | Ht 63.5 in | Wt 221.2 lb

## 2014-06-07 DIAGNOSIS — F209 Schizophrenia, unspecified: Secondary | ICD-10-CM | POA: Insufficient documentation

## 2014-06-07 DIAGNOSIS — F329 Major depressive disorder, single episode, unspecified: Secondary | ICD-10-CM | POA: Diagnosis not present

## 2014-06-07 DIAGNOSIS — M79622 Pain in left upper arm: Secondary | ICD-10-CM | POA: Diagnosis not present

## 2014-06-07 DIAGNOSIS — F32A Depression, unspecified: Secondary | ICD-10-CM | POA: Insufficient documentation

## 2014-06-07 DIAGNOSIS — I10 Essential (primary) hypertension: Secondary | ICD-10-CM

## 2014-06-07 DIAGNOSIS — E785 Hyperlipidemia, unspecified: Secondary | ICD-10-CM | POA: Insufficient documentation

## 2014-06-07 MED ORDER — QUETIAPINE FUMARATE 300 MG PO TABS: 300.0000 mg | ORAL_TABLET | Freq: Every day | ORAL | Status: DC

## 2014-06-07 MED ORDER — LORAZEPAM 0.5 MG PO TABS: 0.5000 mg | ORAL_TABLET | Freq: Three times a day (TID) | ORAL | Status: DC

## 2014-06-07 MED ORDER — HALOPERIDOL 2 MG PO TABS: 2.0000 mg | ORAL_TABLET | Freq: Two times a day (BID) | ORAL | Status: DC

## 2014-06-07 NOTE — Progress Notes (Signed)
BH MD/PA/NP OP Progress Note  06/07/2014 4:01 PM [name] Haas  MRN:  [id number]  Subjective:  "I am not sleeping and I am feeling shaky. No suicidal ideation. I'm still feeling things all the time". Patient reports that she got out of the hospital in late May after [facility]. She went in for delirium and confusion. Still not clear how much of that was related to medicine. They made several changes to her medication essentially stopping all of her psychiatric medicines that I had prescribed and replacing them with Haldol 2 mg 3 times a day and Ativan 0.5 mg 3 times a day. Patient says she is not sleeping well. Tosses and turns at night. She estimates only an hour or 2 of sleep. She denies suicidal ideation and says her mood feels flat. She denies auditory hallucinations but still has delusions and tactile hallucinations as she did before. Blood pressure is been stable. Pain in her arm is chronic and is being controlled with narcotics at this point. Chief Complaint:  Visit Diagnosis:     ICD-9-CM ICD-10-CM   1. Schizophrenia, unspecified type 295.90 F20.9   2. Depression 311 F32.9   3. Essential hypertension 401.9 I10   4. Pain of left upper arm 729.5 M79.622     Past Medical History:  Past Medical History  Diagnosis Date  . Other chronic pain   . Restless legs syndrome (RLS)   . Disorder of bone and cartilage, unspecified   . Unspecified essential hypertension   . Esophageal reflux   . Personal history of malignant neoplasm of cervix uteri   . Irritable bowel syndrome   . Contact dermatitis and other eczema, due to unspecified cause   . Lumbago   . Migraine, unspecified, without mention of intractable migraine without mention of status migrainosus   . Unspecified schizophrenia, unspecified condition   . Personal history of colonic polyps   . Symptomatic menopausal or female climacteric states   . Other and unspecified hyperlipidemia   . Pain in joint, site unspecified   .  Hypertonicity of bladder   . Pain in joint, forearm     Past Surgical History  Procedure Laterality Date  . Breast lumpectomy      left multiple benign  . Hand surgery  1988    left tendon transfer  . Vaginal hysterectomy  2005  . Bilateral oophorectomy  2005  . Cervical cone biopsy  2005  . Shoulder arthroscopy  2007    right  . Retinal detachment surgery  2010    right, repair   Family History:  Family History  Problem Relation Age of Onset  . Skin cancer Father     carcinoma, basal cell  . Melanoma Father   . Anemia Mother   . Drug abuse Brother   . Breast cancer Paternal Grandmother   . Diabetes type II Paternal Grandfather   . Other Father     hay fever  . Other Mother     epilepsy   Social History:  History   Social History  . Marital Status: Single    Spouse Name: N/A  . Number of Children: N/A  . Years of Education: N/A   Social History Main Topics  . Smoking status: Former Smoker    Quit date: 06/06/2012  . Smokeless tobacco: Never Used  . Alcohol Use: No  . Drug Use: No  . Sexual Activity: No   Other Topics Concern  . None   Social History  Narrative   Additional History: Patient is back living at home with her father. Spends the day working around the house. Doesn't have any big plans coming up area she is requesting that we consider switching back to the lorazepam that she was taking previously. It was speculated at [facility] that she may have had too much medication  Assessment:   Musculoskeletal: Strength & Muscle Tone: within normal limits Gait & Station: normal Patient leans: N/A  Psychiatric Specialty Exam: HPI  ROS  Blood pressure 122/82, pulse 94, temperature 97.8 F (36.6 C), temperature source Tympanic, height 5' 3.5" (1.613 m), weight 221 lb 3.2 oz (100.336 kg), SpO2 95 %.Body mass index is 38.56 kg/(m^2).  General Appearance: Casual  Eye Contact:  Good  Speech:  Slow  Volume:  Decreased  Mood:  Anxious  Affect:  Blunt  Thought  Process:  Linear  Orientation:  Full (Time, Place, and Person)  Thought Content:  Delusions and Hallucinations: Tactile  Suicidal Thoughts:  No  Homicidal Thoughts:  No  Memory:  Immediate;   Good Recent;   Good Remote;   Good  Judgement:  Fair  Insight:  Fair  Psychomotor Activity:  Normal  Concentration:  Good  Recall:  Good  Fund of Knowledge: Good  Language: Fair  Akathisia:  No  Handed:  Right  AIMS (if indicated):     Assets:  Desire for Improvement Housing Social Support  ADL's:  Intact  Cognition: WNL  Sleep:  impaired   Is the patient at risk to self?  No. Has the patient been a risk to self in the past 6 months?  No. Has the patient been a risk to self within the distant past?  Yes.   Is the patient a risk to others?  No. Has the patient been a risk to others in the past 6 months?  No. Has the patient been a risk to others within the distant past?  Yes.    Current Medications: Current Outpatient Prescriptions  Medication Sig Dispense Refill  . atorvastatin (LIPITOR) 10 MG tablet   3  . BRISDELLE 7.5 MG CAPS     . diclofenac (VOLTAREN) 75 MG EC tablet   3  . dicyclomine (BENTYL) 20 MG tablet   0  . haloperidol (HALDOL) 2 MG tablet Take 1 tablet (2 mg total) by mouth 2 (two) times daily. 60 tablet 0  . lisinopril (PRINIVIL,ZESTRIL) 10 MG tablet   0  . LORazepam (ATIVAN) 0.5 MG tablet Take 1 tablet (0.5 mg total) by mouth every 8 (eight) hours. 90 tablet 2  . morphine (MS CONTIN) 15 MG 12 hr tablet   0  . MYRBETRIQ 50 MG TB24 tablet   11  . pantoprazole (PROTONIX) 40 MG tablet   6  . QUEtiapine (SEROQUEL) 300 MG tablet Take 1 tablet (300 mg total) by mouth at bedtime. 30 tablet 2  . SYMBICORT 160-4.5 MCG/ACT inhaler   11  . tiZANidine (ZANAFLEX) 4 MG tablet   2  . ARIPiprazole (ABILIFY) 30 MG tablet   3  . clindamycin (CLEOCIN) 300 MG capsule   0  . escitalopram (LEXAPRO) 20 MG tablet   0  . flurazepam (DALMANE) 15 MG capsule   3  . gabapentin (NEURONTIN)  600 MG tablet   4  . glimepiride (AMARYL) 4 MG tablet   3  . hydrOXYzine (VISTARIL) 50 MG capsule   1  . loperamide (IMODIUM) 2 MG capsule   0  . metaxalone (SKELAXIN) 800 MG  tablet   1  . nitrofurantoin, macrocrystal-monohydrate, (MACROBID) 100 MG capsule   0  . oxyCODONE (OXY IR/ROXICODONE) 5 MG immediate release tablet   0  . Oxycodone HCl 10 MG TABS   0  . OXYCONTIN 20 MG T12A 12 hr tablet   0  . ZOSTAVAX 53299 UNT/0.65ML injection   0   No current facility-administered medications for this visit.    Medical Decision Making:  Review of Psycho-Social Stressors (1), Review and summation of old records (2), Established Problem, Worsening (2), Review of Last Therapy Session (1), Review of Medication Regimen & Side Effects (2) and Review of New Medication or Change in Dosage (2)  Treatment Plan Summary:Medication management and Plan Patient is stable but having active psychotic symptoms. Also not sleeping well. Mood is stable but not very good. We reviewed past history and the notes from [facility]. Patient would like to try switching back towards what she was taking previously. I would like to avoid increases in her benzodiazepine's but I did agree to restart Seroquel at 300 mg at night. We will cut the Haldol down to twice a day. I would like to see her back in 6 weeks. Supportive counseling done around her pain. Patient's blood pressure is stable no need for change in treatment. Follow-up in a month and a half. And today was 30 minutes reviewing her recent hospitalization.   John Clapacs 06/07/2014, 4:01 PM

## 2014-06-14 ENCOUNTER — Other Ambulatory Visit: Payer: Self-pay

## 2014-06-14 DIAGNOSIS — S42309A Unspecified fracture of shaft of humerus, unspecified arm, initial encounter for closed fracture: Secondary | ICD-10-CM | POA: Insufficient documentation

## 2014-06-14 DIAGNOSIS — F331 Major depressive disorder, recurrent, moderate: Secondary | ICD-10-CM | POA: Insufficient documentation

## 2014-06-14 DIAGNOSIS — F200 Paranoid schizophrenia: Secondary | ICD-10-CM | POA: Insufficient documentation

## 2014-06-14 DIAGNOSIS — F333 Major depressive disorder, recurrent, severe with psychotic symptoms: Secondary | ICD-10-CM | POA: Insufficient documentation

## 2014-06-14 DIAGNOSIS — F4321 Adjustment disorder with depressed mood: Secondary | ICD-10-CM | POA: Insufficient documentation

## 2014-06-14 MED ORDER — HYDROXYZINE PAMOATE 50 MG PO CAPS: 50.0000 mg | ORAL_CAPSULE | Freq: Two times a day (BID) | ORAL | Status: DC

## 2014-06-14 NOTE — Telephone Encounter (Signed)
received fax requesting hydroxyzine pamoate 50mg   take 1 capsule by mouth twice daily as needed.    Pt was seen on 06-07-14.  No follow up appt made.

## 2014-06-19 ENCOUNTER — Telehealth: Payer: Self-pay | Admitting: Psychiatry

## 2014-06-20 ENCOUNTER — Telehealth: Payer: Self-pay | Admitting: Psychiatry

## 2014-06-22 MED ORDER — LORAZEPAM 0.5 MG PO TABS: 0.5000 mg | ORAL_TABLET | Freq: Three times a day (TID) | ORAL | Status: DC | PRN

## 2014-06-22 MED ORDER — QUETIAPINE FUMARATE 300 MG PO TABS: 600.0000 mg | ORAL_TABLET | Freq: Every day | ORAL | Status: DC

## 2014-06-22 NOTE — Telephone Encounter (Signed)
Patient wants to increase her Seroquel dose because she is not sleeping. No side effects currently. Increase Seroquel dose to 600 mg at night. New prescription sent to [facility]. She also complains that they are not filling her lorazepam. I called in to the [facility] to ensure that they had the correct prescription.

## 2014-07-05 DIAGNOSIS — S82209B Unspecified fracture of shaft of unspecified tibia, initial encounter for open fracture type I or II: Secondary | ICD-10-CM | POA: Insufficient documentation

## 2014-07-05 DIAGNOSIS — S37009A Unspecified injury of unspecified kidney, initial encounter: Secondary | ICD-10-CM | POA: Insufficient documentation

## 2014-07-05 DIAGNOSIS — S82409B Unspecified fracture of shaft of unspecified fibula, initial encounter for open fracture type I or II: Secondary | ICD-10-CM

## 2014-07-06 DIAGNOSIS — D649 Anemia, unspecified: Secondary | ICD-10-CM | POA: Insufficient documentation

## 2014-07-23 ENCOUNTER — Ambulatory Visit: Payer: Self-pay | Admitting: Psychiatry

## 2014-07-24 ENCOUNTER — Ambulatory Visit: Payer: 59 | Admitting: Psychiatry

## 2014-10-18 ENCOUNTER — Other Ambulatory Visit: Payer: Self-pay | Admitting: Psychiatry

## 2014-10-19 ENCOUNTER — Other Ambulatory Visit: Payer: Self-pay | Admitting: Psychiatry

## 2014-11-01 ENCOUNTER — Ambulatory Visit: Payer: Self-pay | Admitting: Pain Medicine

## 2014-11-08 DIAGNOSIS — Z4889 Encounter for other specified surgical aftercare: Secondary | ICD-10-CM | POA: Insufficient documentation

## 2014-11-19 ENCOUNTER — Ambulatory Visit: Payer: Medicare Other | Attending: Pain Medicine | Admitting: Pain Medicine

## 2014-11-19 ENCOUNTER — Other Ambulatory Visit: Payer: Self-pay | Admitting: Pain Medicine

## 2014-11-19 ENCOUNTER — Encounter: Payer: Self-pay | Admitting: Pain Medicine

## 2014-11-19 VITALS — BP 86/61 | HR 97 | Temp 98.0°F | Resp 18 | Ht 65.0 in | Wt 212.0 lb

## 2014-11-19 DIAGNOSIS — K589 Irritable bowel syndrome without diarrhea: Secondary | ICD-10-CM | POA: Insufficient documentation

## 2014-11-19 DIAGNOSIS — Z5181 Encounter for therapeutic drug level monitoring: Secondary | ICD-10-CM | POA: Insufficient documentation

## 2014-11-19 DIAGNOSIS — M5416 Radiculopathy, lumbar region: Secondary | ICD-10-CM | POA: Diagnosis not present

## 2014-11-19 DIAGNOSIS — F17200 Nicotine dependence, unspecified, uncomplicated: Secondary | ICD-10-CM | POA: Insufficient documentation

## 2014-11-19 DIAGNOSIS — G43909 Migraine, unspecified, not intractable, without status migrainosus: Secondary | ICD-10-CM | POA: Insufficient documentation

## 2014-11-19 DIAGNOSIS — M791 Myalgia: Secondary | ICD-10-CM

## 2014-11-19 DIAGNOSIS — M25512 Pain in left shoulder: Secondary | ICD-10-CM

## 2014-11-19 DIAGNOSIS — M13812 Other specified arthritis, left shoulder: Secondary | ICD-10-CM | POA: Diagnosis not present

## 2014-11-19 DIAGNOSIS — Z87448 Personal history of other diseases of urinary system: Secondary | ICD-10-CM

## 2014-11-19 DIAGNOSIS — M792 Neuralgia and neuritis, unspecified: Secondary | ICD-10-CM

## 2014-11-19 DIAGNOSIS — F319 Bipolar disorder, unspecified: Secondary | ICD-10-CM | POA: Insufficient documentation

## 2014-11-19 DIAGNOSIS — F1120 Opioid dependence, uncomplicated: Secondary | ICD-10-CM

## 2014-11-19 DIAGNOSIS — F200 Paranoid schizophrenia: Secondary | ICD-10-CM | POA: Insufficient documentation

## 2014-11-19 DIAGNOSIS — F0390 Unspecified dementia without behavioral disturbance: Secondary | ICD-10-CM | POA: Diagnosis not present

## 2014-11-19 DIAGNOSIS — E785 Hyperlipidemia, unspecified: Secondary | ICD-10-CM | POA: Insufficient documentation

## 2014-11-19 DIAGNOSIS — I10 Essential (primary) hypertension: Secondary | ICD-10-CM | POA: Diagnosis not present

## 2014-11-19 DIAGNOSIS — F1190 Opioid use, unspecified, uncomplicated: Secondary | ICD-10-CM

## 2014-11-19 DIAGNOSIS — G894 Chronic pain syndrome: Secondary | ICD-10-CM

## 2014-11-19 DIAGNOSIS — K219 Gastro-esophageal reflux disease without esophagitis: Secondary | ICD-10-CM | POA: Diagnosis not present

## 2014-11-19 DIAGNOSIS — M545 Low back pain: Secondary | ICD-10-CM

## 2014-11-19 DIAGNOSIS — G8929 Other chronic pain: Secondary | ICD-10-CM

## 2014-11-19 DIAGNOSIS — Z79899 Other long term (current) drug therapy: Secondary | ICD-10-CM | POA: Diagnosis not present

## 2014-11-19 DIAGNOSIS — E1165 Type 2 diabetes mellitus with hyperglycemia: Secondary | ICD-10-CM | POA: Insufficient documentation

## 2014-11-19 DIAGNOSIS — M79603 Pain in arm, unspecified: Secondary | ICD-10-CM | POA: Diagnosis present

## 2014-11-19 DIAGNOSIS — Z9889 Other specified postprocedural states: Secondary | ICD-10-CM | POA: Insufficient documentation

## 2014-11-19 DIAGNOSIS — Z79891 Long term (current) use of opiate analgesic: Secondary | ICD-10-CM

## 2014-11-19 DIAGNOSIS — Z8541 Personal history of malignant neoplasm of cervix uteri: Secondary | ICD-10-CM | POA: Diagnosis not present

## 2014-11-19 DIAGNOSIS — M12819 Other specific arthropathies, not elsewhere classified, unspecified shoulder: Secondary | ICD-10-CM

## 2014-11-19 DIAGNOSIS — M21371 Foot drop, right foot: Secondary | ICD-10-CM

## 2014-11-19 DIAGNOSIS — F331 Major depressive disorder, recurrent, moderate: Secondary | ICD-10-CM | POA: Diagnosis not present

## 2014-11-19 DIAGNOSIS — M79673 Pain in unspecified foot: Secondary | ICD-10-CM | POA: Diagnosis present

## 2014-11-19 DIAGNOSIS — M7918 Myalgia, other site: Secondary | ICD-10-CM

## 2014-11-19 DIAGNOSIS — M19012 Primary osteoarthritis, left shoulder: Secondary | ICD-10-CM

## 2014-11-19 MED ORDER — OXYCODONE HCL 10 MG PO TABS: 10.0000 mg | ORAL_TABLET | Freq: Three times a day (TID) | ORAL | Status: DC | PRN

## 2014-11-19 MED ORDER — TIZANIDINE HCL 4 MG PO TABS: 4.0000 mg | ORAL_TABLET | Freq: Three times a day (TID) | ORAL | Status: DC | PRN

## 2014-11-19 MED ORDER — MORPHINE SULFATE ER 15 MG PO TBCR: 15.0000 mg | EXTENDED_RELEASE_TABLET | Freq: Two times a day (BID) | ORAL | Status: DC

## 2014-11-19 NOTE — Progress Notes (Signed)
Patient's Name: [name] Haas MRN: [id number] DOB: 07/25/1965 DOS: 11/19/2014  Primary Reason(s) for Visit: Encounter for Medication Management. CC: Arm Pain and Foot Pain   HPI:   [name] Haas is a 49 y.o. year old, female patient, who returns today as an established patient. 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[place]); Compulsive tobacco user syndrome; Paranoid schizophrenia ([place]); Back ache; Arm fracture; Depression, major, recurrent, moderate ([place]); Aggrieved; Major depressive disorder, recurrent episode, severe, with psychotic behavior ([place]); Injury of kidney; Absolute anemia; Bipolar affective disorder ([place]); Encounter for surgical follow-up care; Morbid obesity ([place]); Fracture of shaft of tibia and fibula, open; Type 2 diabetes mellitus with hyperglycemia ([place]); Urge incontinence of urine; Long term current use of opiate analgesic; Long term prescription opiate use; Opiate use; Opiate dependence ([place]); Encounter for therapeutic drug level monitoring; Lumbar radiculopathy, acute (Right L5 Radiculopathy. "Drop foot"); Myofascial pain; Neurogenic pain; Foot drop, right; Chronic pain of right lower extremity; Chronic left shoulder pain; Arthropathy of left shoulder; Chronic low back pain; History of renal failure and respiratory acidosis (05/24/2014); and Chronic pain syndrome on her problem list.. Her primarily concern today is the Arm Pain and Foot Pain    The patient returns to the clinic today after last time being seen on 06/28/2014. At that time we had given her a prescription for extended release morphine 15 mg to be taken one tablet by mouth twice a day plus oxycodone 10 mg to be taken one tablet by mouth 3 times a day when necessary  for breakthrough pain. It turns out that patient had a fall on 07/05/2014 and she broke her left leg (left tibia and ankle).) The patient was kept at the [facility] health care for 3 months and she went back home on 10/06/2014. She indicates that she was there [name] Haas was receiving medications from them and therefore she did not have to use the medications that we have prescribed. Since she had not filled her oxycodone, she had it filled on 10/06/2014 and although she had filled them morphine on 06/28/2014, she didn't have to use any until she was released on 10/06/2014 and therefore she had enough until coming here. She refers that she ran out of medication 3 days ago and interestingly, [name] Haas she does not look like somebody that would be withdrawn. In looking at her medical records, it would seem that the patient has had several falls and fractures which are wonder why. In looking at her medications, I see that she was also taking lorazepam which I have instructed her to never take it again since the combination of opioids and benzodiazepines could result in respiratory depression and death. She indicates that she no longer takes this medication.  She comes in today with evidence of a right "drop foot". This seems to be Because of this today we'll we have ordered an MRI of the lumbar spine. The patient also is scheduled to have a nerve conduction test done on 12/06/2014. She does not remember the name of the practice that we will be doing the nerve conduction testing, but I have instructed her to take one of my business cards and give it to the neurologist so that they can send me a copy of the results. She came in today on a wheelchair. She continues to be morbidly obese,  which is not helping with her chronic pain condition. Today's Pain Score: 6  Reported level of pain is incompatible with clinical obrservations. This may be secondary to a possible lack of understanding on how the pain scale works. Pain Type: Chronic  pain Pain Location: Arm (left arm, both feet) Pain Descriptors / Indicators: Shooting, Throbbing Pain Frequency: Constant  Date of Last Visit: Date of Last Visit: 06/28/14 Service Provided on Last Visit: Service Provided on Last Visit: Med Refill  Pharmacotherapy Review:   Side-effects or Adverse reactions: None reported. Effectiveness: Described as relatively effective, allowing for increase in activities of daily living (ADL). Onset of action: Within expected pharmacological parameters. Duration of action: Within normal limits for medication. Peak effect: Timing and results are as within normal expected parameters. [place] PMP: Compliant with practice rules and regulations. Last UDS available in the system:     Component Value Date/Time   LABOPIA POSITIVE 09/04/2011 1913   LABBENZ POSITIVE 09/04/2011 1913   AMPHETMU NEGATIVE 09/04/2011 1913   THCU NEGATIVE 09/04/2011 1913   LABBARB POSITIVE 09/04/2011 1913    No components found for: DRUGS OF ABUSE SCREEN W/O ALC UDS: Compliant with practice rules and regulations. Lab work: No new labs ordered by our practice. Treatment compliance: Compliant. Substance Use Disorder (SUD) Risk Level: Low Planned course of action: Continue therapy as is.  Allergies: [name] Haas is allergic to alcohol; bextra; celebrex; penicillins; sulfa antibiotics; sulfacetamide sodium; vioxx; and metformin.  Meds: The patient has a current medication list which includes the following prescription(s): atorvastatin, brisdelle, dicyclomine, haloperidol, morphine, myrbetriq, oxycodone hcl, pantoprazole, quetiapine, tizanidine, vitamin d (ergocalciferol), and aripiprazole. Requested Prescriptions   Signed Prescriptions Disp Refills  . morphine (MS CONTIN) 15 MG 12 hr tablet 60 tablet 0    Sig: Take 1 tablet (15 mg total) by mouth every 12 (twelve) hours.  . Oxycodone HCl 10 MG TABS 90 tablet 0    Sig: Take 1 tablet (10 mg total) by mouth every 8 (eight) hours as  needed.  Marland Kitchen tiZANidine (ZANAFLEX) 4 MG tablet 90 tablet 0    Sig: Take 1 tablet (4 mg total) by mouth every 8 (eight) hours as needed.    ROS: Constitutional: Afebrile, no chills, well hydrated and well nourished Gastrointestinal: negative Musculoskeletal:negative Neurological: negative Behavioral/Psych: negative  PFSH: Medical:  [name] Haas  has a past medical history of Other chronic pain; Restless legs syndrome (RLS); Disorder of bone and cartilage, unspecified; Unspecified essential hypertension; Esophageal reflux; Personal history of malignant neoplasm of cervix uteri; Irritable bowel syndrome; Contact dermatitis and other eczema, due to unspecified cause; Lumbago; Migraine, unspecified, without mention of intractable migraine without mention of status migrainosus; Unspecified schizophrenia, unspecified condition; Personal history of colonic polyps; Symptomatic menopausal or female climacteric states; Other and unspecified hyperlipidemia; Pain in joint, site unspecified; Hypertonicity of bladder; Pain in joint, forearm; and History of renal failure and respiratory acidosis (05/24/2014) (11/20/2014). Family: family history includes Anemia in her mother; Breast cancer in her paternal grandmother; Diabetes type II in her paternal grandfather; Drug abuse in her brother; Melanoma in her father; Other in her father and mother; Skin cancer in her father. Surgical:  has past surgical history that includes Breast lumpectomy; Hand surgery (1988); Vaginal hysterectomy (2005); Bilateral oophorectomy (2005); Cervical cone biopsy (2005); Shoulder arthroscopy (2007); and Retinal detachment surgery (2010). Tobacco:  reports that she quit smoking about 2 years ago. She has never used smokeless tobacco. Alcohol:  reports that she does not drink alcohol. Drug:  reports that  she does not use illicit drugs.  Physical Exam: Vitals:  Today's Vitals   11/19/14 1113 11/19/14 1114  BP:  86/61  Pulse: 97   Temp:  98 F (36.7 C)   Resp: 18   Height: 5' 5"  (1.651 m)   Weight: 212 lb (96.163 kg)   SpO2: 95%   PainSc: 6  6   Calculated BMI: Body mass index is 35.28 kg/(m^2). General appearance: alert, cooperative, appears stated age, distracted, mild distress and morbidly obese Eyes: conjunctivae/corneas clear. PERRL, EOM's intact. Fundi benign. Lungs: No evidence respiratory distress, no audible rales or ronchi and no use of accessory muscles of respiration Neck: no adenopathy, no carotid bruit, no JVD, supple, symmetrical, trachea midline and thyroid not enlarged, symmetric, no tenderness/mass/nodules Back: symmetric, no curvature. ROM normal. No CVA tenderness. Extremities: The patient comes in and now wheelchair with clear evidence of a right drop foot. In addition she has a brace on the left leg from a recent fall. Pulses: 2+ and symmetric Skin: Skin color, texture, turgor normal. No rashes or lesions Neurologic: Gait: Drop-foot    Assessment: Encounter Diagnosis:  Primary Diagnosis: Chronic left shoulder pain [M25.512, G89.29]  Plan: Haas was seen today for arm pain and foot pain.  Diagnoses and all orders for this visit:  Chronic left shoulder pain  Long term current use of opiate analgesic -     Drugs of abuse screen w/o alc, rtn urine-sln -     Drugs of abuse screen w/o alc, rtn urine-sln; Standing  Long term prescription opiate use  Opiate use -     morphine (MS CONTIN) 15 MG 12 hr tablet; Take 1 tablet (15 mg total) by mouth every 12 (twelve) hours. -     Oxycodone HCl 10 MG TABS; Take 1 tablet (10 mg total) by mouth every 8 (eight) hours as needed. -     tiZANidine (ZANAFLEX) 4 MG tablet; Take 1 tablet (4 mg total) by mouth every 8 (eight) hours as needed.  Uncomplicated opioid dependence ([place] )  Encounter for therapeutic drug level monitoring  Chronic pain  Lumbar radiculopathy, acute (Right L5 Radiculopathy. "Drop foot") -     MR Lumbar Spine Wo Contrast;  Future  Myofascial pain  Neurogenic pain  Foot drop, right  Arthropathy of left shoulder  Chronic low back pain  History of renal failure and respiratory acidosis (05/24/2014)  Chronic pain syndrome     Patient Instructions  Make a return appointment after having MRI.    Medications discontinued today:  Medications Discontinued During This Encounter  Medication Reason  . acetaminophen (TYLENOL) 325 MG tablet Error  . Blood Glucose Monitoring Suppl (ONE TOUCH ULTRA 2) W/DEVICE KIT Error  . clindamycin (CLEOCIN) 300 MG capsule Error  . diclofenac (VOLTAREN) 75 MG EC tablet Error  . escitalopram (LEXAPRO) 20 MG tablet Error  . ferrous sulfate 325 (65 FE) MG tablet Error  . flurazepam (DALMANE) 15 MG capsule Error  . gabapentin (NEURONTIN) 600 MG tablet Error  . glimepiride (AMARYL) 4 MG tablet Error  . glucose blood test strip Error  . HYDROcodone-acetaminophen (NORCO/VICODIN) 5-325 MG per tablet Error  . hydrOXYzine (VISTARIL) 50 MG capsule Error  . lisinopril (PRINIVIL,ZESTRIL) 10 MG tablet Error  . loperamide (IMODIUM) 2 MG capsule Error  . LORazepam (ATIVAN) 0.5 MG tablet Error  . LORazepam (ATIVAN) 0.5 MG tablet Error  . metaxalone (SKELAXIN) 800 MG tablet Error  . nitrofurantoin, macrocrystal-monohydrate, (MACROBID) 100 MG capsule Error  . [place] LANCETS FINE  MISC Error  . oxyCODONE (OXY IR/ROXICODONE) 5 MG immediate release tablet Error  . OXYCONTIN 20 MG T12A 12 hr tablet Error  . polyethylene glycol (MIRALAX / GLYCOLAX) packet Error  . potassium chloride SA (K-DUR,KLOR-CON) 20 MEQ tablet Error  . senna (SENOKOT) 8.6 MG tablet Error  . SYMBICORT 160-4.5 MCG/ACT inhaler Error  . venlafaxine (EFFEXOR) 75 MG tablet Error  . ZOSTAVAX 20919 UNT/0.65ML injection Error  . morphine (MS CONTIN) 15 MG 12 hr tablet Reorder  . Oxycodone HCl 10 MG TABS Reorder  . tiZANidine (ZANAFLEX) 4 MG tablet Reorder   Medications administered today:  [name] Haas had no  medications administered during this visit.  Primary Care Physician: Junius Finner, MD Location: [facility] Note by: Kathlen Brunswick Dossie Arbour, M.D, DABA, DABAPM, DABPM, DABIPP, FIPP

## 2014-11-19 NOTE — Progress Notes (Signed)
Safety precautions to be maintained throughout the outpatient stay will include: orient to surroundings, keep bed in low position, maintain call bell within reach at all times, provide assistance with transfer out of bed and ambulation. Patient states she is out of medications and has been out for about 3 days.

## 2014-11-19 NOTE — Patient Instructions (Signed)
Make a return appointment after having MRI.

## 2014-11-20 ENCOUNTER — Encounter: Payer: Self-pay | Admitting: Pain Medicine

## 2014-11-20 DIAGNOSIS — G894 Chronic pain syndrome: Secondary | ICD-10-CM | POA: Insufficient documentation

## 2014-11-20 DIAGNOSIS — M25512 Pain in left shoulder: Secondary | ICD-10-CM

## 2014-11-20 DIAGNOSIS — G8929 Other chronic pain: Secondary | ICD-10-CM | POA: Insufficient documentation

## 2014-11-20 DIAGNOSIS — Z87448 Personal history of other diseases of urinary system: Secondary | ICD-10-CM

## 2014-11-20 DIAGNOSIS — M19012 Primary osteoarthritis, left shoulder: Secondary | ICD-10-CM | POA: Insufficient documentation

## 2014-11-20 DIAGNOSIS — M545 Low back pain: Secondary | ICD-10-CM

## 2014-11-20 HISTORY — DX: Personal history of other diseases of urinary system: Z87.448

## 2014-11-26 LAB — TOXASSURE SELECT 13 (MW), URINE: PDF: 0

## 2014-12-12 ENCOUNTER — Other Ambulatory Visit: Payer: Self-pay | Admitting: Psychiatry

## 2014-12-13 ENCOUNTER — Inpatient Hospital Stay: Payer: Medicare Other

## 2014-12-13 ENCOUNTER — Emergency Department: Payer: Medicare Other

## 2014-12-13 ENCOUNTER — Inpatient Hospital Stay
Admission: EM | Admit: 2014-12-13 | Discharge: 2014-12-23 | DRG: 682 | Disposition: A | Discharge status: Home Health | Payer: Medicare Other | Attending: Internal Medicine | Admitting: Internal Medicine

## 2014-12-13 DIAGNOSIS — R0902 Hypoxemia: Secondary | ICD-10-CM | POA: Diagnosis present

## 2014-12-13 DIAGNOSIS — F319 Bipolar disorder, unspecified: Secondary | ICD-10-CM | POA: Diagnosis present

## 2014-12-13 DIAGNOSIS — Z820 Family history of epilepsy and other diseases of the nervous system: Secondary | ICD-10-CM

## 2014-12-13 DIAGNOSIS — N170 Acute kidney failure with tubular necrosis: Principal | ICD-10-CM | POA: Diagnosis present

## 2014-12-13 DIAGNOSIS — D649 Anemia, unspecified: Secondary | ICD-10-CM | POA: Diagnosis present

## 2014-12-13 DIAGNOSIS — G9341 Metabolic encephalopathy: Secondary | ICD-10-CM | POA: Diagnosis present

## 2014-12-13 DIAGNOSIS — I129 Hypertensive chronic kidney disease with stage 1 through stage 4 chronic kidney disease, or unspecified chronic kidney disease: Secondary | ICD-10-CM | POA: Diagnosis present

## 2014-12-13 DIAGNOSIS — N19 Unspecified kidney failure: Secondary | ICD-10-CM

## 2014-12-13 DIAGNOSIS — Z8739 Personal history of other diseases of the musculoskeletal system and connective tissue: Secondary | ICD-10-CM

## 2014-12-13 DIAGNOSIS — B952 Enterococcus as the cause of diseases classified elsewhere: Secondary | ICD-10-CM | POA: Diagnosis present

## 2014-12-13 DIAGNOSIS — Z833 Family history of diabetes mellitus: Secondary | ICD-10-CM

## 2014-12-13 DIAGNOSIS — Z8541 Personal history of malignant neoplasm of cervix uteri: Secondary | ICD-10-CM

## 2014-12-13 DIAGNOSIS — G2581 Restless legs syndrome: Secondary | ICD-10-CM | POA: Diagnosis present

## 2014-12-13 DIAGNOSIS — R4182 Altered mental status, unspecified: Secondary | ICD-10-CM

## 2014-12-13 DIAGNOSIS — G894 Chronic pain syndrome: Secondary | ICD-10-CM | POA: Diagnosis present

## 2014-12-13 DIAGNOSIS — Z880 Allergy status to penicillin: Secondary | ICD-10-CM | POA: Diagnosis not present

## 2014-12-13 DIAGNOSIS — N390 Urinary tract infection, site not specified: Secondary | ICD-10-CM | POA: Diagnosis present

## 2014-12-13 DIAGNOSIS — M21372 Foot drop, left foot: Secondary | ICD-10-CM | POA: Diagnosis present

## 2014-12-13 DIAGNOSIS — K580 Irritable bowel syndrome with diarrhea: Secondary | ICD-10-CM | POA: Diagnosis present

## 2014-12-13 DIAGNOSIS — N318 Other neuromuscular dysfunction of bladder: Secondary | ICD-10-CM | POA: Diagnosis present

## 2014-12-13 DIAGNOSIS — E785 Hyperlipidemia, unspecified: Secondary | ICD-10-CM | POA: Diagnosis present

## 2014-12-13 DIAGNOSIS — Z86711 Personal history of pulmonary embolism: Secondary | ICD-10-CM

## 2014-12-13 DIAGNOSIS — R1310 Dysphagia, unspecified: Secondary | ICD-10-CM | POA: Diagnosis present

## 2014-12-13 DIAGNOSIS — E876 Hypokalemia: Secondary | ICD-10-CM | POA: Diagnosis present

## 2014-12-13 DIAGNOSIS — K219 Gastro-esophageal reflux disease without esophagitis: Secondary | ICD-10-CM | POA: Diagnosis present

## 2014-12-13 DIAGNOSIS — F203 Undifferentiated schizophrenia: Secondary | ICD-10-CM

## 2014-12-13 DIAGNOSIS — N183 Chronic kidney disease, stage 3 (moderate): Secondary | ICD-10-CM | POA: Diagnosis present

## 2014-12-13 DIAGNOSIS — Z87891 Personal history of nicotine dependence: Secondary | ICD-10-CM | POA: Diagnosis not present

## 2014-12-13 DIAGNOSIS — R451 Restlessness and agitation: Secondary | ICD-10-CM | POA: Diagnosis not present

## 2014-12-13 DIAGNOSIS — M4806 Spinal stenosis, lumbar region: Secondary | ICD-10-CM | POA: Diagnosis present

## 2014-12-13 DIAGNOSIS — Z803 Family history of malignant neoplasm of breast: Secondary | ICD-10-CM | POA: Diagnosis not present

## 2014-12-13 DIAGNOSIS — Z1621 Resistance to vancomycin: Secondary | ICD-10-CM | POA: Diagnosis present

## 2014-12-13 DIAGNOSIS — Z96611 Presence of right artificial shoulder joint: Secondary | ICD-10-CM | POA: Diagnosis present

## 2014-12-13 DIAGNOSIS — N179 Acute kidney failure, unspecified: Secondary | ICD-10-CM

## 2014-12-13 DIAGNOSIS — J811 Chronic pulmonary edema: Secondary | ICD-10-CM

## 2014-12-13 DIAGNOSIS — E872 Acidosis, unspecified: Secondary | ICD-10-CM

## 2014-12-13 DIAGNOSIS — G9340 Encephalopathy, unspecified: Secondary | ICD-10-CM | POA: Diagnosis present

## 2014-12-13 DIAGNOSIS — R404 Transient alteration of awareness: Secondary | ICD-10-CM

## 2014-12-13 DIAGNOSIS — Z86010 Personal history of colonic polyps: Secondary | ICD-10-CM | POA: Diagnosis not present

## 2014-12-13 DIAGNOSIS — E875 Hyperkalemia: Secondary | ICD-10-CM | POA: Diagnosis present

## 2014-12-13 DIAGNOSIS — E878 Other disorders of electrolyte and fluid balance, not elsewhere classified: Secondary | ICD-10-CM

## 2014-12-13 LAB — BLOOD GAS, ARTERIAL
ACID-BASE DEFICIT: 13.6 mmol/L — AB (ref 0.0–2.0)
Allens test (pass/fail): POSITIVE — AB
Bicarbonate: 13.3 mEq/L — ABNORMAL LOW (ref 21.0–28.0)
FIO2: 0.32
O2 SAT: 91 %
PCO2 ART: 34 mmHg (ref 32.0–48.0)
PO2 ART: 75 mmHg — AB (ref 83.0–108.0)
Patient temperature: 37
pH, Arterial: 7.2 — ABNORMAL LOW (ref 7.350–7.450)

## 2014-12-13 LAB — URINALYSIS COMPLETE WITH MICROSCOPIC (ARMC ONLY)
BILIRUBIN URINE: NEGATIVE
Glucose, UA: NEGATIVE mg/dL
HGB URINE DIPSTICK: NEGATIVE
KETONES UR: NEGATIVE mg/dL
NITRITE: NEGATIVE
PH: 5 (ref 5.0–8.0)
PROTEIN: 30 mg/dL — AB
SPECIFIC GRAVITY, URINE: 1.02 (ref 1.005–1.030)

## 2014-12-13 LAB — CBC
HCT: 25.1 % — ABNORMAL LOW (ref 35.0–47.0)
HCT: 23.2 % — ABNORMAL LOW (ref 35.0–47.0)
HEMATOCRIT: 28.2 % — AB (ref 35.0–47.0)
Hemoglobin: 8.7 g/dL — ABNORMAL LOW (ref 12.0–16.0)
Hemoglobin: 7.8 g/dL — ABNORMAL LOW (ref 12.0–16.0)
Hemoglobin: 7.6 g/dL — ABNORMAL LOW (ref 12.0–16.0)
MCH: 27.6 pg (ref 26.0–34.0)
MCH: 27.5 pg (ref 26.0–34.0)
MCH: 28.3 pg (ref 26.0–34.0)
MCHC: 30.8 g/dL — AB (ref 32.0–36.0)
MCHC: 31.2 g/dL — ABNORMAL LOW (ref 32.0–36.0)
MCHC: 32.6 g/dL (ref 32.0–36.0)
MCV: 89.5 fL (ref 80.0–100.0)
MCV: 88.4 fL (ref 80.0–100.0)
MCV: 86.6 fL (ref 80.0–100.0)
PLATELETS: 216 10*3/uL (ref 150–440)
Platelets: 284 10*3/uL (ref 150–440)
Platelets: 191 10*3/uL (ref 150–440)
RBC: 3.16 MIL/uL — ABNORMAL LOW (ref 3.80–5.20)
RBC: 2.84 MIL/uL — ABNORMAL LOW (ref 3.80–5.20)
RBC: 2.67 MIL/uL — ABNORMAL LOW (ref 3.80–5.20)
RDW: 17.6 % — AB (ref 11.5–14.5)
RDW: 17.3 % — AB (ref 11.5–14.5)
RDW: 17.1 % — AB (ref 11.5–14.5)
WBC: 8.8 10*3/uL (ref 3.6–11.0)
WBC: 5.5 10*3/uL (ref 3.6–11.0)
WBC: 4.3 10*3/uL (ref 3.6–11.0)

## 2014-12-13 LAB — COMPREHENSIVE METABOLIC PANEL
ALBUMIN: 3 g/dL — AB (ref 3.5–5.0)
ALK PHOS: 105 U/L (ref 38–126)
ALK PHOS: 92 U/L (ref 38–126)
ALT: 16 U/L (ref 14–54)
ALT: 15 U/L (ref 14–54)
ANION GAP: 19 — AB (ref 5–15)
AST: 32 U/L (ref 15–41)
AST: 29 U/L (ref 15–41)
Albumin: 2.7 g/dL — ABNORMAL LOW (ref 3.5–5.0)
Anion gap: 14 (ref 5–15)
BILIRUBIN TOTAL: 0.9 mg/dL (ref 0.3–1.2)
BUN: 63 mg/dL — ABNORMAL HIGH (ref 6–20)
BUN: 67 mg/dL — ABNORMAL HIGH (ref 6–20)
CALCIUM: 8.3 mg/dL — AB (ref 8.9–10.3)
CALCIUM: 8 mg/dL — AB (ref 8.9–10.3)
CO2: 13 mmol/L — ABNORMAL LOW (ref 22–32)
CO2: 16 mmol/L — AB (ref 22–32)
CREATININE: 7.46 mg/dL — AB (ref 0.44–1.00)
Chloride: 104 mmol/L (ref 101–111)
Chloride: 107 mmol/L (ref 101–111)
Creatinine, Ser: 7.53 mg/dL — ABNORMAL HIGH (ref 0.44–1.00)
GFR calc non Af Amer: 6 mL/min — ABNORMAL LOW (ref >60)
GFR, EST AFRICAN AMERICAN: 6 mL/min — AB (ref >60)
GFR, EST AFRICAN AMERICAN: 7 mL/min — AB (ref >60)
GFR, EST NON AFRICAN AMERICAN: 6 mL/min — AB (ref >60)
GLUCOSE: 103 mg/dL — AB (ref 65–99)
Glucose, Bld: 78 mg/dL (ref 65–99)
POTASSIUM: 6.6 mmol/L — AB (ref 3.5–5.1)
Potassium: >7.5 mmol/L (ref 3.5–5.1)
SODIUM: 136 mmol/L (ref 135–145)
SODIUM: 137 mmol/L (ref 135–145)
TOTAL PROTEIN: 6.8 g/dL (ref 6.5–8.1)
Total Bilirubin: 0.9 mg/dL (ref 0.3–1.2)
Total Protein: 6.2 g/dL — ABNORMAL LOW (ref 6.5–8.1)

## 2014-12-13 LAB — OSMOLALITY: OSMOLALITY: 312 mosm/kg — AB (ref 275–295)

## 2014-12-13 LAB — GLUCOSE, CAPILLARY
GLUCOSE-CAPILLARY: 101 mg/dL — AB (ref 65–99)
Glucose-Capillary: 97 mg/dL (ref 65–99)

## 2014-12-13 LAB — CREATININE, SERUM
CREATININE: 7.7 mg/dL — AB (ref 0.44–1.00)
GFR, EST AFRICAN AMERICAN: 6 mL/min — AB (ref >60)
GFR, EST NON AFRICAN AMERICAN: 5 mL/min — AB (ref >60)

## 2014-12-13 LAB — BASIC METABOLIC PANEL
ANION GAP: 14 (ref 5–15)
Anion gap: 14 (ref 5–15)
BUN: 68 mg/dL — ABNORMAL HIGH (ref 6–20)
BUN: 47 mg/dL — AB (ref 6–20)
CALCIUM: 7.8 mg/dL — AB (ref 8.9–10.3)
CHLORIDE: 106 mmol/L (ref 101–111)
CHLORIDE: 103 mmol/L (ref 101–111)
CO2: 19 mmol/L — ABNORMAL LOW (ref 22–32)
CO2: 24 mmol/L (ref 22–32)
CREATININE: 7.54 mg/dL — AB (ref 0.44–1.00)
Calcium: 7.7 mg/dL — ABNORMAL LOW (ref 8.9–10.3)
Creatinine, Ser: 5.12 mg/dL — ABNORMAL HIGH (ref 0.44–1.00)
GFR calc Af Amer: 10 mL/min — ABNORMAL LOW (ref >60)
GFR calc non Af Amer: 6 mL/min — ABNORMAL LOW (ref >60)
GFR, EST AFRICAN AMERICAN: 6 mL/min — AB (ref >60)
GFR, EST NON AFRICAN AMERICAN: 9 mL/min — AB (ref >60)
GLUCOSE: 112 mg/dL — AB (ref 65–99)
Glucose, Bld: 97 mg/dL (ref 65–99)
POTASSIUM: 5 mmol/L (ref 3.5–5.1)
Potassium: 7.4 mmol/L (ref 3.5–5.1)
SODIUM: 139 mmol/L (ref 135–145)
Sodium: 141 mmol/L (ref 135–145)

## 2014-12-13 LAB — BLOOD GAS, VENOUS
Acid-base deficit: 17.1 mmol/L — ABNORMAL HIGH (ref 0.0–2.0)
BICARBONATE: 13.7 meq/L — AB (ref 21.0–28.0)
PCO2 VEN: 52 mmHg (ref 44.0–60.0)
PH VEN: 7.03 — AB (ref 7.320–7.430)
Patient temperature: 37

## 2014-12-13 LAB — MAGNESIUM
MAGNESIUM: 2.1 mg/dL (ref 1.7–2.4)
Magnesium: 2.3 mg/dL (ref 1.7–2.4)

## 2014-12-13 LAB — SALICYLATE LEVEL: Salicylate Lvl: <4 mg/dL (ref 2.8–30.0)

## 2014-12-13 LAB — URINE DRUG SCREEN, QUALITATIVE (ARMC ONLY)
AMPHETAMINES, UR SCREEN: NOT DETECTED
BENZODIAZEPINE, UR SCRN: POSITIVE — AB
Barbiturates, Ur Screen: NOT DETECTED
CANNABINOID 50 NG, UR ~~LOC~~: NOT DETECTED
Cocaine Metabolite,Ur ~~LOC~~: NOT DETECTED
MDMA (Ecstasy)Ur Screen: NOT DETECTED
Methadone Scn, Ur: NOT DETECTED
OPIATE, UR SCREEN: POSITIVE — AB
PHENCYCLIDINE (PCP) UR S: NOT DETECTED
Tricyclic, Ur Screen: POSITIVE — AB

## 2014-12-13 LAB — TSH: TSH: 2.906 u[IU]/mL (ref 0.350–4.500)

## 2014-12-13 LAB — ACETAMINOPHEN LEVEL

## 2014-12-13 LAB — MRSA PCR SCREENING: MRSA BY PCR: POSITIVE — AB

## 2014-12-13 LAB — HEMOGLOBIN A1C: Hgb A1c MFr Bld: 4.6 % (ref 4.0–6.0)

## 2014-12-13 LAB — LACTIC ACID, PLASMA
LACTIC ACID, VENOUS: 1.6 mmol/L (ref 0.5–2.0)
Lactic Acid, Venous: 1.4 mmol/L (ref 0.5–2.0)

## 2014-12-13 LAB — PHOSPHORUS: PHOSPHORUS: 9.6 mg/dL — AB (ref 2.5–4.6)

## 2014-12-13 LAB — AMMONIA: Ammonia: 36 umol/L — ABNORMAL HIGH (ref 9–35)

## 2014-12-13 LAB — ETHANOL: Alcohol, Ethyl (B): <5 mg/dL (ref <5)

## 2014-12-13 MED ORDER — INSULIN ASPART 100 UNIT/ML ~~LOC~~ SOLN: 5.0000 [IU] | Freq: Once | SUBCUTANEOUS | Status: DC

## 2014-12-13 MED ORDER — SODIUM BICARBONATE 8.4 % IV SOLN
INTRAVENOUS | Status: DC
  Administered 2014-12-13: 16:00:00 via INTRAVENOUS
  Filled 2014-12-13 (×3): qty 150

## 2014-12-13 MED ORDER — MIDAZOLAM HCL 2 MG/2ML IJ SOLN
INTRAMUSCULAR | Status: AC
  Administered 2014-12-13: 17:00:00
  Filled 2014-12-13: qty 2

## 2014-12-13 MED ORDER — SODIUM BICARBONATE 8.4 % IV SOLN
100.0000 meq | Freq: Once | INTRAVENOUS | Status: AC
  Administered 2014-12-13: 100 meq via INTRAVENOUS

## 2014-12-13 MED ORDER — TUBERCULIN PPD 5 UNIT/0.1ML ID SOLN
5.0000 [IU] | Freq: Once | INTRADERMAL | Status: AC
  Administered 2014-12-14: 5 [IU] via INTRADERMAL
  Filled 2014-12-13: qty 0.1

## 2014-12-13 MED ORDER — SODIUM CHLORIDE 0.9 % IV SOLN
1.0000 g | Freq: Once | INTRAVENOUS | Status: AC
  Administered 2014-12-13: 1 g via INTRAVENOUS
  Filled 2014-12-13: qty 10

## 2014-12-13 MED ORDER — ACETAMINOPHEN 650 MG RE SUPP: 650.0000 mg | Freq: Four times a day (QID) | RECTAL | Status: DC | PRN

## 2014-12-13 MED ORDER — SODIUM BICARBONATE 8.4 % IV SOLN
INTRAVENOUS | Status: AC
  Filled 2014-12-13: qty 50

## 2014-12-13 MED ORDER — CIPROFLOXACIN IN D5W 400 MG/200ML IV SOLN
400.0000 mg | Freq: Once | INTRAVENOUS | Status: AC
  Administered 2014-12-13: 400 mg via INTRAVENOUS
  Filled 2014-12-13: qty 200

## 2014-12-13 MED ORDER — NOREPINEPHRINE BITARTRATE 1 MG/ML IV SOLN
0.0000 ug/min | INTRAVENOUS | Status: DC
  Administered 2014-12-14: 70 ug/min via INTRAVENOUS
  Administered 2014-12-14: 50 ug/min via INTRAVENOUS
  Administered 2014-12-15: 33 ug/min via INTRAVENOUS
  Filled 2014-12-13 (×7): qty 16

## 2014-12-13 MED ORDER — SODIUM CHLORIDE 0.9 % IV SOLN
500.0000 mg | Freq: Two times a day (BID) | INTRAVENOUS | Status: DC
  Administered 2014-12-13 – 2014-12-16 (×7): 500 mg via INTRAVENOUS
  Filled 2014-12-13 (×8): qty 0.5

## 2014-12-13 MED ORDER — MIDAZOLAM HCL 2 MG/2ML IJ SOLN: 2.0000 mg | Freq: Once | INTRAMUSCULAR | Status: DC

## 2014-12-13 MED ORDER — MORPHINE SULFATE (PF) 2 MG/ML IV SOLN: 2.0000 mg | Freq: Once | INTRAVENOUS | Status: DC

## 2014-12-13 MED ORDER — NOREPINEPHRINE BITARTRATE 1 MG/ML IV SOLN
0.0000 ug/min | INTRAVENOUS | Status: DC
  Administered 2014-12-13: 10 ug/min via INTRAVENOUS
  Administered 2014-12-13: 40 ug/min via INTRAVENOUS
  Filled 2014-12-13 (×2): qty 4

## 2014-12-13 MED ORDER — SODIUM CHLORIDE 0.9 % IV SOLN: 1.0000 g | Freq: Three times a day (TID) | INTRAVENOUS | Status: DC

## 2014-12-13 MED ORDER — HEPARIN SODIUM (PORCINE) 5000 UNIT/ML IJ SOLN
5000.0000 [IU] | Freq: Three times a day (TID) | INTRAMUSCULAR | Status: DC
  Administered 2014-12-13 – 2014-12-23 (×29): 5000 [IU] via SUBCUTANEOUS
  Filled 2014-12-13 (×30): qty 1

## 2014-12-13 MED ORDER — SODIUM CHLORIDE 0.9 % IV BOLUS (SEPSIS)
1000.0000 mL | Freq: Once | INTRAVENOUS | Status: AC
  Administered 2014-12-13: 1000 mL via INTRAVENOUS

## 2014-12-13 MED ORDER — SODIUM CHLORIDE 0.9 % IV SOLN: INTRAVENOUS | Status: DC

## 2014-12-13 MED ORDER — DEXTROSE 50 % IV SOLN
25.0000 mL | Freq: Once | INTRAVENOUS | Status: AC
  Administered 2014-12-13: 25 mL via INTRAVENOUS
  Filled 2014-12-13: qty 50

## 2014-12-13 MED ORDER — ACETAMINOPHEN 325 MG PO TABS
650.0000 mg | ORAL_TABLET | Freq: Four times a day (QID) | ORAL | Status: DC | PRN
Start: 2014-12-13 — End: 2014-12-23

## 2014-12-13 MED ORDER — MORPHINE SULFATE (PF) 2 MG/ML IV SOLN
INTRAVENOUS | Status: AC
  Administered 2014-12-13: 2 mg via INTRAMUSCULAR
  Filled 2014-12-13: qty 1

## 2014-12-13 MED ORDER — SODIUM CHLORIDE 0.9 % IJ SOLN
3.0000 mL | Freq: Two times a day (BID) | INTRAMUSCULAR | Status: DC
  Administered 2014-12-13 – 2014-12-23 (×19): 3 mL via INTRAVENOUS

## 2014-12-13 MED ORDER — INSULIN REGULAR HUMAN 100 UNIT/ML IJ SOLN: 5.0000 [IU] | Freq: Once | INTRAMUSCULAR | Status: DC

## 2014-12-13 MED ORDER — INSULIN ASPART 100 UNIT/ML ~~LOC~~ SOLN
SUBCUTANEOUS | Status: AC
  Administered 2014-12-13: 5 [IU]
  Filled 2014-12-13: qty 5

## 2014-12-13 NOTE — ED Notes (Signed)
Patient transported to Ultrasound 

## 2014-12-13 NOTE — ED Notes (Signed)
Patient transported to X-ray 

## 2014-12-13 NOTE — Progress Notes (Signed)
Pre-hd tx 

## 2014-12-13 NOTE — ED Notes (Signed)
Critical lab ph 7.03. MD aware.

## 2014-12-13 NOTE — ED Notes (Signed)
Pt 88% on room air. Pt placed on 2 L [place]. MD aware.

## 2014-12-13 NOTE — H&P (Signed)
[facility] [place] [facility]   PATIENT NAME: [name] FossaKendra Haas    MR#:  [id number]  DATE OF BIRTH:  1965-05-22  DATE OF ADMISSION:  12/13/2014  PRIMARY CARE PHYSICIAN: Reesa ChewGRIFFITH, [name], MD   REQUESTING/REFERRING PHYSICIAN:   CHIEF COMPLAINT:   Chief Complaint  Patient presents with  . Altered Mental Status    HISTORY OF PRESENT ILLNESS: [name] Haas  is a 49 y.o. female with a known history of bipolar disorder, chronic pain, restless leg syndrome, irritable bowel syndrome who according to her father was acting normally yesterday morning. Last night she had some nausea but was awake but when this morning he noticed she was very confused and was not herself. Patient brought to the emergency room noted to have a creatinine this very elevated, she has a creatinine level of 7.53 her recent creatinine on 12/06/2013 was 1.16. She also was noted to have urinary tract infection. Currently she is confused and unable to provide me with any review of systems I discussed with the father and he was able to provide me with the above history. PAST MEDICAL HISTORY:   Past Medical History  Diagnosis Date  . Other chronic pain   . Restless legs syndrome (RLS)   . Disorder of bone and cartilage, unspecified   . Unspecified essential hypertension   . Esophageal reflux   . Personal history of malignant neoplasm of cervix uteri   . Irritable bowel syndrome   . Contact dermatitis and other eczema, due to unspecified cause   . Lumbago   . Migraine, unspecified, without mention of intractable migraine without mention of status migrainosus   . Unspecified schizophrenia, unspecified condition   . Personal history of colonic polyps   . Symptomatic menopausal or female climacteric states   . Other and unspecified hyperlipidemia   . Pain in joint, site unspecified   . Hypertonicity of bladder   . Pain in joint, forearm   . History of renal failure and respiratory acidosis  (05/24/2014) 11/20/2014    PAST SURGICAL HISTORY:  Past Surgical History  Procedure Laterality Date  . Breast lumpectomy      left multiple benign  . Hand surgery  1988    left tendon transfer  . Vaginal hysterectomy  2005  . Bilateral oophorectomy  2005  . Cervical cone biopsy  2005  . Shoulder arthroscopy  2007    right  . Retinal detachment surgery  2010    right, repair    SOCIAL HISTORY:  Social History  Substance Use Topics  . Smoking status: Former Smoker    Quit date: 06/06/2012  . Smokeless tobacco: Never Used  . Alcohol Use: No    FAMILY HISTORY:  Family History  Problem Relation Age of Onset  . Skin cancer Father     carcinoma, basal cell  . Melanoma Father   . Anemia Mother   . Drug abuse Brother   . Breast cancer Paternal Grandmother   . Diabetes type II Paternal Grandfather   . Other Father     hay fever  . Other Mother     epilepsy    DRUG ALLERGIES:  Allergies  Allergen Reactions  . Alcohol Swelling    overall swelling  . Bextra [Valdecoxib]   . Celebrex [Celecoxib]   . Penicillins     Angioedema & rash  . Sulfa Antibiotics   . Sulfacetamide Sodium   . Vioxx [Rofecoxib]   . Metformin Rash    Sores,  ulcers    REVIEW OF SYSTEMS:   Unable to provide due to mental status    MEDICATIONS AT HOME:  Prior to Admission medications   Medication Sig Start Date End Date Taking? Authorizing Provider  BRISDELLE 7.5 MG CAPS Take 1 capsule by mouth at bedtime. 09/17/14  Yes Historical Provider, MD  diclofenac (VOLTAREN) 75 MG EC tablet Take 1 tablet by mouth 2 (two) times daily. 12/07/14  Yes Historical Provider, MD  dicyclomine (BENTYL) 20 MG tablet Take 20 mg by mouth daily.  04/12/14  Yes Historical Provider, MD  escitalopram (LEXAPRO) 10 MG tablet Take 1 tablet by mouth daily. 12/08/14  Yes Historical Provider, MD  morphine (MS CONTIN) 15 MG 12 hr tablet Take 1 tablet (15 mg total) by mouth every 12 (twelve) hours. 11/19/14  Yes Delano Metz, MD  MYRBETRIQ 50 MG TB24 tablet 50 mg daily.  06/01/14  Yes Historical Provider, MD  Oxycodone HCl 10 MG TABS Take 1 tablet (10 mg total) by mouth every 8 (eight) hours as needed. 11/19/14  Yes Delano Metz, MD  tiZANidine (ZANAFLEX) 4 MG tablet Take 1 tablet (4 mg total) by mouth every 8 (eight) hours as needed. 11/19/14  Yes Delano Metz, MD  QUEtiapine (SEROQUEL) 300 MG tablet Take 2 tablets (600 mg total) by mouth at bedtime. 06/22/14   Audery Amel, MD      PHYSICAL EXAMINATION:   VITAL SIGNS: Blood pressure 148/72, pulse 92, temperature 98.2 F (36.8 C), temperature source Axillary, resp. rate 15, height  (1.676 m), weight 99.791 kg (220 lb), SpO2 92 %.  GENERAL:  49 y.o.-year-old patient lying in the bed critically ill-appearing EYES: Pupils equal, round, reactive to light and accommodation. No scleral icterus. Extraocular muscles intact.  HEENT: Head atraumatic, normocephalic. Oropharynx and nasopharynx clear.  NECK:  Supple, no jugular venous distention. No thyroid enlargement, no tenderness.  LUNGS: Normal breath sounds bilaterally, no wheezing, rales,rhonchi or crepitation. No use of accessory muscles of respiration.  CARDIOVASCULAR: S1, S2 normal. No murmurs, rubs, or gallops.  ABDOMEN: Soft, nontender, nondistended. Bowel sounds present. No organomegaly or mass.  EXTREMITIES: No pedal edema, cyanosis, or clubbing.  NEUROLOGIC: Very confused. When all extremities spontaneously PSYCHIATRIC: Confused SKIN: No obvious rash, lesion, or ulcer.   LABORATORY PANEL:   CBC  Recent Labs Lab 12/13/14 0752  WBC 8.8  HGB 8.7*  HCT 28.2*  PLT 284  MCV 89.5  MCH 27.6  MCHC 30.8*  RDW 17.6*   ------------------------------------------------------------------------------------------------------------------  Chemistries   Recent Labs Lab 12/13/14 0752  NA 136  K 6.6*  CL 104  CO2 13*  GLUCOSE 103*  BUN 63*  CREATININE 7.53*  CALCIUM 8.3*  AST  32  ALT 16  ALKPHOS 105  BILITOT 0.9   ------------------------------------------------------------------------------------------------------------------ estimated creatinine clearance is 10.5 mL/min (by C-G formula based on Cr of 7.53). ------------------------------------------------------------------------------------------------------------------ No results for input(s): TSH, T4TOTAL, T3FREE, THYROIDAB in the last 72 hours.  Invalid input(s): FREET3   Coagulation profile No results for input(s): INR, PROTIME in the last 168 hours. ------------------------------------------------------------------------------------------------------------------- No results for input(s): DDIMER in the last 72 hours. -------------------------------------------------------------------------------------------------------------------  Cardiac Enzymes No results for input(s): CKMB, TROPONINI, MYOGLOBIN in the last 168 hours.  Invalid input(s): CK ------------------------------------------------------------------------------------------------------------------ Invalid input(s): POCBNP  ---------------------------------------------------------------------------------------------------------------  Urinalysis    Component Value Date/Time   COLORURINE AMBER* 12/13/2014 0830   COLORURINE Yellow 10/14/2013 1505   APPEARANCEUR CLOUDY* 12/13/2014 0830   APPEARANCEUR Cloudy 10/14/2013 1505   LABSPEC 1.020 12/13/2014 0830  LABSPEC 1.004 10/14/2013 1505   PHURINE 5.0 12/13/2014 0830   PHURINE 7.0 10/14/2013 1505   GLUCOSEU NEGATIVE 12/13/2014 0830   GLUCOSEU Negative 10/14/2013 1505   HGBUR NEGATIVE 12/13/2014 0830   HGBUR 2+ 10/14/2013 1505   BILIRUBINUR NEGATIVE 12/13/2014 0830   BILIRUBINUR Negative 10/14/2013 1505   KETONESUR NEGATIVE 12/13/2014 0830   KETONESUR Negative 10/14/2013 1505   PROTEINUR 30* 12/13/2014 0830   PROTEINUR 30 mg/dL 16/10/9602 5409   NITRITE NEGATIVE 12/13/2014 0830    NITRITE Negative 10/14/2013 1505   LEUKOCYTESUR 2+* 12/13/2014 0830   LEUKOCYTESUR 3+ 10/14/2013 1505     RADIOLOGY: Dg Chest 2 View  12/13/2014  CLINICAL DATA:  Altered mental status, pale, diaphoretic. EXAM: CHEST  2 VIEW COMPARISON:  Chest x-ray of October 09, 2013 FINDINGS: The lungs are hypoinflated. There is crowding of the pulmonary vascularity. The cardiac silhouette is enlarged. The pulmonary vascularity is indistinct. There is no pleural effusion. 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 IMPRESSION: Bilateral pulmonary hypoinflation. Stable enlargement cardiac silhouette. No definite pulmonary edema or pneumonia allowing for vascular crowding. Electronically Signed   By: David  [place] M.D.   On: 12/13/2014 09:17   Ct Head Wo Contrast  12/13/2014  CLINICAL DATA:  Altered mental status. EXAM: CT HEAD WITHOUT CONTRAST TECHNIQUE: Contiguous axial images were obtained from the base of the skull through the vertex without intravenous contrast. COMPARISON:  10/08/2013 FINDINGS: Skull and Sinuses:Negative for fracture or destructive process. The visualized mastoids, middle ears, and imaged paranasal sinuses are clear. Orbits: Scleral band on the right.  No acute finding. Brain: No evidence of acute infarction, hemorrhage, hydrocephalus, or mass lesion/mass effect. IMPRESSION: Negative head CT. Electronically Signed   By: Marnee Spring M.D.   On: 12/13/2014 08:33    EKG: Orders placed or performed during the hospital encounter of 12/13/14  . ED EKG  . ED EKG  . EKG 12-Lead  . EKG 12-Lead    IMPRESSION AND PLAN: Patient is a 49 year old white female brought in by family due to confusion 1. Acute encephalopathy: Could be related to urinary tract infection and acute renal failure. I have discussed the case with the father he does not seem to think that she is taken any medications. He manages her medications and all the medications tablets are as they should be. Due to severity of her  encephalopathy patient at high risk of respiratory failure, not monitor her in the stepdown unit.  2. Acute renal failure suspect due to ATN: IV fluids, Foley, renal ultrasound when stable, I have discussed the case with Dr. Richardson Dopp rule of nephrology who will see the patient area  3. Urinary tract infection: At this time I'll place the patient on Invanz  4. Hyperkalemia give her a dose of calcium gluconate, dextrose and insulin. I am not certain if she'll be able to take oral Kayexalate.   5. Bipolar disorder hold all her psychiatric medications   Case discussed with her father who is well aware of patient's critical illness    CODE STATUS: Full    TOTAL TIME TAKING CARE OF THIS PATIENT:  critical care time  Auburn Bilberry M.D on 12/13/2014 at 10:26 AM  Between 7am to 6pm - Pager - [phone]  After 6pm go to [email or web address] - password [email or web address] [facility]  [facility]  Office  [phone]  CC: Primary care physician; Reesa Chew, MD

## 2014-12-13 NOTE — Progress Notes (Signed)
Dr Wynelle LinkKolluru aware of patients potassium level.

## 2014-12-13 NOTE — ED Provider Notes (Addendum)
[facility] Emergency Department Provider Note  ____________________________________________  Time seen: Approximately 7:54 AM  I have reviewed the triage vital signs and the nursing notes.   HISTORY  Chief Complaint Altered Mental Status  History is limited due to patient altered mental status.   HPI [name] Haas is a 49 y.o. female with a history of HTN and schizophrenia presenting with altered mental status. EMS reports that this morning her dad found her with decreased responsiveness. EMS noted stable vital signs and a blood sugar in the 120s.  On arrival to the emergency department, the patient is alert and able to state her name but does not otherwise answer any questions so further history is limited.   Past Medical History  Diagnosis Date  . Other chronic pain   . Restless legs syndrome (RLS)   . Disorder of bone and cartilage, unspecified   . Unspecified essential hypertension   . Esophageal reflux   . Personal history of malignant neoplasm of cervix uteri   . Irritable bowel syndrome   . Contact dermatitis and other eczema, due to unspecified cause   . Lumbago   . Migraine, unspecified, without mention of intractable migraine without mention of status migrainosus   . Unspecified schizophrenia, unspecified condition   . Personal history of colonic polyps   . Symptomatic menopausal or female climacteric states   . Other and unspecified hyperlipidemia   . Pain in joint, site unspecified   . Hypertonicity of bladder   . Pain in joint, forearm   . History of renal failure and respiratory acidosis (05/24/2014) 11/20/2014    Patient Active Problem List   Diagnosis Date Noted  . Chronic left shoulder pain 11/20/2014  . Arthropathy of left shoulder 11/20/2014  . Chronic low back pain 11/20/2014  . History of renal failure and respiratory acidosis (05/24/2014) 11/20/2014  . Chronic pain syndrome 11/20/2014  . Long term current use of opiate  analgesic 11/19/2014  . Long term prescription opiate use 11/19/2014  . Opiate use 11/19/2014  . Opiate dependence (HCC) 11/19/2014  . Encounter for therapeutic drug level monitoring 11/19/2014  . Lumbar radiculopathy, acute (Right L5 Radiculopathy. "Drop foot") 11/19/2014  . Myofascial pain 11/19/2014  . Neurogenic pain 11/19/2014  . Foot drop, right 11/19/2014  . Chronic pain of right lower extremity 11/19/2014  . Encounter for surgical follow-up care 11/08/2014  . Absolute anemia 07/06/2014  . Injury of kidney 07/05/2014  . Fracture of shaft of tibia and fibula, open 07/05/2014  . Paranoid schizophrenia (HCC) 06/14/2014  . Back ache 06/14/2014  . Arm fracture 06/14/2014  . Depression, major, recurrent, moderate (HCC) 06/14/2014  . Aggrieved 06/14/2014  . Major depressive disorder, recurrent episode, severe, with psychotic behavior (HCC) 06/14/2014  . Essential hypertension 06/07/2014  . Pain in limb 06/07/2014  . HLD (hyperlipidemia) 06/07/2014  . Fracture of bone adjacent to prosthesis (HCC) 02/13/2014  . Extreme obesity (HCC) 01/23/2014  . Morbid obesity (HCC) 01/23/2014  . Type 2 diabetes mellitus with hyperglycemia (HCC) 01/23/2014  . Chronic pain 11/20/2013  . BP (high blood pressure) 11/20/2013  . Headache, migraine 11/20/2013  . 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 Affective bipolar disorder (HCC) 03/15/2012  . Acid reflux 03/15/2012  . Adiposity 03/15/2012  . Compulsive tobacco user syndrome 03/15/2012  . Bipolar affective disorder (HCC) 03/15/2012  . Incomplete bladder emptying 09/22/2011  . Urge incontinence of urine 08/25/2011    Past Surgical History  Procedure Laterality Date  . Breast lumpectomy  left multiple benign  . Hand surgery  1988    left tendon transfer  . Vaginal hysterectomy  2005  . Bilateral oophorectomy  2005  . Cervical cone biopsy  2005  . Shoulder arthroscopy  2007    right  . Retinal detachment surgery  2010    right, repair     Current Outpatient Rx  Name  Route  Sig  Dispense  Refill  . BRISDELLE 7.5 MG CAPS   Oral   Take 1 capsule by mouth at bedtime.      3     Dispense as written.   . diclofenac (VOLTAREN) 75 MG EC tablet   Oral   Take 1 tablet by mouth 2 (two) times daily.      2   . dicyclomine (BENTYL) 20 MG tablet   Oral   Take 20 mg by mouth daily.       0   . escitalopram (LEXAPRO) 10 MG tablet   Oral   Take 1 tablet by mouth daily.      3   . morphine (MS CONTIN) 15 MG 12 hr tablet   Oral   Take 1 tablet (15 mg total) by mouth every 12 (twelve) hours.   60 tablet   0     Do not place this medication, or any other prescri ...   . MYRBETRIQ 50 MG TB24 tablet      50 mg daily.       11     Dispense as written.   . Oxycodone HCl 10 MG TABS   Oral   Take 1 tablet (10 mg total) by mouth every 8 (eight) hours as needed.   90 tablet   0     Do not place this medication, or any other prescri ...   . tiZANidine (ZANAFLEX) 4 MG tablet   Oral   Take 1 tablet (4 mg total) by mouth every 8 (eight) hours as needed.   90 tablet   0     Do not place this medication, or any other prescri ...   . QUEtiapine (SEROQUEL) 300 MG tablet   Oral   Take 2 tablets (600 mg total) by mouth at bedtime.   60 tablet   2     Allergies Alcohol; Bextra; Celebrex; Penicillins; Sulfa antibiotics; Sulfacetamide sodium; Vioxx; and Metformin  Family History  Problem Relation Age of Onset  . Skin cancer Father     carcinoma, basal cell  . Melanoma Father   . Anemia Mother   . Drug abuse Brother   . Breast cancer Paternal Grandmother   . Diabetes type II Paternal Grandfather   . Other Father     hay fever  . Other Mother     epilepsy    Social History Social History  Substance Use Topics  . Smoking status: Former Smoker    Quit date: 06/06/2012  . Smokeless tobacco: Never Used  . Alcohol Use: No    Review of Systems Unable to obtain due to patient mental  status  ____________________________________________   PHYSICAL EXAM:  VITAL SIGNS: ED Triage Vitals  Enc Vitals Group     BP --      Pulse Rate 12/13/14 0751 103     Resp --      Temp 12/13/14 0751 98.2 F (36.8 C)     Temp Source 12/13/14 0751 Axillary     SpO2 --      Weight --  Height --      Head Cir --      Peak Flow --      Pain Score --      Pain Loc --      Pain Edu? --      Excl. in GC? --     Constitutional: Patient is alert and oriented to first name only. She is pale and diaphoretic, she is picking at the sheets and the side of the bed. She does verbally respond when I speak to her on her right side but appears to have a neglect when I speak to her from the left side. Eyes: Conjunctivae are normal.  EOMI. PERRLA Head: Atraumatic. No raccoon eyes or Battle sign. Nose: No congestion/rhinnorhea. Mouth/Throat: Mucous membranes are dry.  Neck: No stridor.  Supple.  Full range of motion without pain. No meningismus. Cardiovascular: Normal rate, regular rhythm. No murmurs, rubs or gallops.  Respiratory: Normal respiratory effort.  No retractions. Lungs CTAB.  No wheezes, rales or ronchi. Gastrointestinal: Obese, Soft and nontender. No distention. No peritoneal signs. Musculoskeletal: No LE edema.  Neurologic:  Patient is alert and oriented only to first name. She occasionally has some garbled speech. Face is symmetric. Patient has some neglect on her left side as described above. Moves all extremities well but is unable to comply with a full neurologic exam. Skin:  Skin is diaphoretic and intact. No rash noted. Plus pallor no jaundice. Psychiatric: Unable to assess due to patient condition. ____________________________________________   LABS (all labs ordered are listed, but only abnormal results are displayed)  Labs Reviewed  CBC - Abnormal; Notable for the following:    RBC 3.16 (*)    Hemoglobin 8.7 (*)    HCT 28.2 (*)    MCHC 30.8 (*)    RDW 17.6 (*)     All other components within normal limits  COMPREHENSIVE METABOLIC PANEL - Abnormal; Notable for the following:    Potassium 6.6 (*)    CO2 13 (*)    Glucose, Bld 103 (*)    BUN 63 (*)    Creatinine, Ser 7.53 (*)    Calcium 8.3 (*)    Albumin 3.0 (*)    GFR calc non Af Amer 6 (*)    GFR calc Af Amer 6 (*)    Anion gap 19 (*)    All other components within normal limits  AMMONIA - Abnormal; Notable for the following:    Ammonia 36 (*)    All other components within normal limits  BLOOD GAS, VENOUS - Abnormal; Notable for the following:    pH, Ven 7.03 (*)    Bicarbonate 13.7 (*)    Acid-base deficit 17.1 (*)    All other components within normal limits  URINALYSIS COMPLETEWITH MICROSCOPIC ([facility] ONLY) - Abnormal; Notable for the following:    Color, Urine AMBER (*)    APPearance CLOUDY (*)    Protein, ur 30 (*)    Leukocytes, UA 2+ (*)    Bacteria, UA MANY (*)    Squamous Epithelial / LPF 0-5 (*)    All other components within normal limits  GLUCOSE, CAPILLARY  URINE DRUG SCREEN, QUALITATIVE ([facility] ONLY)  ETHANOL   ____________________________________________  EKG  ED ECG REPORT I, Rockne MenghiniNorman, Anne-Caroline, the attending physician, personally viewed and interpreted this ECG.   Date: 12/13/2014  EKG Time: 752  Rate: 102  Rhythm: Sinus tachycardia; incomplete right bundle-branch block  Axis: Leftward  Intervals:none  ST&T Change: Nonspecific T-wave inversions  in V1 and V2. No ST elevation.  ____________________________________________  RADIOLOGY  Dg Chest 2 View  12/13/2014  CLINICAL DATA:  Altered mental status, pale, diaphoretic. EXAM: CHEST  2 VIEW COMPARISON:  Chest x-ray of October 09, 2013 FINDINGS: The lungs are hypoinflated. There is crowding of the pulmonary vascularity. The cardiac silhouette is enlarged. The pulmonary vascularity is indistinct. There is no pleural effusion. 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 IMPRESSION: Bilateral pulmonary hypoinflation.  Stable enlargement cardiac silhouette. No definite pulmonary edema or pneumonia allowing for vascular crowding. Electronically Signed   By: David  [place] M.D.   On: 12/13/2014 09:17   Ct Head Wo Contrast  12/13/2014  CLINICAL DATA:  Altered mental status. EXAM: CT HEAD WITHOUT CONTRAST TECHNIQUE: Contiguous axial images were obtained from the base of the skull through the vertex without intravenous contrast. COMPARISON:  10/08/2013 FINDINGS: Skull and Sinuses:Negative for fracture or destructive process. The visualized mastoids, middle ears, and imaged paranasal sinuses are clear. Orbits: Scleral band on the right.  No acute finding. Brain: No evidence of acute infarction, hemorrhage, hydrocephalus, or mass lesion/mass effect. IMPRESSION: Negative head CT. Electronically Signed   By: Marnee Spring M.D.   On: 12/13/2014 08:33    ____________________________________________   PROCEDURES  Procedure(s) performed: None  Critical Care performed: yes ____________________________________________   INITIAL IMPRESSION / ASSESSMENT AND PLAN / ED COURSE  Pertinent labs & imaging results that were available during my care of the patient were reviewed by me and considered in my medical decision making (see chart for details).  49 y.o. female with a history of HTN and schizophrenia presenting with altered mental status. The patient does have stable vital signs except for some hypoxia to 88% which improved to the high 90s with 2 L nasal cannula. Her presentation could be a result of medication side effect, acute neurologic condition including stroke, or underlying psychiatric illness. We will also check for infection although there is no known history of symptoms and this is less likely. Anticipate admission.  ----------------------------------------- 9:23 AM on 12/13/2014 -----------------------------------------  The patient's labs demonstrate an acute renal insufficiency with a creatinine of greater  than 7. She has associated hyperkalemia but no hyperkalemic changes on her EKG. In addition, she is acidemic and has a urinary tract infection. I will treat her with IV fluids, antibiotics, and she will require admission to the hospital.  The patient also has some hypoxia which does respond to 2 L nasal cannula. She continues to be hemodynamically stable at this time. I have talked with her father about the results of her testing and he understands and is in agreement with the plan.  CRITICAL CARE Performed by: Rockne Menghini   Total critical care time: 35 minutes  Critical care time was exclusive of separately billable procedures and treating other patients.  Critical care was necessary to treat or prevent imminent or life-threatening deterioration.  Critical care was time spent personally by me on the following activities: development of treatment plan with patient and/or surrogate as well as nursing, discussions with consultants, evaluation of patient's response to treatment, examination of patient, obtaining history from patient or surrogate, ordering and performing treatments and interventions, ordering and review of laboratory studies, ordering and review of radiographic studies, pulse oximetry and re-evaluation of patient's condition.  ____________________________________________  FINAL CLINICAL IMPRESSION(S) / ED DIAGNOSES  Final diagnoses:  Renal failure  Hyperkalemia  UTI (lower urinary tract infection)  Altered mental status, unspecified altered mental status type  Acidemia  NEW MEDICATIONS STARTED DURING THIS VISIT:  New Prescriptions   No medications on file     Rockne Menghini, MD 12/13/14 1610  Rockne Menghini, MD 12/13/14 [phone]

## 2014-12-13 NOTE — Progress Notes (Signed)
eLink Physician-Brief Progress Note Patient Name: Lita MainsKendra A Keller DOB: 22-Nov-1965 MRN: [id number]   Date of Service  12/13/2014  HPI/Events of Note  Admit with ams/atn hemodynamics ok after 1 liter fluid bolus  eICU Interventions  No ccm interventions needed     Intervention Category Major Interventions: Change in mental status - evaluation and management  Sandrea HughsMichael Jailynn Lavalais 12/13/2014, 5:12 PM

## 2014-12-13 NOTE — Procedures (Signed)
  Procedure Note: VasCath Placement - R Femoral Vein Lita MainsKendra A Crumpacker , [id number] , [id number]  Indications: Hemodynamic monitoring / Intravenous access  Benefits, risks (including bleeding, infection,  Injury, etc.), and alternatives explained to father who voiced understanding.  Questions were sought and answered.   father agreed to proceed with the procedure.  Consent is signed and on chart. A time-out was completed verifying correct patient, procedure and site.  A 3 lumen HD catheter available at the time of procedure.  The patient was placed in a dependent position appropriate for central line placement based on the vein to be cannulated.   The patient's RIGHT Femoral Vein was prepped and draped in a sterile fashion.  1% Lidocaine WASused to anesthetize the surrounding skin area.   A 3 lumen HD catheter was introduced into the RIGHT Femoral Vein using Seldinger technique, visualized under ultrasound.  The catheter was threaded smoothly over the guide wire and appropriate blood return was obtained.  Each lumen of the catheter was evacuated of air and flushed with sterile saline.  The catheter was then sutured in place to the skin and a sterile dressing applied.  Perfusion to the extremity distal to the point of catheter insertion was checked and found to be adequate.    The patient tolerated the procedure well and there were no complications.  Intra procedure meds Versed 2mg  Morphine 2mg    Stephanie AcreVishal Tanazia Achee, MD [facility] Pager [phone] (please enter 7-digits) On Call Pager - [phone] (please enter 7-digits)

## 2014-12-13 NOTE — Progress Notes (Signed)
Patient accepted from ED, confused and climbing out of bed with labored breathing.  Unable to orient patient.  Nurse concerned and called Dr. Dema SeverinMungal.  Telephone order obtained for ABG STAT.

## 2014-12-13 NOTE — Progress Notes (Signed)
Patient seen and examined on hemodialysis. Placed on 1 K bath for 1 hour with no ultrafiltration. Patient developed hypotension during treatment. It was then decided to stop hemodialysis treatment.  Discussed case with E-Link physician. Will order norepinephrine gtt for goal MAP of >65. Administer through third line of trialysis catheter.      [name] DowdyKOLLURU, [name] Hagner, MD [facility]

## 2014-12-13 NOTE — Consult Note (Signed)
[facility]  CONSULT NOTE    Date: 12/13/2014                  Patient Name:  [name] Haas  MRN: [id number]  DOB: 1965/08/15  Age / Sex: 49 y.o., female         PCP: Junius Finner, MD                 Service Requesting Consult: Dr. Dustin Flock                 Reason for Consult: Acute renal failure with metabolic acidosis and hyperkalemia            History of Present Illness: [name] is a 49 y.o. white female with chronic pain syndrome, schizophrenia, restless leg syndrome, irritable bowel syndrome, who was admitted to [facility] on 12/13/2014 for Hyperkalemia [E87.5] Acidemia [E87.2] Renal failure [N19] ARF (acute renal failure) (HCC) [N17.9] UTI (lower urinary tract infection) [N39.0] Hypoxia [R09.02] Altered mental status, unspecified altered mental status type [R41.82]   History taken from father who lives with patient and administers her medications. He denies an overdose or suicidal ideations. She was in normal state of health yesterday morning. She was having nausea last night. Then this morning, she was confused. He brought her to the ED where it was found that she has a potassium of 6.6 and creatinine of 7.53. Creatinine on 12/2 was 1.16 with eGFR of 53. Aslo with severe anion gap metabolic acidosis. Lactic acid level pending.   Denies any current use of nonsteroidal anti-inflammatory agents.   Found to have pyuria in the ED. Given cipro IV and started on meropenem.   For hyperkalemia: patient was given bolus 1 litre normal saline, calcium gluconate, dextrose, insulin.    Medications: Outpatient medications: Prescriptions prior to admission  Medication Sig Dispense Refill Last Dose  . BRISDELLE 7.5 MG CAPS Take 1 capsule by mouth at bedtime.  3 Past Week at Unknown time  . diclofenac (VOLTAREN) 75 MG EC tablet Take 1 tablet by mouth 2 (two) times daily.  2 Past Week at Unknown time  . dicyclomine (BENTYL) 20 MG tablet Take 20 mg  by mouth daily.   0 Past Week at Unknown time  . escitalopram (LEXAPRO) 10 MG tablet Take 1 tablet by mouth daily.  3 Past Week at Unknown time  . morphine (MS CONTIN) 15 MG 12 hr tablet Take 1 tablet (15 mg total) by mouth every 12 (twelve) hours. 60 tablet 0 Past Week at Unknown time  . MYRBETRIQ 50 MG TB24 tablet 50 mg daily.   11 Past Week at Unknown time  . Oxycodone HCl 10 MG TABS Take 1 tablet (10 mg total) by mouth every 8 (eight) hours as needed. 90 tablet 0 Past Week at Unknown time  . tiZANidine (ZANAFLEX) 4 MG tablet Take 1 tablet (4 mg total) by mouth every 8 (eight) hours as needed. 90 tablet 0 Past Week at Unknown time  . QUEtiapine (SEROQUEL) 300 MG tablet Take 2 tablets (600 mg total) by mouth at bedtime. 60 tablet 2 Taking    Current medications: Current Facility-Administered Medications  Medication Dose Route Frequency Provider Last Rate Last Dose  . acetaminophen (TYLENOL) tablet 650 mg  650 mg Oral Q6H PRN Dustin Flock, MD       Or  . acetaminophen (TYLENOL) suppository 650 mg  650 mg Rectal Q6H PRN Dustin Flock, MD      .  heparin injection 5,000 Units  5,000 Units Subcutaneous 3 times per day Dustin Flock, MD      . insulin aspart (novoLOG) injection 5 Units  5 Units Intravenous Once Dustin Flock, MD      . meropenem (MERREM) 500 mg in sodium chloride 0.9 % 50 mL IVPB  500 mg Intravenous Q12H Dustin Flock, MD      . sodium bicarbonate 1 mEq/mL injection           . sodium bicarbonate 150 mEq in dextrose 5 % 1,000 mL infusion   Intravenous Continuous Rockie Vawter, MD      . sodium chloride 0.9 % injection 3 mL  3 mL Intravenous Q12H Dustin Flock, MD          Allergies: Allergies  Allergen Reactions  . Alcohol Swelling    overall swelling  . Bextra [Valdecoxib]   . Celebrex [Celecoxib]   . Penicillins     Angioedema & rash  . Sulfa Antibiotics   . Sulfacetamide Sodium   . Vioxx [Rofecoxib]   . Metformin Rash    Sores, ulcers      Past Medical  History: Past Medical History  Diagnosis Date  . Other chronic pain   . Restless legs syndrome (RLS)   . Disorder of bone and cartilage, unspecified   . Unspecified essential hypertension   . Esophageal reflux   . Personal history of malignant neoplasm of cervix uteri   . Irritable bowel syndrome   . Contact dermatitis and other eczema, due to unspecified cause   . Lumbago   . Migraine, unspecified, without mention of intractable migraine without mention of status migrainosus   . Unspecified schizophrenia, unspecified condition   . Personal history of colonic polyps   . Symptomatic menopausal or female climacteric states   . Other and unspecified hyperlipidemia   . Pain in joint, site unspecified   . Hypertonicity of bladder   . Pain in joint, forearm   . History of renal failure and respiratory acidosis (05/24/2014) 11/20/2014     Past Surgical History: Past Surgical History  Procedure Laterality Date  . Breast lumpectomy      left multiple benign  . Hand surgery  1988    left tendon transfer  . Vaginal hysterectomy  2005  . Bilateral oophorectomy  2005  . Cervical cone biopsy  2005  . Shoulder arthroscopy  2007    right  . Retinal detachment surgery  2010    right, repair     Family History: Family History  Problem Relation Age of Onset  . Skin cancer Father     carcinoma, basal cell  . Melanoma Father   . Anemia Mother   . Drug abuse Brother   . Breast cancer Paternal Grandmother   . Diabetes type II Paternal Grandfather   . Other Father     hay fever  . Other Mother     epilepsy     Social History: Social History   Social History  . Marital Status: Single    Spouse Name: N/A  . Number of Children: N/A  . Years of Education: N/A   Occupational History  . Not on file.   Social History Main Topics  . Smoking status: Former Smoker    Quit date: 06/06/2012  . Smokeless tobacco: Never Used  . Alcohol Use: No  . Drug Use: No  . Sexual Activity:  No   Other Topics Concern  . Not on file   Social  History Narrative     Review of Systems: Review of Systems  Unable to perform ROS: critical illness    Vital Signs: Blood pressure 124/85, pulse 94, temperature 98.2 F (36.8 C), temperature source Axillary, resp. rate 22, height 5' 6"  (1.676 m), weight 99.791 kg (220 lb), SpO2 94 %.  Weight trends: Filed Weights   12/13/14 0751  Weight: 99.791 kg (220 lb)    Physical Exam: General: Critically ill  Head: Normocephalic, atraumatic. Moist oral mucosal membranes  Eyes: Anicteric, PERRL  Neck: Supple, trachea midline  Lungs:  Clear to auscultation  Heart: Regular rate and rhythm  Abdomen:  Soft, nontender, obese  Extremities:  no peripheral edema.  Neurologic: Nonfocal, moving all four extremities, not oriented  Skin: No lesions  GU Foley with dark urine     Lab results: Basic Metabolic Panel:  Recent Labs Lab 12/13/14 0752  NA 136  K 6.6*  CL 104  CO2 13*  GLUCOSE 103*  BUN 63*  CREATININE 7.53*  CALCIUM 8.3*    Liver Function Tests:  Recent Labs Lab 12/13/14 0752  AST 32  ALT 16  ALKPHOS 105  BILITOT 0.9  PROT 6.8  ALBUMIN 3.0*   No results for input(s): LIPASE, AMYLASE in the last 168 hours.  Recent Labs Lab 12/13/14 0830  AMMONIA 36*    CBC:  Recent Labs Lab 12/13/14 0752 12/13/14 1511  WBC 8.8 5.5  HGB 8.7* 7.8*  HCT 28.2* 25.1*  MCV 89.5 88.4  PLT 284 216    Cardiac Enzymes: No results for input(s): CKTOTAL, CKMB, CKMBINDEX, TROPONINI in the last 168 hours.  BNP: Invalid input(s): POCBNP  CBG:  Recent Labs Lab 12/13/14 0807  GLUCAP 97    Microbiology: Results for orders placed or performed in visit on 10/04/13  Culture, blood (single)     Status: None   Collection Time: 10/04/13  8:50 AM  Result Value Ref Range Status   Micro Text Report   Final       COMMENT                   NO GROWTH AEROBICALLY/ANAEROBICALLY IN 5 DAYS   ANTIBIOTIC                                                       Culture, blood (single)     Status: None   Collection Time: 10/04/13 10:06 AM  Result Value Ref Range Status   Micro Text Report   Final       ORGANISM 1                ENTEROBACTER AEROGENES   COMMENT                   IN AEROBIC BOTTLE ONLY   COMMENT                   -   GRAM STAIN                GRAM NEGATIVE COCCOBACILLI   ANTIBIOTIC                    ORG#1     CEFAZOLIN                     R  CEFOXITIN                     R         CEFTAZIDIME                   S         CEFTRIAXONE                   S         CIPROFLOXACIN                 S         GENTAMICIN                    S         IMIPENEM                      S         LEVOFLOXACIN                  S           Urine culture     Status: None   Collection Time: 10/04/13  3:51 PM  Result Value Ref Range Status   Micro Text Report   Final       SOURCE: CLEAN CATCH    COMMENT                   MIXED BACTERIAL ORGANISMS   COMMENT                   RESULTS SUGGESTIVE OF CONTAMINATION   ANTIBIOTIC                                                      Culture, blood (single)     Status: None   Collection Time: 10/09/13  9:51 AM  Result Value Ref Range Status   Micro Text Report   Final       SOURCE: right ac    ORGANISM 1                STREPTOCOCCUS VIRIDANS   COMMENT                   IN ANAEROBE BOTTLE ONLY   COMMENT                   POSSIBLE CONTAMINATION W/SKIN FLORA   GRAM STAIN                GRAM VARIABLE COCCOBACILLI   ANTIBIOTIC                                                      Culture, blood (single)     Status: None   Collection Time: 10/09/13  9:51 AM  Result Value Ref Range Status   Micro Text Report   Final       SOURCE: right hand    ORGANISM 1                COAGULASE NEGATIVE STAPHYLOCOCCUS  COMMENT                   IN AEROBIC BOTTLE ONLY   COMMENT                   POSSIBLE CONTAMINATION W/SKIN FLORA   GRAM STAIN                GRAM  POSITIVE COCCI IN CLUSTERS   ANTIBIOTIC                                                      Urine culture     Status: None   Collection Time: 10/09/13 10:20 AM  Result Value Ref Range Status   Micro Text Report   Final       SOURCE: INDWELLING CATHETER3    ORGANISM 1                3000 CFU/ML ESCHERICHIA COLI   ANTIBIOTIC                    ORG#1     AMPICILLIN                    R         CEFAZOLIN                     S         CEFOXITIN                     S         CEFTRIAXONE                   S         CIPROFLOXACIN                 R         ERTAPENEM                     S         GENTAMICIN                    R         IMIPENEM                      S         LEVOFLOXACIN                  R         NITROFURANTOIN                S         TRIMETHOPRIM/SULFAMETHOXAZOLE S           Culture, expectorated sputum-assessment     Status: None   Collection Time: 10/09/13 12:00 PM  Result Value Ref Range Status   Micro Text Report   Final       SOURCE: ETS    COMMENT                   APPEARS TO BE NORMAL FLORA AT 48 HOURS   GRAM STAIN  FAIR SPECIMEN-70-80% WBC   GRAM STAIN                RARE GRAM VARIABLE ROD   GRAM STAIN                MODERATE WHITE BLOOD CELLS   ANTIBIOTIC                                                      Culture, blood (single)     Status: None   Collection Time: 10/10/13  5:12 PM  Result Value Ref Range Status   Micro Text Report   Final       COMMENT                   NO GROWTH AEROBICALLY/ANAEROBICALLY IN 5 DAYS   ANTIBIOTIC                                                      Culture, blood (single)     Status: None   Collection Time: 10/10/13  5:12 PM  Result Value Ref Range Status   Micro Text Report   Final       COMMENT                   NO GROWTH AEROBICALLY/ANAEROBICALLY IN 5 DAYS   ANTIBIOTIC                                                        Coagulation Studies: No results for input(s): LABPROT, INR  in the last 72 hours.  Urinalysis:  Recent Labs  12/13/14 0830  COLORURINE AMBER*  LABSPEC 1.020  PHURINE 5.0  GLUCOSEU NEGATIVE  HGBUR NEGATIVE  BILIRUBINUR NEGATIVE  KETONESUR NEGATIVE  PROTEINUR 30*  NITRITE NEGATIVE  LEUKOCYTESUR 2+*      Imaging: Dg Chest 2 View  12/13/2014  CLINICAL DATA:  Altered mental status, pale, diaphoretic. EXAM: CHEST  2 VIEW COMPARISON:  Chest x-ray of October 09, 2013 FINDINGS: The lungs are hypoinflated. There is crowding of the pulmonary vascularity. The cardiac silhouette is enlarged. The pulmonary vascularity is indistinct. There is no pleural effusion. 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 IMPRESSION: Bilateral pulmonary hypoinflation. Stable enlargement cardiac silhouette. No definite pulmonary edema or pneumonia allowing for vascular crowding. Electronically Signed   By: David  Martinique M.D.   On: 12/13/2014 09:17   Ct Head Wo Contrast  12/13/2014  CLINICAL DATA:  Altered mental status. EXAM: CT HEAD WITHOUT CONTRAST TECHNIQUE: Contiguous axial images were obtained from the base of the skull through the vertex without intravenous contrast. COMPARISON:  10/08/2013 FINDINGS: Skull and Sinuses:Negative for fracture or destructive process. The visualized mastoids, middle ears, and imaged paranasal sinuses are clear. Orbits: Scleral band on the right.  No acute finding. Brain: No evidence of acute infarction, hemorrhage, hydrocephalus, or mass lesion/mass effect. IMPRESSION: Negative head CT. Electronically Signed   By: Monte Fantasia M.D.   On: 12/13/2014 08:33   US Renal  12/13/2014  CLINICAL DATA:  49 year old female with acute renal failure. Initial encounter. EXAM: RENAL / URINARY TRACT ULTRASOUND COMPLETE COMPARISON:  10/11/2013. FINDINGS: Right Kidney: Evaluation limited by patient's habitus and patient unable to cooperate. Atrophic lobular appearance of the right kidney without hydronephrosis. 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 If further delineation of the right kidney is clinically desired, CT imaging (can be performed without contrast) may be considered. Left Kidney: Length: 9.2 cm. Echogenicity within normal limits. No mass or hydronephrosis visualized. Bladder: Appears normal for degree of bladder distention. IMPRESSION: Exam limited.  Particularly limited evaluation of the right kidney. Right kidney appears atrophic and lobulated. Follow-up as noted above. No hydronephrosis detected. Electronically Signed   By: Genia Del M.D.   On: 12/13/2014 11:02      Assessment & Plan: Ms. OTHA RICKLES is a 49 y.o. white female with chronic pain syndrome, schizophrenia, restless leg syndrome, irritable bowel syndrome, history of pulmonary embolism who was admitted to [facility] on 12/13/2014   1. Acute renal failure on chronic kidney disease stage III:  2. Hyperkalemia 3. Metabolic acidosis 4. Metabolic encephalopathy 5. Anemia  Differential is wide. Sepsis seems to be the thought. Patient did not have any urinary infection symptoms prior. Afebrile and no leukocytosis.  With large anion gap metabolic acidosis: concern for ingestion of an agent. Will do work up for toxicology.  Currently hemodynamically stable.  - Start bicarb IV administration: 146m/hr - Check lactic acid, acetaminophen, salycylate, serum osm.  If no improvement in urine output, hyperkalemia, acidosis or urine output, will proceed with renal replacement therapy.     LOS: 0 Jariah Jarmon 12/8/20163:50 PM

## 2014-12-13 NOTE — Progress Notes (Signed)
ANTIBIOTIC CONSULT NOTE - INITIAL  Pharmacy Consult for meropenem Indication: UTI  Allergies  Allergen Reactions  . Alcohol Swelling    overall swelling  . Bextra [Valdecoxib]   . Celebrex [Celecoxib]   . Penicillins     Angioedema & rash  . Sulfa Antibiotics   . Sulfacetamide Sodium   . Vioxx [Rofecoxib]   . Metformin Rash    Sores, ulcers    Patient Measurements: Height: 5\' 6"  (167.6 cm) Weight: 220 lb (99.791 kg) IBW/kg (Calculated) : 59.3 Adjusted Body Weight:   Vital Signs: Temp: 98.2 F (36.8 C) (12/08 0751) Temp Source: Axillary (12/08 0751) BP: 124/85 mmHg (12/08 1436) Pulse Rate: 94 (12/08 1436) Intake/Output from previous day:   Intake/Output from this shift:    Labs:  Recent Labs  12/13/14 0752  WBC 8.8  HGB 8.7*  PLT 284  CREATININE 7.53*   Estimated Creatinine Clearance: 10.5 mL/min (by C-G formula based on Cr of 7.53). No results for input(s): VANCOTROUGH, VANCOPEAK, VANCORANDOM, GENTTROUGH, GENTPEAK, GENTRANDOM, TOBRATROUGH, TOBRAPEAK, TOBRARND, AMIKACINPEAK, AMIKACINTROU, AMIKACIN in the last 72 hours.   Microbiology: No results found for this or any previous visit (from the past 720 hour(s)).  Medical History: Past Medical History  Diagnosis Date  . Other chronic pain   . Restless legs syndrome (RLS)   . Disorder of bone and cartilage, unspecified   . Unspecified essential hypertension   . Esophageal reflux   . Personal history of malignant neoplasm of cervix uteri   . Irritable bowel syndrome   . Contact dermatitis and other eczema, due to unspecified cause   . Lumbago   . Migraine, unspecified, without mention of intractable migraine without mention of status migrainosus   . Unspecified schizophrenia, unspecified condition   . Personal history of colonic polyps   . Symptomatic menopausal or female climacteric states   . Other and unspecified hyperlipidemia   . Pain in joint, site unspecified   . Hypertonicity of bladder   .  Pain in joint, forearm   . History of renal failure and respiratory acidosis (05/24/2014) 11/20/2014    Medications:  Scheduled:  . insulin aspart  5 Units Intravenous Once   Assessment: Pt is a 49 year old female in acute renal failure with a possible UTI. Pt has a penicillin allergy (angioedema/rash). Pt ordered meropenem.  Goal of Therapy:  resolution of infection  Plan:  Follow up culture results Due to renal function, dose needs to be asjusted to meropenem 500mg  q 12 hours. Pharmacy to monitor changes in renal function requiring dose adjustments.  Teneil Shiller D [employer] 12/13/2014,2:38 PM

## 2014-12-13 NOTE — Progress Notes (Signed)
eLink Physician-Brief Progress Note Patient Name: [name] MainsKendra A Haas DOB: June 11, 1965 MRN: [id number]   Date of Service  12/13/2014  HPI/Events of Note  Requiring high dose levophed for low bp ? Accuracy of non -invasive  eICU Interventions  Place art line     Intervention Category Major Interventions: Shock - evaluation and management  Sandrea HughsMichael Wert 12/13/2014, 10:29 PM

## 2014-12-13 NOTE — Progress Notes (Signed)
HD tx start 

## 2014-12-13 NOTE — ED Notes (Signed)
Critical lab potassium 6.6. MD aware.

## 2014-12-13 NOTE — Consult Note (Signed)
PULMONARY / CRITICAL CARE MEDICINE   Name: [name] Haas MRN: [id number] DOB: 04-Apr-1965    ADMISSION DATE:  12/13/2014 CONSULTATION DATE:  12/13/14  REFERRING MD :  Dr. 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 History per father Father states patient lives with him, and he administers her medication, she follows with psychiatry (Dr. Delaney Meigs), for paranoid schizophrenia, she is on different medications which her father controls. He denies any overdosing, since he controls the medications and administers it to her. He denies any accidental ingestion of any alcohol, rubbing alcohol, access to antifreeze. She was brought to the ED after having acute mental status changes, since this morning, she was had a creatinine of 7.53 and apotassium was 6.6, on repeat labs worsening of creatinine and potassium, nephrology saw patient noted the patient had a severe anion gap metabolic acidosis, concerning for some type of toxic metabolic acidosis. Patient also found to have pyuria in the ED, she was given Cipro IV and started on meropenem, for  hyperkalemia she was given 1 bolus of normal saline 1 L, calcium gluconate, dextrose, insulin.    SIGNIFICANT EVENTS     PAST MEDICAL HISTORY    :  Past Medical History  Diagnosis Date  . Other chronic pain   . Restless legs syndrome (RLS)   . Disorder of bone and cartilage, unspecified   . Unspecified essential  hypertension   . Esophageal reflux   . Personal history of malignant neoplasm of cervix uteri   . Irritable bowel syndrome   . Contact dermatitis and other eczema, due to unspecified cause   . Lumbago   . Migraine, unspecified, without mention of intractable migraine without mention of status migrainosus   . Unspecified schizophrenia, unspecified condition   . Personal history of colonic polyps   . Symptomatic menopausal or female climacteric states   . Other and unspecified hyperlipidemia   . Pain in joint, site unspecified   . Hypertonicity of bladder   . Pain in joint, forearm   . History of renal failure and respiratory acidosis (05/24/2014) 11/20/2014   Past Surgical History  Procedure Laterality Date  . Breast lumpectomy      left multiple benign  . Hand surgery  1988    left tendon transfer  . Vaginal hysterectomy  2005  . Bilateral oophorectomy  2005  . Cervical cone biopsy  2005  . Shoulder arthroscopy  2007    right  . Retinal detachment surgery  2010    right, repair   Prior to Admission medications   Medication Sig Start Date End Date Taking? Authorizing Provider  BRISDELLE 7.5 MG CAPS Take 1 capsule by mouth at bedtime. 09/17/14  Yes Historical Provider, MD  diclofenac (VOLTAREN) 75 MG EC tablet Take 1 tablet by mouth 2 (two) times daily. 12/07/14  Yes Historical Provider, MD  dicyclomine (BENTYL) 20 MG tablet Take 20 mg by mouth daily.  04/12/14  Yes Historical Provider, MD  escitalopram (LEXAPRO) 10 MG tablet Take  1 tablet by mouth daily. 12/08/14  Yes Historical Provider, MD  morphine (MS CONTIN) 15 MG 12 hr tablet Take 1 tablet (15 mg total) by mouth every 12 (twelve) hours. 11/19/14  Yes Delano Metz, MD  MYRBETRIQ 50 MG TB24 tablet 50 mg daily.  06/01/14  Yes Historical Provider, MD  Oxycodone HCl 10 MG TABS Take 1 tablet (10 mg total) by mouth every 8 (eight) hours as needed. 11/19/14  Yes Delano Metz, MD  tiZANidine (ZANAFLEX) 4 MG tablet Take 1 tablet  (4 mg total) by mouth every 8 (eight) hours as needed. 11/19/14  Yes Delano Metz, MD  QUEtiapine (SEROQUEL) 300 MG tablet Take 2 tablets (600 mg total) by mouth at bedtime. 06/22/14   Audery Amel, MD   Allergies  Allergen Reactions  . Alcohol Swelling    overall swelling  . Bextra [Valdecoxib]   . Celebrex [Celecoxib]   . Penicillins     Angioedema & rash  . Sulfa Antibiotics   . Sulfacetamide Sodium   . Vioxx [Rofecoxib]   . Metformin Rash    Sores, ulcers     FAMILY HISTORY   Family History  Problem Relation Age of Onset  . Skin cancer Father     carcinoma, basal cell  . Melanoma Father   . Anemia Mother   . Drug abuse Brother   . Breast cancer Paternal Grandmother   . Diabetes type II Paternal Grandfather   . Other Father     hay fever  . Other Mother     epilepsy      SOCIAL HISTORY    reports that she quit smoking about 2 years ago. She has never used smokeless tobacco. She reports that she does not drink alcohol or use illicit drugs.  Review of Systems  Unable to perform ROS: mental acuity      VITAL SIGNS    Temp:  [98.2 F (36.8 C)] 98.2 F (36.8 C) (12/08 0751) Pulse Rate:  [87-128] 94 (12/08 1436) Resp:  [15-30] 22 (12/08 1228) BP: (121-170)/(70-85) 124/85 mmHg (12/08 1436) SpO2:  [67 %-94 %] 94 % (12/08 1436) Weight:  [220 lb (99.791 kg)] 220 lb (99.791 kg) (12/08 0751) HEMODYNAMICS:   VENTILATOR SETTINGS:   INTAKE / OUTPUT:  Intake/Output Summary (Last 24 hours) at 12/13/14 1756 Last data filed at 12/13/14 1515  Gross per 24 hour  Intake      3 ml  Output      0 ml  Net      3 ml       PHYSICAL EXAM   Physical Exam  Constitutional: She appears well-developed and well-nourished.  HENT:  Head: Normocephalic and atraumatic.  Right Ear: External ear normal.  Left Ear: External ear normal.  Nose: Nose normal.  Mouth/Throat: Oropharynx is clear and moist.  Eyes: Conjunctivae and EOM are normal. Pupils are equal,  round, and reactive to light.  Neck: Normal range of motion. Neck supple. No JVD present. No tracheal deviation present. No thyromegaly present.  Cardiovascular: Normal rate, regular rhythm, normal heart sounds and intact distal pulses.  Exam reveals no gallop and no friction rub.   No murmur heard. Pulmonary/Chest: Effort normal and breath sounds normal. No stridor. No respiratory distress. She has no wheezes. She has no rales.  Abdominal: Soft. Bowel sounds are normal. She exhibits no distension. There is no tenderness. There is no rebound and no guarding.  Genitourinary: Vagina normal.  Musculoskeletal: Normal range of motion. She exhibits no edema  or tenderness.  Lymphadenopathy:    She has no cervical adenopathy.  Neurological: She is alert.  Severe delirium, able to state name and date of birth, not able to give any other pertinent information. Patient with uneasiness and agitation, moving all extremities  Skin: Skin is warm and dry. No rash noted. No erythema. No pallor.       LABS   LABS:  CBC  Recent Labs Lab 12/13/14 0752 12/13/14 1511  WBC 8.8 5.5  HGB 8.7* 7.8*  HCT 28.2* 25.1*  PLT 284 216   Coag's No results for input(s): APTT, INR in the last 168 hours. BMET  Recent Labs Lab 12/13/14 0752 12/13/14 1511  NA 136 137  K 6.6* >7.5*  CL 104 107  CO2 13* 16*  BUN 63* 67*  CREATININE 7.53* 7.46*  7.70*  GLUCOSE 103* 78   Electrolytes  Recent Labs Lab 12/13/14 0752 12/13/14 1511  CALCIUM 8.3* 8.0*  MG  --  2.3   Sepsis Markers  Recent Labs Lab 12/13/14 1511  LATICACIDVEN 1.6   ABG  Recent Labs Lab 12/13/14 1515  PHART 7.20*  PCO2ART 34  PO2ART 75*   Liver Enzymes  Recent Labs Lab 12/13/14 0752 12/13/14 1511  AST 32 29  ALT 16 15  ALKPHOS 105 92  BILITOT 0.9 0.9  ALBUMIN 3.0* 2.7*   Cardiac Enzymes No results for input(s): TROPONINI, PROBNP in the last 168 hours. Glucose  Recent Labs Lab 12/13/14 0807  GLUCAP 97      No results found for this or any previous visit (from the past 240 hour(s)).   Current facility-administered medications:  .  acetaminophen (TYLENOL) tablet 650 mg, 650 mg, Oral, Q6H PRN **OR** acetaminophen (TYLENOL) suppository 650 mg, 650 mg, Rectal, Q6H PRN, Auburn Bilberry, MD .  heparin injection 5,000 Units, 5,000 Units, Subcutaneous, 3 times per day, Auburn Bilberry, MD .  insulin aspart (novoLOG) injection 5 Units, 5 Units, Intravenous, Once, Auburn Bilberry, MD .  meropenem (MERREM) 500 mg in sodium chloride 0.9 % 50 mL IVPB, 500 mg, Intravenous, Q12H, Auburn Bilberry, MD, 500 mg at 12/13/14 1554 .  midazolam (VERSED) injection 2 mg, 2 mg, Intravenous, Once, Azyah Flett, MD .  morphine 2 MG/ML injection 2 mg, 2 mg, Intravenous, Once, Lonald Troiani, MD .  sodium bicarbonate 1 mEq/mL injection, , , ,  .  sodium bicarbonate 1 mEq/mL injection, , , ,  .  sodium bicarbonate 150 mEq in dextrose 5 % 1,000 mL infusion, , Intravenous, Continuous, Lamont Dowdy, MD, Last Rate: 100 mL/hr at 12/13/14 1627 .  sodium chloride 0.9 % injection 3 mL, 3 mL, Intravenous, Q12H, Auburn Bilberry, MD, 3 mL at 12/13/14 1515 .  tuberculin injection 5 Units, 5 Units, Intradermal, Once, Lamont Dowdy, MD  IMAGING    Dg Chest 2 View  12/13/2014  CLINICAL DATA:  Altered mental status, pale, diaphoretic. EXAM: CHEST  2 VIEW COMPARISON:  Chest x-ray of October 09, 2013 FINDINGS: The lungs are hypoinflated. There is crowding of the pulmonary vascularity. The cardiac silhouette is enlarged. The pulmonary vascularity is indistinct. There is no pleural effusion. 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 IMPRESSION: Bilateral pulmonary hypoinflation. Stable enlargement cardiac silhouette. No definite pulmonary edema or pneumonia allowing for vascular crowding. Electronically Signed   By: David  [place] M.D.   On: 12/13/2014 09:17   Ct Head Wo Contrast  12/13/2014  CLINICAL DATA:  Altered mental status. EXAM: CT HEAD  WITHOUT CONTRAST TECHNIQUE: Contiguous axial images were obtained  from the base of the skull through the vertex without intravenous contrast. COMPARISON:  10/08/2013 FINDINGS: Skull and Sinuses:Negative for fracture or destructive process. The visualized mastoids, middle ears, and imaged paranasal sinuses are clear. Orbits: Scleral band on the right.  No acute finding. Brain: No evidence of acute infarction, hemorrhage, hydrocephalus, or mass lesion/mass effect. IMPRESSION: Negative head CT. Electronically Signed   By: Marnee SpringJonathon  Watts M.D.   On: 12/13/2014 08:33   [place] Renal  12/13/2014  CLINICAL DATA:  49 year old female with acute renal failure. Initial encounter. EXAM: RENAL / URINARY TRACT ULTRASOUND COMPLETE COMPARISON:  10/11/2013. FINDINGS: Right Kidney: Evaluation limited by patient's habitus and patient unable to cooperate. Atrophic lobular appearance of the right kidney without hydronephrosis. 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 If further delineation of the right kidney is clinically desired, CT imaging (can be performed without contrast) may be considered. Left Kidney: Length: 9.2 cm. Echogenicity within normal limits. No mass or hydronephrosis visualized. Bladder: Appears normal for degree of bladder distention. IMPRESSION: Exam limited.  Particularly limited evaluation of the right kidney. Right kidney appears atrophic and lobulated. Follow-up as noted above. No hydronephrosis detected. Electronically Signed   By: Lacy DuverneySteven  Olson M.D.   On: 12/13/2014 11:02      Indwelling Urinary Catheter continued, requirement due to   Reason to continue Indwelling Urinary Catheter for strict Intake/Output monitoring for hemodynamic instability   Central Line continued, requirement due to   Reason to continue [employer] Energy[employer] Line Monitoring of central venous pressure or other hemodynamic parameters   Ventilator continued, requirement due to, resp failure    Ventilator Sedation RASS  0 to -2   Cultures: BCx2 12/8>> UC 12/8>> Sputum  Antibiotics: Meropenem 12/8>> Cipro x 1 dose 12/8  Lines: R Fem HD cath (3 lumen) 12/8>>  ASSESSMENT/PLAN  49 history of paralyzed schizophrenia, 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   PULMONARY - Memory status currently stable, patient is protecting her airway, she is a no S3 distress, Maintain saturations greater than 80% Monitor neuro status  CARDIOVASCULAR - Hemodynamically stable -Monitor vital signs -Monitor blood pressure -IV fluids started -Sodium bicarbonate started to alkalinize her urine  RENAL A:   Acute renal failure Hyperkalemia Metabolic acidosis P:   -HD catheter placed IV sodium bicarbonate started 100 mils per hour -Follow-up toxic lab panel which includes acetaminophen, salicylate, somewhat laterally -Emergent dialysis for hyperkalemia -Nephrology following-appreciate recommendations -Monitor urine output  GASTROINTESTINAL SUP  HEMATOLOGIC Anemia -Monitor CBC  INFECTIOUS Suspected urinary tract infection P:   - History of Escherichia coli in the past -Received 1 dose of ciprofloxacin and started on meropenem -Follow-up cultures   ENDOCRINE - ICU hypoglycemia/hyperglycemia protocol  NEUROLOGIC A:   Acute metabolic encephalopathy Acute mental status changes Severe agitation secondary to toxic metabolic encephalopathy P:   RASS goal: 0 - Monitor neuro status - By history no toxic ingestion, follow-up salicylate and acetaminophen levels, follow-up alcohol levels also -We will also check serum osmolality and continue to monitor - Father stated that he rechecked the patient's pill bottles, all pills are accounted for none are missing. -May need to start a Precedex drip  for severe agitation, we'll continue to monitor.  I have spoken with patient's father over the phone and updated him on the current status of the patient and placement of HD catheter along  with emergent dialysis.   Thank you for consulting [facility].  Please feel free to contacts [place] with any questions at [phone] (please enter 7-digits).  I have personally obtained a history, examined the patient, evaluated laboratory and imaging results, formulated the assessment and plan and placed orders.  The Patient requires high complexity decision making for assessment and support, frequent evaluation and titration of therapies, application of advanced monitoring technologies and extensive interpretation of multiple databases. Critical Care Time devoted to patient care services described in this note is 55 minutes.   Overall, patient is critically ill, prognosis is guarded. Patient at high risk for cardiac arrest and death.   Stephanie Acre, MD [facility] Pager [phone] (please enter 7-digits) On Call Pager - [phone] (please enter 7-digits)     12/13/2014, 5:56 PM  Note: This note was prepared with Dragon dictation along with smaller phrase technology. Any transcriptional errors that result from this process are unintentional.

## 2014-12-13 NOTE — ED Notes (Signed)
Pt alert to self, throwing legs off bed, RN in view of patient, pt's father at bedside

## 2014-12-13 NOTE — ED Notes (Addendum)
Pt brought in via EMS with AMS. Pt has psychiatric history. Pt lives with father and sts she sometimes goes into episodes of AMS and being minimally responsive. Pt sts she is hallucinating. Pt sts she took her med this morning. Pt is pale and diaphoretic.

## 2014-12-14 ENCOUNTER — Other Ambulatory Visit: Payer: Self-pay | Admitting: Pain Medicine

## 2014-12-14 ENCOUNTER — Inpatient Hospital Stay: Payer: Medicare Other

## 2014-12-14 LAB — BASIC METABOLIC PANEL
Anion gap: 14 (ref 5–15)
Anion gap: 14 (ref 5–15)
Anion gap: 13 (ref 5–15)
Anion gap: 13 (ref 5–15)
Anion gap: 13 (ref 5–15)
BUN: 48 mg/dL — AB (ref 6–20)
BUN: 51 mg/dL — ABNORMAL HIGH (ref 6–20)
BUN: 47 mg/dL — ABNORMAL HIGH (ref 6–20)
BUN: 43 mg/dL — AB (ref 6–20)
BUN: 39 mg/dL — AB (ref 6–20)
CALCIUM: 8.3 mg/dL — AB (ref 8.9–10.3)
CALCIUM: 8.6 mg/dL — AB (ref 8.9–10.3)
CALCIUM: 8.3 mg/dL — AB (ref 8.9–10.3)
CHLORIDE: 103 mmol/L (ref 101–111)
CO2: 24 mmol/L (ref 22–32)
CO2: 24 mmol/L (ref 22–32)
CO2: 24 mmol/L (ref 22–32)
CO2: 22 mmol/L (ref 22–32)
CO2: 19 mmol/L — AB (ref 22–32)
CREATININE: 4.74 mg/dL — AB (ref 0.44–1.00)
CREATININE: 4.12 mg/dL — AB (ref 0.44–1.00)
CREATININE: 3.76 mg/dL — AB (ref 0.44–1.00)
CREATININE: 2.85 mg/dL — AB (ref 0.44–1.00)
Calcium: 8.1 mg/dL — ABNORMAL LOW (ref 8.9–10.3)
Calcium: 8.4 mg/dL — ABNORMAL LOW (ref 8.9–10.3)
Chloride: 103 mmol/L (ref 101–111)
Chloride: 103 mmol/L (ref 101–111)
Chloride: 103 mmol/L (ref 101–111)
Chloride: 104 mmol/L (ref 101–111)
Creatinine, Ser: 3.35 mg/dL — ABNORMAL HIGH (ref 0.44–1.00)
GFR calc Af Amer: 11 mL/min — ABNORMAL LOW (ref >60)
GFR calc Af Amer: 17 mL/min — ABNORMAL LOW (ref >60)
GFR calc non Af Amer: 12 mL/min — ABNORMAL LOW (ref >60)
GFR calc non Af Amer: 13 mL/min — ABNORMAL LOW (ref >60)
GFR calc non Af Amer: 15 mL/min — ABNORMAL LOW (ref >60)
GFR calc non Af Amer: 18 mL/min — ABNORMAL LOW (ref >60)
GFR, EST AFRICAN AMERICAN: 13 mL/min — AB (ref >60)
GFR, EST AFRICAN AMERICAN: 15 mL/min — AB (ref >60)
GFR, EST AFRICAN AMERICAN: 21 mL/min — AB (ref >60)
GFR, EST NON AFRICAN AMERICAN: 10 mL/min — AB (ref >60)
GLUCOSE: 146 mg/dL — AB (ref 65–99)
GLUCOSE: 159 mg/dL — AB (ref 65–99)
Glucose, Bld: 132 mg/dL — ABNORMAL HIGH (ref 65–99)
Glucose, Bld: 160 mg/dL — ABNORMAL HIGH (ref 65–99)
Glucose, Bld: 161 mg/dL — ABNORMAL HIGH (ref 65–99)
POTASSIUM: 5.1 mmol/L (ref 3.5–5.1)
POTASSIUM: 5.1 mmol/L (ref 3.5–5.1)
Potassium: 5.2 mmol/L — ABNORMAL HIGH (ref 3.5–5.1)
Potassium: 5 mmol/L (ref 3.5–5.1)
Potassium: 5.1 mmol/L (ref 3.5–5.1)
SODIUM: 141 mmol/L (ref 135–145)
SODIUM: 141 mmol/L (ref 135–145)
Sodium: 140 mmol/L (ref 135–145)
Sodium: 138 mmol/L (ref 135–145)
Sodium: 136 mmol/L (ref 135–145)

## 2014-12-14 LAB — BLOOD GAS, ARTERIAL
ACID-BASE DEFICIT: 1.5 mmol/L (ref 0.0–2.0)
Acid-base deficit: 0.9 mmol/L (ref 0.0–2.0)
Allens test (pass/fail): POSITIVE — AB
BICARBONATE: 24.8 meq/L (ref 21.0–28.0)
Bicarbonate: 23.7 mEq/L (ref 21.0–28.0)
FIO2: 0.4
FIO2: 0.28
O2 SAT: 93 %
O2 Saturation: 94.9 %
PCO2 ART: 41 mmHg (ref 32.0–48.0)
PCO2 ART: 45 mmHg (ref 32.0–48.0)
PH ART: 7.37 (ref 7.350–7.450)
PH ART: 7.35 (ref 7.350–7.450)
PO2 ART: 79 mmHg — AB (ref 83.0–108.0)
Patient temperature: 37
Patient temperature: 37
pO2, Arterial: 69 mmHg — ABNORMAL LOW (ref 83.0–108.0)

## 2014-12-14 LAB — CBC
HCT: 27.4 % — ABNORMAL LOW (ref 35.0–47.0)
Hemoglobin: 9.1 g/dL — ABNORMAL LOW (ref 12.0–16.0)
MCH: 28.6 pg (ref 26.0–34.0)
MCHC: 33.3 g/dL (ref 32.0–36.0)
MCV: 86.1 fL (ref 80.0–100.0)
PLATELETS: 239 10*3/uL (ref 150–440)
RBC: 3.18 MIL/uL — AB (ref 3.80–5.20)
RDW: 17.3 % — ABNORMAL HIGH (ref 11.5–14.5)
WBC: 9.3 10*3/uL (ref 3.6–11.0)

## 2014-12-14 LAB — HEPATITIS B SURFACE ANTIGEN: HEP B S AG: NEGATIVE

## 2014-12-14 LAB — CORTISOL: Cortisol, Plasma: 42.6 ug/dL

## 2014-12-14 LAB — HEPATITIS B SURFACE ANTIBODY,QUALITATIVE: Hep B S Ab: NONREACTIVE

## 2014-12-14 LAB — HEPATITIS B CORE ANTIBODY, TOTAL: Hep B Core Total Ab: NEGATIVE

## 2014-12-14 MED ORDER — CHLORHEXIDINE GLUCONATE CLOTH 2 % EX PADS
6.0000 | MEDICATED_PAD | Freq: Every day | CUTANEOUS | Status: AC
  Administered 2014-12-14 – 2014-12-18 (×5): 6 via TOPICAL

## 2014-12-14 MED ORDER — VASOPRESSIN 20 UNIT/ML IV SOLN
0.0300 [IU]/min | INTRAVENOUS | Status: DC
  Administered 2014-12-14: 0.03 [IU]/min via INTRAVENOUS
  Filled 2014-12-14 (×2): qty 2

## 2014-12-14 MED ORDER — MUPIROCIN 2 % EX OINT
1.0000 "application " | TOPICAL_OINTMENT | Freq: Two times a day (BID) | CUTANEOUS | Status: AC
  Administered 2014-12-14 – 2014-12-18 (×9): 1 via NASAL
  Filled 2014-12-14 (×2): qty 22

## 2014-12-14 MED ORDER — SODIUM CHLORIDE 0.9 % IV BOLUS (SEPSIS)
1000.0000 mL | Freq: Once | INTRAVENOUS | Status: AC
  Administered 2014-12-14: 1000 mL via INTRAVENOUS

## 2014-12-14 NOTE — Progress Notes (Signed)
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  SUBJECTIVE:  Overnight patient noted to become more alert, however started to have worsening hypotension, but would improve mentation. Currently on high-dose levo fed and vasopressin. Blood pressure cuff measures systolic's in the 90s on vasopressors. Had 1 hr of HD lastnight, then stopped due to hypotension.  STUDIES:   SIGNIFICANT EVENTS:  VITAL SIGNS: Temp:  [98.1 F (36.7 C)-99.2 F (37.3 C)] 98.8 F (37.1 C) (12/09 0730) Pulse Rate:  [87-100] 99 (12/09 0730) Resp:  [12-27] 27 (12/09 0730) BP: (47-139)/(25-105) 103/48 mmHg (12/09 0730) SpO2:  [88 %-99 %] 93 % (12/09 0730) Weight:  [227 lb 4.7 oz (103.1 kg)] 227 lb 4.7 oz (103.1 kg) (12/08 1800) HEMODYNAMICS:   VENTILATOR SETTINGS:   INTAKE / OUTPUT:  Intake/Output Summary (Last 24 hours) at 12/14/14 1217 Last data filed at 12/14/14 1100  Gross per 24 hour  Intake 2019.17 ml  Output    620 ml  Net 1399.17 ml    Review of Systems  Constitutional: Negative for fever, chills and malaise/fatigue.  Eyes: Negative for blurred vision.  Respiratory: Negative for cough.   Cardiovascular: Negative for chest pain.  Gastrointestinal: Negative for heartburn.  Genitourinary: Negative for dysuria.  Musculoskeletal: Positive for myalgias.  Skin: Negative for itching and rash.  Neurological: Negative for dizziness.  Endo/Heme/Allergies: Does not bruise/bleed  easily.  Psychiatric/Behavioral: Negative for hallucinations.    Physical Exam  Constitutional: She is well-developed, well-nourished, and in no distress. No distress.  HENT:  Head: Normocephalic and atraumatic.  Right Ear: External ear normal.  Left Ear: External ear normal.  Mouth/Throat: No oropharyngeal exudate.  Eyes: Pupils are equal, round, and reactive to light.  Neck: Normal range of motion. Neck supple. No thyromegaly present.  Cardiovascular: Normal rate, regular rhythm and normal heart sounds.   No murmur heard. Pulmonary/Chest: No respiratory distress. She has no wheezes. She has no rales. She exhibits no tenderness.  Abdominal: Soft. Bowel sounds are normal. She exhibits no distension. There is no tenderness.  Genitourinary: Vagina normal.  Musculoskeletal: Normal range of motion.  Neurological: She is alert. No cranial nerve deficit.  More alert, and aware to self today, does have periods of somnolence  Nursing note and vitals reviewed.    LABS:  CBC  Recent Labs Lab 12/13/14 1511 12/13/14 1839 12/14/14 0437  WBC 5.5 4.3 9.3  HGB 7.8* 7.6* 9.1*  HCT 25.1* 23.2* 27.4*  PLT 216 191 239   Coag's No results for input(s): APTT, INR in the last 168 hours. BMET  Recent Labs Lab 12/13/14 2053 12/14/14 0437 12/14/14 1041  NA 141 141 141  K 5.0 5.1 5.2*  CL 103 103 103  CO2 24 24 24   BUN 47* 48* 51*  CREATININE 5.12* 4.74* 4.12*  GLUCOSE 112* 132* 160*   Electrolytes  Recent Labs Lab 12/13/14 1511 12/13/14 1839 12/13/14 2053 12/14/14 0437 12/14/14 1041  CALCIUM 8.0* 7.8* 7.7* 8.1* 8.3*  MG 2.3 2.1  --   --   --  PHOS  --  9.6*  --   --   --    Sepsis Markers  Recent Labs Lab 12/13/14 1511 12/13/14 1839  LATICACIDVEN 1.6 1.4   ABG  Recent Labs Lab 12/13/14 1515 12/14/14 0600  PHART 7.20* 7.37  PCO2ART 34 41  PO2ART 75* 69*   Liver Enzymes  Recent Labs Lab 12/13/14 0752 12/13/14 1511  AST 32 29  ALT 16 15  ALKPHOS 105  92  BILITOT 0.9 0.9  ALBUMIN 3.0* 2.7*   Cardiac Enzymes No results for input(s): TROPONINI, PROBNP in the last 168 hours. Glucose  Recent Labs Lab 12/13/14 0807 12/13/14 1824  GLUCAP 97 101*    Imaging Dg Chest 1 View  12/14/2014  CLINICAL DATA:  Acute onset of shortness of breath and hypotension. Initial encounter. EXAM: CHEST 1 VIEW COMPARISON:  Chest radiograph performed 12/13/2014 FINDINGS: The lungs are hypoexpanded. A small left pleural effusion is suspected. Mildly increased central lung markings could reflect minimal interstitial edema or possibly pneumonia. No pneumothorax is seen. The cardiomediastinal silhouette is borderline normal in size. No acute osseous abnormalities are seen. A left shoulder arthroplasty is incompletely imaged, but appears grossly unremarkable. IMPRESSION: Lungs hypoexpanded. Suspect small left pleural effusion. Mildly increased central lung markings may reflect minimal interstitial edema or possibly pneumonia. Electronically Signed   By: Roanna Raider M.D.   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   PULMONARY - Memory status currently stable, patient is protecting her airway, she is a no S3 distress, Maintain saturations greater than 80% Monitor neuro status  CARDIOVASCULAR - Hemodynamically stable -Monitor vital signs -Monitor blood pressure -IV fluids started -Sodium bicarbonate started to alkalinize her urine  RENAL A:  Acute renal failure Hyperkalemia Metabolic acidosis P:  -HD catheter placed, smaller lumen not  working, attempted A-line placement, but unsuccessful.  -Follow-up toxic lab panel which includes acetaminophen -Emergent dialysis for hyperkalemia- now stable -Nephrology following-appreciate recommendations -Monitor urine output-improving  GASTROINTESTINAL SUP  HEMATOLOGIC Anemia -Monitor CBC  INFECTIOUS Suspected urinary tract infection P:  - History of Escherichia coli in the past -Received 1 dose of ciprofloxacin and started on meropenem -Follow-up cultures   ENDOCRINE - ICU hypoglycemia/hyperglycemia protocol  NEUROLOGIC A:  Acute metabolic encephalopathy Acute mental status changes Severe agitation secondary to toxic metabolic encephalopathy P:  RASS goal: 0 - Monitor neuro status - By history no toxic ingestion, follow-up salicylate and acetaminophen levels, follow-up alcohol levels also -We will also check serum osmolality and continue to monitor - Father stated that he rechecked the patient's pill bottles, all pills are accounted for none are missing. -May need to start a Precedex drip for severe agitation, we'll continue to monitor.   Thank you for consulting [facility], Please feel free to contacts [place] with any questions at [phone] (please enter 7-digits).  I have personally obtained a history, examined the patient, evaluated laboratory and imaging results, formulated the assessment and plan and placed orders.  The Patient requires high complexity decision making for assessment and support, frequent evaluation and titration of therapies, application of advanced monitoring technologies and extensive interpretation of  multiple databases. Critical Care Time devoted to patient care services described in this note is 40 minutes.   Overall, patient is critically ill, prognosis is guarded. Patient at high risk for cardiac arrest and death.    Stephanie Acre, MD [facility] Pager [phone] (please  enter 7-digits) On Call Pager - [phone] (please enter 7-digits)  Note: This note was prepared with Dragon dictation along with smaller phrase technology. Any transcriptional errors that result from this process are unintentional.

## 2014-12-14 NOTE — Progress Notes (Signed)
[facility] at [facility]                                                                                                                                                                                            Patient Demographics   [name] Haas, is a 49 y.o. female, DOB - 08/14/1965, WUJ:[id number]  Admit date - 12/13/2014   Admitting Physician Auburn Bilberry, MD  Outpatient Primary MD for the patient is [name], Johnney Killian, MD   LOS - 1  Subjective: Patient overnight has had episodes of hypotension currently requiring vasopressin and Levophed. Mental status is improved compared to admission but still confused. Patient was started on hemodialysis within 1 hour of hemodialysis she became hypotensive. She was started on norepinephrine.     Review of Systems:   CONSTITUTIONAL:confused  Vitals:   Filed Vitals:   12/14/14 0615 12/14/14 0630 12/14/14 0645 12/14/14 0730  BP: 95/44 88/51 103/53 103/48  Pulse: 99 96 95 99  Temp:    98.8 F (37.1 C)  TempSrc:    Oral  Resp: 20 24 17 27   Height:      Weight:      SpO2: 90% 93% 94% 93%    Wt Readings from Last 3 Encounters:  12/13/14 103.1 kg (227 lb 4.7 oz)  11/19/14 96.163 kg (212 lb)  06/07/14 100.336 kg (221 lb 3.2 oz)     Intake/Output Summary (Last 24 hours) at 12/14/14 1400 Last data filed at 12/14/14 1100  Gross per 24 hour  Intake 2019.17 ml  Output    620 ml  Net 1399.17 ml    Physical Exam:   GENERAL: Critically ill-appearing female HEAD, EYES, EARS, NOSE AND THROAT: Atraumatic, normocephalic. Extraocular muscles are intact. Pupils equal and reactive to light. Sclerae anicteric. No conjunctival injection. No oro-pharyngeal erythema.  NECK: Supple. There is no jugular venous distention. No bruits, no lymphadenopathy, no thyromegaly.  HEART: Regular rate and rhythm,. No murmurs, no rubs, no clicks.  LUNGS: Clear to auscultation bilaterally. No rales or rhonchi. No wheezes.   ABDOMEN: Soft, flat, nontender, nondistended. Has good bowel sounds. No hepatosplenomegaly appreciated.  EXTREMITIES: No evidence of any cyanosis, clubbing, or peripheral edema.  +2 pedal and radial pulses bilaterally.  NEUROLOGIC: The patient is alert, awake, not oriented to time or place oriented to person coronary nerves II-12 grossly intact strength 5 over 5 in all 4 extremities  SKIN: Moist and warm with no rashes appreciated.  Psych: Not anxious, depressed LN: No inguinal LN enlargement    Antibiotics   Anti-infectives    Start     Dose/Rate  Route Frequency Ordered Stop   12/13/14 1445  meropenem (MERREM) 500 mg in sodium chloride 0.9 % 50 mL IVPB     500 mg 100 mL/hr over 30 Minutes Intravenous Every 12 hours 12/13/14 1438     12/13/14 1400  meropenem (MERREM) 1 g in sodium chloride 0.9 % 100 mL IVPB  Status:  Discontinued     1 g 200 mL/hr over 30 Minutes Intravenous 3 times per day 12/13/14 1002 12/13/14 1437   12/13/14 0930  ciprofloxacin (CIPRO) IVPB 400 mg     400 mg 200 mL/hr over 60 Minutes Intravenous  Once 12/13/14 0923 12/13/14 1040      Medications   Scheduled Meds: . Chlorhexidine Gluconate Cloth  6 each Topical Q0600  . heparin  5,000 Units Subcutaneous 3 times per day  . insulin aspart  5 Units Intravenous Once  . meropenem (MERREM) IV  500 mg Intravenous Q12H  . midazolam  2 mg Intravenous Once  .  morphine injection  2 mg Intravenous Once  . mupirocin ointment  1 application Nasal BID  . sodium chloride  3 mL Intravenous Q12H  . tuberculin  5 Units Intradermal Once   Continuous Infusions: . norepinephrine (LEVOPHED) Adult infusion 50 mcg/min (12/14/14 1303)  . vasopressin (PITRESSIN) infusion - *FOR SHOCK* 0.03 Units/min (12/14/14 0700)   PRN Meds:.acetaminophen **OR** acetaminophen   Data Review:   Micro Results Recent Results (from the past 240 hour(s))  Urine culture     Status: None (Preliminary result)   Collection Time: 12/13/14  8:30 AM   Result Value Ref Range Status   Specimen Description URINE, CLEAN CATCH  Final   Special Requests NONE  Final   Culture TOO YOUNG TO READ  Final   Report Status PENDING  Incomplete  MRSA PCR Screening     Status: Abnormal   Collection Time: 12/13/14  3:00 PM  Result Value Ref Range Status   MRSA by PCR POSITIVE (A) NEGATIVE Final    Comment:        The GeneXpert MRSA Assay (FDA approved for NASAL specimens only), is one component of a comprehensive MRSA colonization surveillance program. It is not intended to diagnose MRSA infection nor to guide or monitor treatment for MRSA infections. CRITICAL RESULT CALLED TO, READ BACK BY AND VERIFIED WITH:  DALE HOPKINS AT 2117 12/13/14 SDR   Culture, blood (routine x 2)     Status: None (Preliminary result)   Collection Time: 12/13/14  5:25 PM  Result Value Ref Range Status   Specimen Description BLOOD LEFT WRIST  Final   Special Requests   Final    BOTTLES DRAWN AEROBIC AND ANAEROBIC  1CC AERO, 2CC ANAERO   Culture NO GROWTH < 24 HOURS  Final   Report Status PENDING  Incomplete  Culture, blood (routine x 2)     Status: None (Preliminary result)   Collection Time: 12/13/14  6:39 PM  Result Value Ref Range Status   Specimen Description BLOOD RIGHT ARM  Final   Special Requests BOTTLES DRAWN AEROBIC AND ANAEROBIC 7CC  Final   Culture NO GROWTH < 12 HOURS  Final   Report Status PENDING  Incomplete    Radiology Reports Dg Chest 1 View  12/14/2014  CLINICAL DATA:  Acute onset of shortness of breath and hypotension. Initial encounter. EXAM: CHEST 1 VIEW COMPARISON:  Chest radiograph performed 12/13/2014 FINDINGS: The lungs are hypoexpanded. A small left pleural effusion is suspected. Mildly increased central lung markings could reflect minimal  interstitial edema or possibly pneumonia. No pneumothorax is seen. The cardiomediastinal silhouette is borderline normal in size. No acute osseous abnormalities are seen. A left shoulder arthroplasty  is incompletely imaged, but appears grossly unremarkable. IMPRESSION: Lungs hypoexpanded. Suspect small left pleural effusion. Mildly increased central lung markings may reflect minimal interstitial edema or possibly pneumonia. Electronically Signed   By: Roanna RaiderJeffery  Chang M.D.   On: 12/14/2014 01:58   Dg Chest 2 View  12/13/2014  CLINICAL DATA:  Altered mental status, pale, diaphoretic. EXAM: CHEST  2 VIEW COMPARISON:  Chest x-ray of October 09, 2013 FINDINGS: The lungs are hypoinflated. There is crowding of the pulmonary vascularity. The cardiac silhouette is enlarged. The pulmonary vascularity is indistinct. There is no pleural effusion. 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 IMPRESSION: Bilateral pulmonary hypoinflation. Stable enlargement cardiac silhouette. No definite pulmonary edema or pneumonia allowing for vascular crowding. Electronically Signed   By: David  SwazilandJordan M.D.   On: 12/13/2014 09:17   Ct Head Wo Contrast  12/13/2014  CLINICAL DATA:  Altered mental status. EXAM: CT HEAD WITHOUT CONTRAST TECHNIQUE: Contiguous axial images were obtained from the base of the skull through the vertex without intravenous contrast. COMPARISON:  10/08/2013 FINDINGS: Skull and Sinuses:Negative for fracture or destructive process. The visualized mastoids, middle ears, and imaged paranasal sinuses are clear. Orbits: Scleral band on the right.  No acute finding. Brain: No evidence of acute infarction, hemorrhage, hydrocephalus, or mass lesion/mass effect. IMPRESSION: Negative head CT. Electronically Signed   By: Marnee SpringJonathon  Watts M.D.   On: 12/13/2014 08:33   [place] Renal  12/13/2014  CLINICAL DATA:  49 year old female with acute renal failure. Initial encounter. EXAM: RENAL / URINARY TRACT ULTRASOUND COMPLETE COMPARISON:  10/11/2013. FINDINGS: Right Kidney: Evaluation limited by patient's habitus and patient unable to cooperate. Atrophic lobular appearance of the right kidney without hydronephrosis. 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 If further delineation of the right kidney is clinically desired, CT imaging (can be performed without contrast) may be considered. Left Kidney: Length: 9.2 cm. Echogenicity within normal limits. No mass or hydronephrosis visualized. Bladder: Appears normal for degree of bladder distention. IMPRESSION: Exam limited.  Particularly limited evaluation of the right kidney. Right kidney appears atrophic and lobulated. Follow-up as noted above. No hydronephrosis detected. Electronically Signed   By: Lacy DuverneySteven  Olson M.D.   On: 12/13/2014 11:02     CBC  Recent Labs Lab 12/13/14 0752 12/13/14 1511 12/13/14 1839 12/14/14 0437  WBC 8.8 5.5 4.3 9.3  HGB 8.7* 7.8* 7.6* 9.1*  HCT 28.2* 25.1* 23.2* 27.4*  PLT 284 216 191 239  MCV 89.5 88.4 86.6 86.1  MCH 27.6 27.5 28.3 28.6  MCHC 30.8* 31.2* 32.6 33.3  RDW 17.6* 17.3* 17.1* 17.3*    Chemistries   Recent Labs Lab 12/13/14 0752 12/13/14 1511 12/13/14 1839 12/13/14 2053 12/14/14 0437 12/14/14 1041  NA 136 137 139 141 141 141  K 6.6* >7.5* 7.4* 5.0 5.1 5.2*  CL 104 107 106 103 103 103  CO2 13* 16* 19* 24 24 24   GLUCOSE 103* 78 97 112* 132* 160*  BUN 63* 67* 68* 47* 48* 51*  CREATININE 7.53* 7.46*  7.70* 7.54* 5.12* 4.74* 4.12*  CALCIUM 8.3* 8.0* 7.8* 7.7* 8.1* 8.3*  MG  --  2.3 2.1  --   --   --   AST 32 29  --   --   --   --   ALT 16 15  --   --   --   --  ALKPHOS 105 92  --   --   --   --   BILITOT 0.9 0.9  --   --   --   --    ------------------------------------------------------------------------------------------------------------------ estimated creatinine clearance is 19.6 mL/min (by C-G formula based on Cr of 4.12). ------------------------------------------------------------------------------------------------------------------  Recent Labs  12/13/14 1511  HGBA1C 4.6    ------------------------------------------------------------------------------------------------------------------ No results for input(s): CHOL, HDL, LDLCALC, TRIG, CHOLHDL, LDLDIRECT in the last 72 hours. ------------------------------------------------------------------------------------------------------------------  Recent Labs  12/13/14 1511  TSH 2.906   ------------------------------------------------------------------------------------------------------------------ No results for input(s): VITAMINB12, FOLATE, FERRITIN, TIBC, IRON, RETICCTPCT in the last 72 hours.  Coagulation profile No results for input(s): INR, PROTIME in the last 168 hours.  No results for input(s): DDIMER in the last 72 hours.  Cardiac Enzymes No results for input(s): CKMB, TROPONINI, MYOGLOBIN in the last 168 hours.  Invalid input(s): CK ------------------------------------------------------------------------------------------------------------------ Invalid input(s): POCBNP    Assessment & Plan   IMPRESSION AND PLAN: Patient is a 49 year old white female brought in by family due to confusion 1. Acute encephalopathy: The combination of acute renal failure and ATN. Continue supportive care does not improve consider MRI and neurology input. 2. Hypotension could be related to urinary tract infection and sepsis. We will get get a random cortisol level. Continue on vasopressin and Levophed 3. Acute renal failure suspect due to ATN: IV fluids, Foley, renal ultrasound and nephrology following. Hemodialysis attempted but became hypotensive. CRRT per nephrology 4. Urinary tract infection: At this time I'll place the patient on Invanz  5 Hyperkalemia resolved.  6.  Bipolar disorder hold all her psychiatric medications       Code Status Orders        Start     Ordered   12/13/14 1510  Full code   Continuous     12/13/14 1509           Consults  nephrologlo  DVT Prophylaxis  heparin  Lab Results  Component Value Date   PLT 239 12/14/2014     Time Spent in minutes  crtical care   Auburn Bilberry M.D on 12/14/2014 at 2:00 PM  Between 7am to 6pm - Pager - [phone]  After 6pm go to [email or web address] - password [email or web address] [facility]  [facility]   Office  [phone]

## 2014-12-14 NOTE — Progress Notes (Signed)
[facility]  ROUNDING NOTE   Subjective:   Critically ill. On norepinephrine gtt 65mcg and vasopressin 0.03 gtt More awake, still not oriented Potassium now at goal UOP 700 1 hour hemodialysis treatment yesterday night.   Objective:  Vital signs in last 24 hours:  Temp:  [98.1 F (36.7 C)-99.2 F (37.3 C)] 98.8 F (37.1 C) (12/09 0730) Pulse Rate:  [87-112] 99 (12/09 0730) Resp:  [12-28] 27 (12/09 0730) BP: (47-160)/(25-105) 103/48 mmHg (12/09 0730) SpO2:  [88 %-99 %] 93 % (12/09 0730) Weight:  [103.1 kg (227 lb 4.7 oz)] 103.1 kg (227 lb 4.7 oz) (12/08 1800)  Weight change:  Filed Weights   12/13/14 0751 12/13/14 1800  Weight: 99.791 kg (220 lb) 103.1 kg (227 lb 4.7 oz)    Intake/Output: I/O last 3 completed shifts: In: 1966.2 [I.V.:1816.2; IV Piggyback:150] Out: 200 [Urine:700]   Intake/Output this shift:     Physical Exam: General: Critically ill  Head: Normocephalic, atraumatic. Moist oral mucosal membranes  Eyes: Anicteric, PERRL  Neck: Supple, trachea midline  Lungs:  Coarse crackles  Heart: Regular rate and rhythm  Abdomen:  Soft, nontender, obese  Extremities: no peripheral edema.  Neurologic: Not oriented, moving all extremities  Skin: No lesions  Access: Right femoral trialysis 12/8 Dr.Mungal    Basic Metabolic Panel:  Recent Labs Lab 12/13/14 0752 12/13/14 1511 12/13/14 1839 12/13/14 2053 12/14/14 0437  NA 136 137 139 141 141  K 6.6* >7.5* 7.4* 5.0 5.1  CL 104 107 106 103 103  CO2 13* 16* 19* 24 24  GLUCOSE 103* 78 97 112* 132*  BUN 63* 67* 68* 47* 48*  CREATININE 7.53* 7.46*  7.70* 7.54* 5.12* 4.74*  CALCIUM 8.3* 8.0* 7.8* 7.7* 8.1*  MG  --  2.3 2.1  --   --   PHOS  --   --  9.6*  --   --     Liver Function Tests:  Recent Labs Lab 12/13/14 0752 12/13/14 1511  AST 32 29  ALT 16 15  ALKPHOS 105 92  BILITOT 0.9 0.9  PROT 6.8 6.2*  ALBUMIN 3.0* 2.7*   No results for input(s): LIPASE, AMYLASE in the last 168  hours.  Recent Labs Lab 12/13/14 0830  AMMONIA 36*    CBC:  Recent Labs Lab 12/13/14 0752 12/13/14 1511 12/13/14 1839 12/14/14 0437  WBC 8.8 5.5 4.3 9.3  HGB 8.7* 7.8* 7.6* 9.1*  HCT 28.2* 25.1* 23.2* 27.4*  MCV 89.5 88.4 86.6 86.1  PLT 284 216 191 239    Cardiac Enzymes: No results for input(s): CKTOTAL, CKMB, CKMBINDEX, TROPONINI in the last 168 hours.  BNP: Invalid input(s): POCBNP  CBG:  Recent Labs Lab 12/13/14 0807 12/13/14 1824  GLUCAP 97 101*    Microbiology: Results for orders placed or performed during the hospital encounter of 12/13/14  MRSA PCR Screening     Status: Abnormal   Collection Time: 12/13/14  3:00 PM  Result Value Ref Range Status   MRSA by PCR POSITIVE (A) NEGATIVE Final    Comment:        The GeneXpert MRSA Assay (FDA approved for NASAL specimens only), is one component of a comprehensive MRSA colonization surveillance program. It is not intended to diagnose MRSA infection nor to guide or monitor treatment for MRSA infections. CRITICAL RESULT CALLED TO, READ BACK BY AND VERIFIED WITH:  DALE HOPKINS AT 2117 12/13/14 SDR   Culture, blood (routine x 2)     Status: None (Preliminary result)  Collection Time: 12/13/14  5:25 PM  Result Value Ref Range Status   Specimen Description BLOOD LEFT WRIST  Final   Special Requests   Final    BOTTLES DRAWN AEROBIC AND ANAEROBIC  1CC AERO, 2CC ANAERO   Culture NO GROWTH < 24 HOURS  Final   Report Status PENDING  Incomplete  Culture, blood (routine x 2)     Status: None (Preliminary result)   Collection Time: 12/13/14  6:39 PM  Result Value Ref Range Status   Specimen Description BLOOD RIGHT ARM  Final   Special Requests BOTTLES DRAWN AEROBIC AND ANAEROBIC 7CC  Final   Culture NO GROWTH < 12 HOURS  Final   Report Status PENDING  Incomplete    Coagulation Studies: No results for input(s): LABPROT, INR in the last 72 hours.  Urinalysis:  Recent Labs  12/13/14 0830  COLORURINE  AMBER*  LABSPEC 1.020  PHURINE 5.0  GLUCOSEU NEGATIVE  HGBUR NEGATIVE  BILIRUBINUR NEGATIVE  KETONESUR NEGATIVE  PROTEINUR 30*  NITRITE NEGATIVE  LEUKOCYTESUR 2+*      Imaging: Dg Chest 1 View  12/14/2014  CLINICAL DATA:  Acute onset of shortness of breath and hypotension. Initial encounter. EXAM: CHEST 1 VIEW COMPARISON:  Chest radiograph performed 12/13/2014 FINDINGS: The lungs are hypoexpanded. A small left pleural effusion is suspected. Mildly increased central lung markings could reflect minimal interstitial edema or possibly pneumonia. No pneumothorax is seen. The cardiomediastinal silhouette is borderline normal in size. No acute osseous abnormalities are seen. A left shoulder arthroplasty is incompletely imaged, but appears grossly unremarkable. IMPRESSION: Lungs hypoexpanded. Suspect small left pleural effusion. Mildly increased central lung markings may reflect minimal interstitial edema or possibly pneumonia. Electronically Signed   By: Roanna Raider M.D.   On: 12/14/2014 01:58   Dg Chest 2 View  12/13/2014  CLINICAL DATA:  Altered mental status, pale, diaphoretic. EXAM: CHEST  2 VIEW COMPARISON:  Chest x-ray of October 09, 2013 FINDINGS: The lungs are hypoinflated. There is crowding of the pulmonary vascularity. The cardiac silhouette is enlarged. The pulmonary vascularity is indistinct. There is no pleural effusion. 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 IMPRESSION: Bilateral pulmonary hypoinflation. Stable enlargement cardiac silhouette. No definite pulmonary edema or pneumonia allowing for vascular crowding. Electronically Signed   By: David  Swaziland M.D.   On: 12/13/2014 09:17   Ct Head Wo Contrast  12/13/2014  CLINICAL DATA:  Altered mental status. EXAM: CT HEAD WITHOUT CONTRAST TECHNIQUE: Contiguous axial images were obtained from the base of the skull through the vertex without intravenous contrast. COMPARISON:  10/08/2013 FINDINGS: Skull and Sinuses:Negative for fracture or  destructive process. The visualized mastoids, middle ears, and imaged paranasal sinuses are clear. Orbits: Scleral band on the right.  No acute finding. Brain: No evidence of acute infarction, hemorrhage, hydrocephalus, or mass lesion/mass effect. IMPRESSION: Negative head CT. Electronically Signed   By: Marnee Spring M.D.   On: 12/13/2014 08:33   US Renal  12/13/2014  CLINICAL DATA:  49 year old female with acute renal failure. Initial encounter. EXAM: RENAL / URINARY TRACT ULTRASOUND COMPLETE COMPARISON:  10/11/2013. FINDINGS: Right Kidney: Evaluation limited by patient's habitus and patient unable to cooperate. Atrophic lobular appearance of the right kidney without hydronephrosis. 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 If further delineation of the right kidney is clinically desired, CT imaging (can be performed without contrast) may be considered. Left Kidney: Length: 9.2 cm. Echogenicity within normal limits. No mass or hydronephrosis visualized. Bladder: Appears normal for degree of  bladder distention. IMPRESSION: Exam limited.  Particularly limited evaluation of the right kidney. Right kidney appears atrophic and lobulated. Follow-up as noted above. No hydronephrosis detected. Electronically Signed   By: Lacy Duverney M.D.   On: 12/13/2014 11:02     Medications:   . norepinephrine (LEVOPHED) Adult infusion 65 mcg/min (12/14/14 0730)  . vasopressin (PITRESSIN) infusion - *FOR SHOCK* 0.03 Units/min (12/14/14 0700)   . Chlorhexidine Gluconate Cloth  6 each Topical Q0600  . heparin  5,000 Units Subcutaneous 3 times per day  . insulin aspart  5 Units Intravenous Once  . meropenem (MERREM) IV  500 mg Intravenous Q12H  . midazolam  2 mg Intravenous Once  .  morphine injection  2 mg Intravenous Once  . mupirocin ointment  1 application Nasal BID  . sodium chloride  3 mL Intravenous Q12H  . tuberculin  5 Units Intradermal Once   acetaminophen **OR**  acetaminophen  Assessment/ Plan:  [name] Haas is a 49 y.o. white female with chronic pain syndrome, schizophrenia, restless leg syndrome, irritable bowel syndrome, history of pulmonary embolism who was admitted to [facility] on 12/13/2014   1. Acute renal failure on chronic kidney disease stage III: baseline creatinine of 1.16 from 12/07/14. 2. Hyperkalemia 3. Metabolic acidosis 4. Metabolic encephalopathy 5. Anemia  Nonoliguric urine output this morning. Potassium, ABG and electrolytes now at goal. Anion gap has closed Continues to be hemodynamically unstable requiring vasopressors.  Low threshold to resume dialysis. Due to instability/critical illness: would do CRRT if needed.  Hemodialysis emergent treatment on 12/8 for hyperkalemia and metabolic acidosis. With large anion gap metabolic acidosis presentation with altered mental status: concern for ingestion of an agent. UDS shows what patient is usually prescribed. Family keeps her medications away from patient. She seems to not have access to ethanol, ethylene glycol or methanol. Salicylate and acetaminophen levels normal. Lactic acid level normal Serum osm measured is pending.  - Monitor urine output, volume status, serum electrolytes and renal function closely - BMP q 6 hours for now.    LOS: 1 Jennifier Smitherman 12/9/20168:07 AM

## 2014-12-14 NOTE — Progress Notes (Signed)
eLink Physician-Brief Progress Note Patient Name: Lita MainsKendra A Darwish DOB: [birth year] MRN: [id number]   Date of Service  12/14/2014  HPI/Events of Note  Blood pressure tenuous.  eICU Interventions  Will add vasopressin.  Will get ABG.     Intervention Category Major Interventions: Other:  Hridhaan Yohn 12/14/2014, 5:27 AM

## 2014-12-14 NOTE — Progress Notes (Signed)
ANTIBIOTIC CONSULT NOTE - INITIAL  Pharmacy Consult for meropenem Indication: UTI  Allergies  Allergen Reactions  . Alcohol Swelling    overall swelling  . Bextra [Valdecoxib]   . Celebrex [Celecoxib]   . Penicillins     Angioedema & rash  . Sulfa Antibiotics   . Sulfacetamide Sodium   . Vioxx [Rofecoxib]   . Metformin Rash    Sores, ulcers    Patient Measurements: Height: 5\' 6"  (167.6 cm) Weight: 227 lb 4.7 oz (103.1 kg) IBW/kg (Calculated) : 59.3 Adjusted Body Weight:   Vital Signs: Temp: 97.8 F (36.6 C) (12/09 1200) Temp Source: Oral (12/09 1200) BP: 123/63 mmHg (12/09 1400) Pulse Rate: 96 (12/09 1400) Intake/Output from previous day: 12/08 0701 - 12/09 0700 In: 1966.2 [I.V.:1816.2; IV Piggyback:150] Out: 200 [Urine:700] Intake/Output from this shift: Total I/O In: 53 [I.V.:3; IV Piggyback:50] Out: 745 [Urine:745]  Labs:  Recent Labs  12/13/14 1511 12/13/14 1839  12/14/14 0437 12/14/14 1041 12/14/14 1357  WBC 5.5 4.3  --  9.3  --   --   HGB 7.8* 7.6*  --  9.1*  --   --   PLT 216 191  --  239  --   --   CREATININE 7.46*  7.70* 7.54*  < > 4.74* 4.12* 3.76*  < > = values in this interval not displayed. Estimated Creatinine Clearance: 21.5 mL/min (by C-G formula based on Cr of 3.76). No results for input(s): VANCOTROUGH, VANCOPEAK, VANCORANDOM, GENTTROUGH, GENTPEAK, GENTRANDOM, TOBRATROUGH, TOBRAPEAK, TOBRARND, AMIKACINPEAK, AMIKACINTROU, AMIKACIN in the last 72 hours.   Microbiology: Recent Results (from the past 720 hour(s))  Urine culture     Status: None (Preliminary result)   Collection Time: 12/13/14  8:30 AM  Result Value Ref Range Status   Specimen Description URINE, CLEAN CATCH  Final   Special Requests NONE  Final   Culture TOO YOUNG TO READ  Final   Report Status PENDING  Incomplete  MRSA PCR Screening     Status: Abnormal   Collection Time: 12/13/14  3:00 PM  Result Value Ref Range Status   MRSA by PCR POSITIVE (A) NEGATIVE Final     Comment:        The GeneXpert MRSA Assay (FDA approved for NASAL specimens only), is one component of a comprehensive MRSA colonization surveillance program. It is not intended to diagnose MRSA infection nor to guide or monitor treatment for MRSA infections. CRITICAL RESULT CALLED TO, READ BACK BY AND VERIFIED WITH:  DALE HOPKINS AT 2117 12/13/14 SDR   Culture, blood (routine x 2)     Status: None (Preliminary result)   Collection Time: 12/13/14  5:25 PM  Result Value Ref Range Status   Specimen Description BLOOD LEFT WRIST  Final   Special Requests   Final    BOTTLES DRAWN AEROBIC AND ANAEROBIC  1CC AERO, 2CC ANAERO   Culture NO GROWTH < 24 HOURS  Final   Report Status PENDING  Incomplete  Culture, blood (routine x 2)     Status: None (Preliminary result)   Collection Time: 12/13/14  6:39 PM  Result Value Ref Range Status   Specimen Description BLOOD RIGHT ARM  Final   Special Requests BOTTLES DRAWN AEROBIC AND ANAEROBIC 7CC  Final   Culture NO GROWTH < 12 HOURS  Final   Report Status PENDING  Incomplete    Medical History: Past Medical History  Diagnosis Date  . Other chronic pain   . Restless legs syndrome (RLS)   . Disorder  of bone and cartilage, unspecified   . Unspecified essential hypertension   . Esophageal reflux   . Personal history of malignant neoplasm of cervix uteri   . Irritable bowel syndrome   . Contact dermatitis and other eczema, due to unspecified cause   . Lumbago   . Migraine, unspecified, without mention of intractable migraine without mention of status migrainosus   . Unspecified schizophrenia, unspecified condition   . Personal history of colonic polyps   . Symptomatic menopausal or female climacteric states   . Other and unspecified hyperlipidemia   . Pain in joint, site unspecified   . Hypertonicity of bladder   . Pain in joint, forearm   . History of renal failure and respiratory acidosis (05/24/2014) 11/20/2014    Medications:   Scheduled:  . Chlorhexidine Gluconate Cloth  6 each Topical Q0600  . heparin  5,000 Units Subcutaneous 3 times per day  . insulin aspart  5 Units Intravenous Once  . meropenem (MERREM) IV  500 mg Intravenous Q12H  . midazolam  2 mg Intravenous Once  .  morphine injection  2 mg Intravenous Once  . mupirocin ointment  1 application Nasal BID  . sodium chloride  3 mL Intravenous Q12H  . tuberculin  5 Units Intradermal Once   Assessment: Pt is a 49 year old female in acute renal failure with a possible UTI. Pt has a penicillin allergy (angioedema/rash). Pt ordered meropenem.  Goal of Therapy:  resolution of infection  Plan:  Follow up culture results Continue meropenem  q 12 hours. Pharmacy to monitor changes in renal function requiring dose adjustments.  Luisa Hart D 12/14/2014,2:47 PM

## 2014-12-14 NOTE — Plan of Care (Signed)
Problem: Education: Goal: Knowledge of [facility] Education information/materials will improve Outcome: Not Met (add Reason) Patient remains lethargic on and off today. Will continue to assess and monitor.   Comments:  Patient remains lethargic on and off today. Will continue to assess and monitor.

## 2014-12-14 NOTE — Progress Notes (Signed)
Dr. Dema SeverinMungal and Dr. Wynelle LinkKolluru made aware of no blood return out of purple, distal trialaysis port.

## 2014-12-14 NOTE — Procedures (Signed)
  [facility] Medicine Consultation    Procedure Note:  Arterial Line Placement - Unsuccessful [name] MainsKendra A Haas , [id number] , [id number]  Indications:  Hemodynamic instability / recurrent ABG draws  Benefits, risks (including bleeding, infection,  Injury, etc.), and alternatives explained to [name] who voiced understanding.   Questions were sought and answered.  Father agreed to proceed with the procedure.  Consent is signed and on chart.    Time out was performed verifying correct patient, procedure, site, positioning, and special catheter was available at the time of procedure.  Patient's RIGHT FEMORAL artery was prepped and draped in usual sterile fashion.  1 % Lidocaine WAS used to anesthetize the area.  Total number of attempts were 4.  Unsuccessful at placing arterial line, could not successful puncture artery to place line.   Stephanie AcreVishal Alec Mcphee, MD  Pulmonary and Critical Care Pager [phone]- [phone] (please enter 7-digits) On Call Pager - [phone] (please enter 7-digits)

## 2014-12-14 NOTE — Progress Notes (Signed)
eLink Physician-Brief Progress Note Patient Name: [name] MainsKendra A Haas DOB: Jan 31, 1965 MRN: [id number]   Date of Service  12/14/2014  HPI/Events of Note  Pt hypotensive.  eICU Interventions  Will give fluid bolus NS and monitor response.     Intervention Category Major Interventions: Other:  Vyctoria Dickman 12/14/2014, 1:09 AM

## 2014-12-15 ENCOUNTER — Encounter: Payer: Self-pay | Admitting: Pulmonary Disease

## 2014-12-15 LAB — BASIC METABOLIC PANEL
ANION GAP: 9 (ref 5–15)
BUN: 36 mg/dL — ABNORMAL HIGH (ref 6–20)
CHLORIDE: 104 mmol/L (ref 101–111)
CO2: 25 mmol/L (ref 22–32)
Calcium: 8.4 mg/dL — ABNORMAL LOW (ref 8.9–10.3)
Creatinine, Ser: 2.16 mg/dL — ABNORMAL HIGH (ref 0.44–1.00)
GFR calc non Af Amer: 25 mL/min — ABNORMAL LOW (ref >60)
GFR, EST AFRICAN AMERICAN: 29 mL/min — AB (ref >60)
Glucose, Bld: 137 mg/dL — ABNORMAL HIGH (ref 65–99)
Potassium: 5.1 mmol/L (ref 3.5–5.1)
Sodium: 138 mmol/L (ref 135–145)

## 2014-12-15 LAB — COMPREHENSIVE METABOLIC PANEL
ALBUMIN: 2.4 g/dL — AB (ref 3.5–5.0)
ALK PHOS: 71 U/L (ref 38–126)
ALT: 15 U/L (ref 14–54)
AST: 24 U/L (ref 15–41)
Anion gap: 7 (ref 5–15)
BUN: 32 mg/dL — AB (ref 6–20)
CALCIUM: 8.4 mg/dL — AB (ref 8.9–10.3)
CO2: 25 mmol/L (ref 22–32)
CREATININE: 1.79 mg/dL — AB (ref 0.44–1.00)
Chloride: 107 mmol/L (ref 101–111)
GFR calc non Af Amer: 32 mL/min — ABNORMAL LOW (ref >60)
GFR, EST AFRICAN AMERICAN: 37 mL/min — AB (ref >60)
GLUCOSE: 115 mg/dL — AB (ref 65–99)
Potassium: 4.5 mmol/L (ref 3.5–5.1)
SODIUM: 139 mmol/L (ref 135–145)
Total Bilirubin: 0.8 mg/dL (ref 0.3–1.2)
Total Protein: 5.9 g/dL — ABNORMAL LOW (ref 6.5–8.1)

## 2014-12-15 LAB — GLUCOSE, CAPILLARY
Glucose-Capillary: 99 mg/dL (ref 65–99)
Glucose-Capillary: 115 mg/dL — ABNORMAL HIGH (ref 65–99)
Glucose-Capillary: 93 mg/dL (ref 65–99)

## 2014-12-15 MED ORDER — LORAZEPAM 2 MG/ML IJ SOLN
1.0000 mg | Freq: Once | INTRAMUSCULAR | Status: AC
  Administered 2014-12-15: 1 mg via INTRAVENOUS

## 2014-12-15 MED ORDER — DEXMEDETOMIDINE HCL IN NACL 400 MCG/100ML IV SOLN
0.4000 ug/kg/h | INTRAVENOUS | Status: DC
  Administered 2014-12-15: 0.5 ug/kg/h via INTRAVENOUS
  Administered 2014-12-15: 1 ug/kg/h via INTRAVENOUS
  Administered 2014-12-15 – 2014-12-16 (×4): 0.5 ug/kg/h via INTRAVENOUS
  Filled 2014-12-15 (×5): qty 100

## 2014-12-15 MED ORDER — ZIPRASIDONE MESYLATE 20 MG IM SOLR
10.0000 mg | Freq: Once | INTRAMUSCULAR | Status: AC
  Administered 2014-12-15: 10 mg via INTRAMUSCULAR
  Filled 2014-12-15 (×2): qty 20

## 2014-12-15 MED ORDER — MORPHINE SULFATE (PF) 2 MG/ML IV SOLN
2.0000 mg | INTRAVENOUS | Status: DC | PRN
  Administered 2014-12-15 – 2014-12-22 (×8): 2 mg via INTRAVENOUS
  Filled 2014-12-15 (×8): qty 1

## 2014-12-15 MED ORDER — ZIPRASIDONE MESYLATE 20 MG IM SOLR
10.0000 mg | Freq: Three times a day (TID) | INTRAMUSCULAR | Status: DC | PRN
  Filled 2014-12-15: qty 20

## 2014-12-15 MED ORDER — ZIPRASIDONE MESYLATE 20 MG IM SOLR: 20.0000 mg | Freq: Once | INTRAMUSCULAR | Status: DC

## 2014-12-15 MED ORDER — LORAZEPAM 2 MG/ML IJ SOLN
INTRAMUSCULAR | Status: AC
  Administered 2014-12-15: 1 mg via INTRAVENOUS
  Filled 2014-12-15: qty 1

## 2014-12-15 MED ORDER — ZIPRASIDONE MESYLATE 20 MG IM SOLR
20.0000 mg | Freq: Once | INTRAMUSCULAR | Status: AC
  Administered 2014-12-15: 20 mg via INTRAMUSCULAR

## 2014-12-15 NOTE — Progress Notes (Signed)
Pt is now resting quietly, asleep after receiving medications and being placed on precedex drip.  Vital signs remain stable, maintaining airway.  During CODE 300, 4 nurses and 2 police officers were required to safely physically restrain pt.  Pt was restrained from her arms and legs, no restraint to chest.  No restriction to airway or ventilation.  Pt was observed continuously, and constant vital signs were performed throughout the retraint.  The event lasted approximately 45 minutes.  Upon termination of the event, the pt is quiet, calm and sleeping.  Vital signs remain stable, circulation remains normal.

## 2014-12-15 NOTE — Progress Notes (Signed)
PULMONARY / CRITICAL CARE MEDICINE   Name: [name] Haas MRN: [id number] DOB: 02/03/1965    ADMISSION DATE:  12/13/2014  BRIEF HISTORY: 49 year old female past medical history of paranoid schizophrenia,  chronic pain syndrome secondary to total right shoulder replacement presents and acute mental status changes found to be in acute renal failure with critically elevated potassium. Patient was emergently started on dialysis, course complicated by psych issues with delirium and agitation. She has also had persistent hypotension  SUBJECTIVE:   patient had acute delirium overnight, severe agitation. She continues to have a waxing and waning mental status.   STUDIES:   SIGNIFICANT EVENTS:  VITAL SIGNS: Temp:  [97.8 F (36.6 C)-99.9 F (37.7 C)] 98.7 F (37.1 C) (12/10 0530) Pulse Rate:  [45-98] 51 (12/10 0630) Resp:  [14-27] 17 (12/10 0630) BP: (89-152)/(40-87) 130/61 mmHg (12/10 0630) SpO2:  [91 %-100 %] 96 % (12/10 0630) HEMODYNAMICS:   VENTILATOR SETTINGS:   INTAKE / OUTPUT:  Intake/Output Summary (Last 24 hours) at 12/15/14 0751 Last data filed at 12/15/14 0530  Gross per 24 hour  Intake 641.84 ml  Output   3765 ml  Net -3123.16 ml    Review of Systems  Constitutional: Negative for fever, chills and malaise/fatigue.  Eyes: Negative for blurred vision.  Respiratory: Negative for cough.   Cardiovascular: Negative for chest pain.  Gastrointestinal: Negative for heartburn.  Genitourinary: Negative for dysuria.  Musculoskeletal: Positive for myalgias.  Skin: Negative for itching and rash.  Neurological: Negative for dizziness.  Endo/Heme/Allergies: Does not bruise/bleed easily.  Psychiatric/Behavioral: Negative for hallucinations.    Physical Exam  Constitutional: She is well-developed, well-nourished, and in no distress. No distress.  HENT:  Head: Normocephalic and atraumatic.  Right Ear: External ear normal.  Left Ear: External ear normal.  Mouth/Throat: No  oropharyngeal exudate.  Eyes: Pupils are equal, round, and reactive to light.  Neck: Normal range of motion. Neck supple. No thyromegaly present.  Cardiovascular: Normal rate, regular rhythm and normal heart sounds.   No murmur heard. Pulmonary/Chest: No respiratory distress. She has no wheezes. She has no rales. She exhibits no tenderness.  Abdominal: Soft. Bowel sounds are normal. She exhibits no distension. There is no tenderness.  Genitourinary: Vagina normal.  Musculoskeletal: Normal range of motion.  Neurological: She is alert. No cranial nerve deficit.  More alert, and aware to self today, does have periods of somnolence  Nursing note and vitals reviewed.    LABS:  CBC  Recent Labs Lab 12/13/14 1511 12/13/14 1839 12/14/14 0437  WBC 5.5 4.3 9.3  HGB 7.8* 7.6* 9.1*  HCT 25.1* 23.2* 27.4*  PLT 216 191 239   Coag's No results for input(s): APTT, INR in the last 168 hours. BMET  Recent Labs Lab 12/14/14 1620 12/14/14 2204 12/15/14 0455  NA 138 136 138  K 5.1 5.1 5.1  CL 103 104 104  CO2 22 19* 25  BUN 43* 39* 36*  CREATININE 3.35* 2.85* 2.16*  GLUCOSE 146* 159* 137*   Electrolytes  Recent Labs Lab 12/13/14 1511 12/13/14 1839  12/14/14 1620 12/14/14 2204 12/15/14 0455  CALCIUM 8.0* 7.8*  < > 8.4* 8.3* 8.4*  MG 2.3 2.1  --   --   --   --   PHOS  --  9.6*  --   --   --   --   < > = values in this interval not displayed. Sepsis Markers  Recent Labs Lab 12/13/14 1511 12/13/14 1839  LATICACIDVEN 1.6 1.4  ABG  Recent Labs Lab 12/13/14 1515 12/14/14 0600 12/14/14 1635  PHART 7.20* 7.37 7.35  PCO2ART 34 41 45  PO2ART 75* 69* 79*   Liver Enzymes  Recent Labs Lab 12/13/14 0752 12/13/14 1511  AST 32 29  ALT 16 15  ALKPHOS 105 92  BILITOT 0.9 0.9  ALBUMIN 3.0* 2.7*   Cardiac Enzymes No results for input(s): TROPONINI, PROBNP in the last 168 hours. Glucose  Recent Labs Lab 12/13/14 0807 12/13/14 1824 12/15/14 0655  GLUCAP 97 101*  99    Imaging No results found.  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   PULMONARY - Memory status currently stable, patient is protecting her airway, she is a nrespiratorytress, Maintain saturations greater than 90% Monitor neuro status  CARDIOVASCULAR -  new to wean down pressors as toleratedMonitor vital signs -Monitor blood pressure    RENAL A:  Acute renal failure Hyperkalemia Metabolic acidosis P:  -attempted A-line placement, but unsuccessful.  -Emergent dialysis for hyperkalemia- now stable -Nephrology following-appreciate recommendations -Monitor urine output-improving  GASTROINTESTINAL SUP  HEMATOLOGIC Anemia -Monitor CBC  INFECTIOUS Suspected urinary tract infection P:  - History of Escherichia coli in the past -Received 1 dose of ciprofloxacin and started on meropenem, stop date set for 5 days of abx.  -Follow-up cultures  MRSA PCR positive. Blood cultures 12/8, negative thus far. Urine cultures 12/8, negative thus far.   ENDOCRINE - ICU hypoglycemia/hyperglycemia protocol  NEUROLOGIC A:  Acute metabolic encephalopathy Acute mental status changes Severe agitation secondary to toxic metabolic encephalopathy P:  RASS goal: 0 - Monitor neuro status - By history no toxic ingestion, follow-up salicylate and acetaminophen levels, follow-up alcohol levels also -We will also check serum osmolality and continue to monitor - Father stated that he rechecked the patient's pill bottles, all pills are accounted for none are missing. -May need to  start a Precedex drip for severe agitation, we'll continue to monitor.  Wells Guileseep Chyan Carnero M.D. [facility] On Call Pager - [phone] (please enter 7-digits)   Note: This note was prepared with Dragon dictation along with smaller phrase technology. Any transcriptional errors that result from this process are unintentional.  Critical Care Attestation.  I have personally obtained a history, examined the patient, evaluated laboratory and imaging results, formulated the assessment and plan and placed orders. The Patient requires high complexity decision making for assessment and support, frequent evaluation and titration of therapies, application of advanced monitoring technologies and extensive interpretation of multiple databases. The patient has critical illness that could lead imminently to failure of 1 or more organ systems and requires the highest level of physician preparedness to intervene.  Critical Care Time devoted to patient care services described in this note is 35 minutes and is exclusive of time spent in procedures.

## 2014-12-15 NOTE — Progress Notes (Signed)
[facility]  ROUNDING NOTE   Subjective:   Critically ill. On norepinephrine gtt and vasopressin gtt agitated this AM Had to be sedated  UOP appears to improving S Cr 2.16 (2.85)  Objective:  Vital signs in last 24 hours:  Temp:  [97.8 F (36.6 C)-99.9 F (37.7 C)] 98.7 F (37.1 C) (12/10 0530) Pulse Rate:  [45-98] 50 (12/10 0900) Resp:  [16-27] 24 (12/10 0900) BP: (91-152)/(35-87) 151/55 mmHg (12/10 0900) SpO2:  [91 %-99 %] 92 % (12/10 0900)  Weight change:  Filed Weights   12/13/14 0751 12/13/14 1800  Weight: 99.791 kg (220 lb) 103.1 kg (227 lb 4.7 oz)    Intake/Output: I/O last 3 completed shifts: In: 2605 [I.V.:2405; IV Piggyback:200] Out: 3885 [Urine:4385]   Intake/Output this shift:     Physical Exam: General: Critically ill  Head: Normocephalic, atraumatic.    Eyes: Anicteric,   Neck: Supple, trachea midline  Lungs:  Coarse breath sounds  Heart: Regular rate and rhythm  Abdomen:  Soft, nontender, obese  Extremities: no peripheral edema.  Neurologic: Not oriented, sedated, not able to follow commands  Skin: No lesions  Access: Right femoral trialysis 12/8 Dr.Mungal    Basic Metabolic Panel:  Recent Labs Lab 12/13/14 1511 12/13/14 1839  12/14/14 1041 12/14/14 1357 12/14/14 1620 12/14/14 2204 12/15/14 0455  NA 137 139  < > 141 140 138 136 138  K >7.5* 7.4*  < > 5.2* 5.0 5.1 5.1 5.1  CL 107 106  < > 103 103 103 104 104  CO2 16* 19*  < > 19* 25  GLUCOSE 78 97  < > 160* 161* 146* 159* 137*  BUN 67* 68*  < > 51* 47* 43* 39* 36*  CREATININE 7.46*  7.70* 7.54*  < > 4.12* 3.76* 3.35* 2.85* 2.16*  CALCIUM 8.0* 7.8*  < > 8.3* 8.6* 8.4* 8.3* 8.4*  MG 2.3 2.1  --   --   --   --   --   --   PHOS  --  9.6*  --   --   --   --   --   --   < > = values in this interval not displayed.  Liver Function Tests:  Recent Labs Lab 12/13/14 0752 12/13/14 1511  AST 32 29  ALT 16 15  ALKPHOS 105 92  BILITOT 0.9 0.9  PROT 6.8 6.2*   ALBUMIN 3.0* 2.7*   No results for input(s): LIPASE, AMYLASE in the last 168 hours.  Recent Labs Lab 12/13/14 0830  AMMONIA 36*    CBC:  Recent Labs Lab 12/13/14 0752 12/13/14 1511 12/13/14 1839 12/14/14 0437  WBC 8.8 5.5 4.3 9.3  HGB 8.7* 7.8* 7.6* 9.1*  HCT 28.2* 25.1* 23.2* 27.4*  MCV 89.5 88.4 86.6 86.1  PLT 284 216 191 239    Cardiac Enzymes: No results for input(s): CKTOTAL, CKMB, CKMBINDEX, TROPONINI in the last 168 hours.  BNP: Invalid input(s): POCBNP  CBG:  Recent Labs Lab 12/13/14 0807 12/13/14 1824 12/15/14 0655  GLUCAP 97 101* 99    Microbiology: Results for orders placed or performed during the hospital encounter of 12/13/14  Urine culture     Status: None (Preliminary result)   Collection Time: 12/13/14  8:30 AM  Result Value Ref Range Status   Specimen Description URINE, CLEAN CATCH  Final   Special Requests NONE  Final   Culture TOO YOUNG TO READ  Final   Report Status PENDING  Incomplete  MRSA PCR Screening     Status: Abnormal   Collection Time: 12/13/14  3:00 PM  Result Value Ref Range Status   MRSA by PCR POSITIVE (A) NEGATIVE Final    Comment:        The GeneXpert MRSA Assay (FDA approved for NASAL specimens only), is one component of a comprehensive MRSA colonization surveillance program. It is not intended to diagnose MRSA infection nor to guide or monitor treatment for MRSA infections. CRITICAL RESULT CALLED TO, READ BACK BY AND VERIFIED WITH:  DALE HOPKINS AT 2117 12/13/14 SDR   Culture, blood (routine x 2)     Status: None (Preliminary result)   Collection Time: 12/13/14  5:25 PM  Result Value Ref Range Status   Specimen Description BLOOD LEFT WRIST  Final   Special Requests   Final    BOTTLES DRAWN AEROBIC AND ANAEROBIC  1CC AERO, 2CC ANAERO   Culture NO GROWTH 2 DAYS  Final   Report Status PENDING  Incomplete  Culture, blood (routine x 2)     Status: None (Preliminary result)   Collection Time: 12/13/14  6:39 PM   Result Value Ref Range Status   Specimen Description BLOOD RIGHT ARM  Final   Special Requests BOTTLES DRAWN AEROBIC AND ANAEROBIC 7CC  Final   Culture NO GROWTH 2 DAYS  Final   Report Status PENDING  Incomplete    Coagulation Studies: No results for input(s): LABPROT, INR in the last 72 hours.  Urinalysis:  Recent Labs  12/13/14 0830  COLORURINE AMBER*  LABSPEC 1.020  PHURINE 5.0  GLUCOSEU NEGATIVE  HGBUR NEGATIVE  BILIRUBINUR NEGATIVE  KETONESUR NEGATIVE  PROTEINUR 30*  NITRITE NEGATIVE  LEUKOCYTESUR 2+*      Imaging: Dg Chest 1 View  12/14/2014  CLINICAL DATA:  Acute onset of shortness of breath and hypotension. Initial encounter. EXAM: CHEST 1 VIEW COMPARISON:  Chest radiograph performed 12/13/2014 FINDINGS: The lungs are hypoexpanded. A small left pleural effusion is suspected. Mildly increased central lung markings could reflect minimal interstitial edema or possibly pneumonia. No pneumothorax is seen. The cardiomediastinal silhouette is borderline normal in size. No acute osseous abnormalities are seen. A left shoulder arthroplasty is incompletely imaged, but appears grossly unremarkable. IMPRESSION: Lungs hypoexpanded. Suspect small left pleural effusion. Mildly increased central lung markings may reflect minimal interstitial edema or possibly pneumonia. Electronically Signed   By: Roanna Raider M.D.   On: 12/14/2014 01:58   US Renal  12/13/2014  CLINICAL DATA:  49 year old female with acute renal failure. Initial encounter. EXAM: RENAL / URINARY TRACT ULTRASOUND COMPLETE COMPARISON:  10/11/2013. FINDINGS: Right Kidney: Evaluation limited by patient's habitus and patient unable to cooperate. Atrophic lobular appearance of the right kidney without hydronephrosis. 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 If further delineation of the right kidney is clinically desired, CT imaging (can be performed without contrast) may be considered. Left Kidney:  Length: 9.2 cm. Echogenicity within normal limits. No mass or hydronephrosis visualized. Bladder: Appears normal for degree of bladder distention. IMPRESSION: Exam limited.  Particularly limited evaluation of the right kidney. Right kidney appears atrophic and lobulated. Follow-up as noted above. No hydronephrosis detected. Electronically Signed   By: Lacy Duverney M.D.   On: 12/13/2014 11:02     Medications:   . dexmedetomidine 0.5 mcg/kg/hr (12/15/14 0752)  . norepinephrine (LEVOPHED) Adult infusion 12 mcg/min (12/15/14 0604)  . vasopressin (PITRESSIN) infusion - *FOR SHOCK* 0.03 Units/min (12/15/14 0200)   . Chlorhexidine Gluconate Cloth  6 each  Topical Q0600  . heparin  5,000 Units Subcutaneous 3 times per day  . insulin aspart  5 Units Intravenous Once  . meropenem (MERREM) IV  500 mg Intravenous Q12H  . midazolam  2 mg Intravenous Once  . mupirocin ointment  1 application Nasal BID  . sodium chloride  3 mL Intravenous Q12H  . tuberculin  5 Units Intradermal Once   acetaminophen **OR** acetaminophen, morphine injection, ziprasidone  Assessment/ Plan:  Ms. Lita MainsKendra A Haas is a 49 y.o. white female with chronic pain syndrome, schizophrenia, restless leg syndrome, irritable bowel syndrome, history of pulmonary embolism who [facility]   1. Acute renal failure on chronic kidney disease stage III: baseline creatinine of 1.16 from 12/07/14. 2. Hyperkalemia 3. Metabolic acidosis 4. Metabolic encephalopathy 5. Anemia  Nonoliguric urine output this morning. 3800 cc  Electrolytes and Volume status are acceptable No acute indication for Dialysis at present   LOS: 2 Brennen Gardiner 12/10/20169:27 AM

## 2014-12-15 NOTE — Progress Notes (Signed)
Called multiple time for patient agitated.  Code 300 called due to patient aggression.  Ativan IV given, then Geodon IV, subsequently had to start low dose precedex for patient and staff safety (pt pulling lines, aggressive, etc.  Kristeen MissWILLIS, Klover Priestly FIELDING [facility] 12/15/2014, 3:32 AM

## 2014-12-15 NOTE — Progress Notes (Signed)
[facility] - [place] at [facility]                                                                                                                                                                                            Patient Demographics   [name] Haas, is a 49 y.o. female, DOB - 09/04/1965, ZOX:[id number]  Admit date - 12/13/2014   Admitting Physician Auburn Bilberry, MD  Outpatient Primary MD for the patient is [name], Johnney Killian, MD   LOS - 2  Subjective: Started back on Percedex drip last night.  Due to pt agression and puling out lines, normal resting. Critically ill , on vasopressin, Levophed drips. Mother is at bedside. Urine output is improving. Creatinine is 2.16. Review of Systems:   CONSTITUTIONAL:confused  Vitals:   Filed Vitals:   12/15/14 0630 12/15/14 0700 12/15/14 0800 12/15/14 0900  BP: 130/61 91/35 137/54 151/55  Pulse: 51 62  50  Temp:      TempSrc:      Resp: 17 20 24 24   Height:      Weight:      SpO2: 96% 97%  92%    Wt Readings from Last 3 Encounters:  12/13/14 103.1 kg (227 lb 4.7 oz)  11/19/14 96.163 kg (212 lb)  06/07/14 100.336 kg (221 lb 3.2 oz)     Intake/Output Summary (Last 24 hours) at 12/15/14 1213 Last data filed at 12/15/14 0530  Gross per 24 hour  Intake 229.43 ml  Output   3415 ml  Net -3185.57 ml    Physical Exam:   GENERAL: Critically ill-appearing female HEAD, EYES, EARS, NOSE AND THROAT: Atraumatic, normocephalic. Extraocular muscles are intact. Pupils equal and reactive to light. Sclerae anicteric. No conjunctival injection. No oro-pharyngeal erythema.  NECK: Supple. There is no jugular venous distention. No bruits, no lymphadenopathy, no thyromegaly.  HEART: Regular rate and rhythm,. No murmurs, no rubs, no clicks.  LUNGS: Clear to auscultation bilaterally. No rales or rhonchi. No wheezes.  ABDOMEN: Soft, flat, nontender, nondistended. Has good bowel sounds. No hepatosplenomegaly appreciated.   EXTREMITIES: No evidence of any cyanosis, clubbing, or peripheral edema.  +2 pedal and radial pulses bilaterally.  Neuro;sedated. coronary nerves II-12 grossly intact strength 5 over 5 in all 4 extremities  SKIN: Moist and warm with no rashes appreciated.  Psych: Not anxious, depressed LN: No inguinal LN enlargement    Antibiotics   Anti-infectives    Start     Dose/Rate Route Frequency Ordered Stop   12/13/14 1445  meropenem (MERREM) 500 mg in sodium chloride 0.9 % 50 mL IVPB     500  mg 100 mL/hr over 30 Minutes Intravenous Every 12 hours 12/13/14 1438 12/18/14 0959   12/13/14 1400  meropenem (MERREM) 1 g in sodium chloride 0.9 % 100 mL IVPB  Status:  Discontinued     1 g 200 mL/hr over 30 Minutes Intravenous 3 times per day 12/13/14 1002 12/13/14 1437   12/13/14 0930  ciprofloxacin (CIPRO) IVPB 400 mg     400 mg 200 mL/hr over 60 Minutes Intravenous  Once 12/13/14 0923 12/13/14 1040      Medications   Scheduled Meds: . Chlorhexidine Gluconate Cloth  6 each Topical Q0600  . heparin  5,000 Units Subcutaneous 3 times per day  . insulin aspart  5 Units Intravenous Once  . meropenem (MERREM) IV  500 mg Intravenous Q12H  . midazolam  2 mg Intravenous Once  . mupirocin ointment  1 application Nasal BID  . sodium chloride  3 mL Intravenous Q12H  . tuberculin  5 Units Intradermal Once   Continuous Infusions: . dexmedetomidine 0.5 mcg/kg/hr (12/15/14 0900)  . norepinephrine (LEVOPHED) Adult infusion 8 mcg/min (12/15/14 0900)  . vasopressin (PITRESSIN) infusion - *FOR SHOCK* 0.03 Units/min (12/15/14 0900)   PRN Meds:.acetaminophen **OR** acetaminophen, morphine injection, ziprasidone   Data Review:   Micro Results Recent Results (from the past 240 hour(s))  Urine culture     Status: None (Preliminary result)   Collection Time: 12/13/14  8:30 AM  Result Value Ref Range Status   Specimen Description URINE, CLEAN CATCH  Final   Special Requests NONE  Final   Culture HOLDING  FOR POSSIBLE PATHOGEN  Final   Report Status PENDING  Incomplete  MRSA PCR Screening     Status: Abnormal   Collection Time: 12/13/14  3:00 PM  Result Value Ref Range Status   MRSA by PCR POSITIVE (A) NEGATIVE Final    Comment:        The GeneXpert MRSA Assay (FDA approved for NASAL specimens only), is one component of a comprehensive MRSA colonization surveillance program. It is not intended to diagnose MRSA infection nor to guide or monitor treatment for MRSA infections. CRITICAL RESULT CALLED TO, READ BACK BY AND VERIFIED WITH:  DALE HOPKINS AT 2117 12/13/14 SDR   Culture, blood (routine x 2)     Status: None (Preliminary result)   Collection Time: 12/13/14  5:25 PM  Result Value Ref Range Status   Specimen Description BLOOD LEFT WRIST  Final   Special Requests   Final    BOTTLES DRAWN AEROBIC AND ANAEROBIC  1CC AERO, 2CC ANAERO   Culture NO GROWTH 2 DAYS  Final   Report Status PENDING  Incomplete  Culture, blood (routine x 2)     Status: None (Preliminary result)   Collection Time: 12/13/14  6:39 PM  Result Value Ref Range Status   Specimen Description BLOOD RIGHT ARM  Final   Special Requests BOTTLES DRAWN AEROBIC AND ANAEROBIC 7CC  Final   Culture NO GROWTH 2 DAYS  Final   Report Status PENDING  Incomplete    Radiology Reports Dg Chest 1 View  12/14/2014  CLINICAL DATA:  Acute onset of shortness of breath and hypotension. Initial encounter. EXAM: CHEST 1 VIEW COMPARISON:  Chest radiograph performed 12/13/2014 FINDINGS: The lungs are hypoexpanded. A small left pleural effusion is suspected. Mildly increased central lung markings could reflect minimal interstitial edema or possibly pneumonia. No pneumothorax is seen. The cardiomediastinal silhouette is borderline normal in size. No acute osseous abnormalities are seen. A left shoulder arthroplasty is  incompletely imaged, but appears grossly unremarkable. IMPRESSION: Lungs hypoexpanded. Suspect small left pleural effusion.  Mildly increased central lung markings may reflect minimal interstitial edema or possibly pneumonia. Electronically Signed   By: Roanna RaiderJeffery  Chang M.D.   On: 12/14/2014 01:58   Dg Chest 2 View  12/13/2014  CLINICAL DATA:  Altered mental status, pale, diaphoretic. EXAM: CHEST  2 VIEW COMPARISON:  Chest x-ray of October 09, 2013 FINDINGS: The lungs are hypoinflated. There is crowding of the pulmonary vascularity. The cardiac silhouette is enlarged. The pulmonary vascularity is indistinct. There is no pleural effusion. 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 IMPRESSION: Bilateral pulmonary hypoinflation. Stable enlargement cardiac silhouette. No definite pulmonary edema or pneumonia allowing for vascular crowding. Electronically Signed   By: David  SwazilandJordan M.D.   On: 12/13/2014 09:17   Ct Head Wo Contrast  12/13/2014  CLINICAL DATA:  Altered mental status. EXAM: CT HEAD WITHOUT CONTRAST TECHNIQUE: Contiguous axial images were obtained from the base of the skull through the vertex without intravenous contrast. COMPARISON:  10/08/2013 FINDINGS: Skull and Sinuses:Negative for fracture or destructive process. The visualized mastoids, middle ears, and imaged paranasal sinuses are clear. Orbits: Scleral band on the right.  No acute finding. Brain: No evidence of acute infarction, hemorrhage, hydrocephalus, or mass lesion/mass effect. IMPRESSION: Negative head CT. Electronically Signed   By: Marnee SpringJonathon  Watts M.D.   On: 12/13/2014 08:33   [place] Renal  12/13/2014  CLINICAL DATA:  49 year old female with acute renal failure. Initial encounter. EXAM: RENAL / URINARY TRACT ULTRASOUND COMPLETE COMPARISON:  10/11/2013. FINDINGS: Right Kidney: Evaluation limited by patient's habitus and patient unable to cooperate. Atrophic lobular appearance of the right kidney without hydronephrosis. 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 If further delineation of the right kidney is clinically desired, CT imaging (can be  performed without contrast) may be considered. Left Kidney: Length: 9.2 cm. Echogenicity within normal limits. No mass or hydronephrosis visualized. Bladder: Appears normal for degree of bladder distention. IMPRESSION: Exam limited.  Particularly limited evaluation of the right kidney. Right kidney appears atrophic and lobulated. Follow-up as noted above. No hydronephrosis detected. Electronically Signed   By: Lacy DuverneySteven  Olson M.D.   On: 12/13/2014 11:02     CBC  Recent Labs Lab 12/13/14 0752 12/13/14 1511 12/13/14 1839 12/14/14 0437  WBC 8.8 5.5 4.3 9.3  HGB 8.7* 7.8* 7.6* 9.1*  HCT 28.2* 25.1* 23.2* 27.4*  PLT 284 216 191 239  MCV 89.5 88.4 86.6 86.1  MCH 27.6 27.5 28.3 28.6  MCHC 30.8* 31.2* 32.6 33.3  RDW 17.6* 17.3* 17.1* 17.3*    Chemistries   Recent Labs Lab 12/13/14 0752 12/13/14 1511 12/13/14 1839  12/14/14 1041 12/14/14 1357 12/14/14 1620 12/14/14 2204 12/15/14 0455  NA 136 137 139  < > 141 140 138 136 138  K 6.6* >7.5* 7.4*  < > 5.2* 5.0 5.1 5.1 5.1  CL 104 107 106  < > 103 103 103 104 104  CO2 13* 16* 19*  < > 24 24 22  19* 25  GLUCOSE 103* 78 97  < > 160* 161* 146* 159* 137*  BUN 63* 67* 68*  < > 51* 47* 43* 39* 36*  CREATININE 7.53* 7.46*  7.70* 7.54*  < > 4.12* 3.76* 3.35* 2.85* 2.16*  CALCIUM 8.3* 8.0* 7.8*  < > 8.3* 8.6* 8.4* 8.3* 8.4*  MG  --  2.3 2.1  --   --   --   --   --   --  AST 32 29  --   --   --   --   --   --   --   ALT 16 15  --   --   --   --   --   --   --   ALKPHOS 105 92  --   --   --   --   --   --   --   BILITOT 0.9 0.9  --   --   --   --   --   --   --   < > = values in this interval not displayed. ------------------------------------------------------------------------------------------------------------------ estimated creatinine clearance is 37.4 mL/min (by C-G formula based on Cr of 2.16). ------------------------------------------------------------------------------------------------------------------  Recent Labs   12/13/14 1511  HGBA1C 4.6   ------------------------------------------------------------------------------------------------------------------ No results for input(s): CHOL, HDL, LDLCALC, TRIG, CHOLHDL, LDLDIRECT in the last 72 hours. ------------------------------------------------------------------------------------------------------------------  Recent Labs  12/13/14 1511  TSH 2.906   ------------------------------------------------------------------------------------------------------------------ No results for input(s): VITAMINB12, FOLATE, FERRITIN, TIBC, IRON, RETICCTPCT in the last 72 hours.  Coagulation profile No results for input(s): INR, PROTIME in the last 168 hours.  No results for input(s): DDIMER in the last 72 hours.  Cardiac Enzymes No results for input(s): CKMB, TROPONINI, MYOGLOBIN in the last 168 hours.  Invalid input(s): CK ------------------------------------------------------------------------------------------------------------------ Invalid input(s): POCBNP    Assessment & Plan   IMPRESSION AND PLAN: Patient is a 49 year old white female brought in by family due to confusion 1. Acute encephalopathy: The combination of acute renal failure and ATN.   Restarted percedex drip;will consider psych consult for her bipolar disroder  2. Hypotension could be related to urinary tract infection and sepsis.[ Continue on vasopressin and Levophed 3. Acute renal failure with severe hyperkalemia;, metabolic acidosis with metabolic encephalopathy: Current patient received bicarbonate drip. It is post emergency hemodialysis. Potassium is better, renal function is better today. 4.suspected  Urinary tract infection:continue abx. 6.  Bipolar disorder hold all her psychiatric medications  Condition l critical. high risk for cardiac arrest.     Code Status Orders        Start     Ordered   12/13/14 1510  Full code   Continuous     12/13/14 1509            Consults  nephrologlo  DVT Prophylaxis heparin  Lab Results  Component Value Date   PLT 239 12/14/2014     Time Spent in minutes  crtical care   Clemente Dewey M.D on 12/15/2014 at 12:13 PM  Between 7am to 6pm - Pager - [phone]  After 6pm go to [email or web address] - password [email or web address] [facility]  [facility]   Office  [phone]

## 2014-12-15 NOTE — Progress Notes (Signed)
Speech Therapy Note: received order, reviewed chart notes and consulted NSG. Attempted to see pt x2 today. Pt was given meds for agitation and is lethargic. She would be at increased risk attempting po's at this time. Rec. NPO status and consulting MD re: the necessary meds. ST will f/u w/ BSE. NSG agreed.

## 2014-12-15 NOTE — Progress Notes (Signed)
Patient SR/SB on cardiac monitor, RA, SATs WNL. Titrated off Levophed and Vasopressin. Precedex titrated for agitation, currently at .5 mcg, infusing through right femoral trialysis catheter, Code 300 called x 1 this a.m. Foley catheter with adequate UOP. Father at bedside today, updated on patient status.

## 2014-12-15 NOTE — Progress Notes (Signed)
ANTIBIOTIC CONSULT NOTE - INITIAL  Pharmacy Consult for meropenem Indication: UTI  Allergies  Allergen Reactions  . Alcohol Swelling    overall swelling  . Bextra [Valdecoxib]   . Celebrex [Celecoxib]   . Penicillins     Angioedema & rash  . Sulfa Antibiotics   . Sulfacetamide Sodium   . Vioxx [Rofecoxib]   . Metformin Rash    Sores, ulcers    Patient Measurements: Height: 5\' 6"  (167.6 cm) Weight: 227 lb 4.7 oz (103.1 kg) IBW/kg (Calculated) : 59.3 Adjusted Body Weight:   Vital Signs: Temp: 98.7 F (37.1 C) (12/10 0530) Temp Source: Axillary (12/10 0530) BP: 151/55 mmHg (12/10 0900) Pulse Rate: 50 (12/10 0900) Intake/Output from previous day: 12/09 0701 - 12/10 0700 In: 641.8 [I.V.:591.8; IV Piggyback:50] Out: 3835 [Urine:3835]   Recent Labs  12/13/14 1511 12/13/14 1839  12/14/14 0437  12/14/14 1620 12/14/14 2204 12/15/14 0455  WBC 5.5 4.3  --  9.3  --   --   --   --   HGB 7.8* 7.6*  --  9.1*  --   --   --   --   PLT 216 191  --  239  --   --   --   --   CREATININE 7.46*  7.70* 7.54*  < > 4.74*  < > 3.35* 2.85* 2.16*  < > = values in this interval not displayed. Estimated Creatinine Clearance: 37.4 mL/min (by C-G formula based on Cr of 2.16).  Microbiology: Recent Results (from the past 720 hour(s))  Urine culture     Status: None (Preliminary result)   Collection Time: 12/13/14  8:30 AM  Result Value Ref Range Status   Specimen Description URINE, CLEAN CATCH  Final   Special Requests NONE  Final   Culture TOO YOUNG TO READ  Final   Report Status PENDING  Incomplete  MRSA PCR Screening     Status: Abnormal   Collection Time: 12/13/14  3:00 PM  Result Value Ref Range Status   MRSA by PCR POSITIVE (A) NEGATIVE Final    Comment:        The GeneXpert MRSA Assay (FDA approved for NASAL specimens only), is one component of a comprehensive MRSA colonization surveillance program. It is not intended to diagnose MRSA infection nor to guide or monitor  treatment for MRSA infections. CRITICAL RESULT CALLED TO, READ BACK BY AND VERIFIED WITH:  DALE HOPKINS AT 2117 12/13/14 SDR   Culture, blood (routine x 2)     Status: None (Preliminary result)   Collection Time: 12/13/14  5:25 PM  Result Value Ref Range Status   Specimen Description BLOOD LEFT WRIST  Final   Special Requests   Final    BOTTLES DRAWN AEROBIC AND ANAEROBIC  1CC AERO, 2CC ANAERO   Culture NO GROWTH 2 DAYS  Final   Report Status PENDING  Incomplete  Culture, blood (routine x 2)     Status: None (Preliminary result)   Collection Time: 12/13/14  6:39 PM  Result Value Ref Range Status   Specimen Description BLOOD RIGHT ARM  Final   Special Requests BOTTLES DRAWN AEROBIC AND ANAEROBIC 7CC  Final   Culture NO GROWTH 2 DAYS  Final   Report Status PENDING  Incomplete    Medical History: Past Medical History  Diagnosis Date  . Other chronic pain   . Restless legs syndrome (RLS)   . Disorder of bone and cartilage, unspecified   . Unspecified essential hypertension   .  Esophageal reflux   . Personal history of malignant neoplasm of cervix uteri   . Irritable bowel syndrome   . Contact dermatitis and other eczema, due to unspecified cause   . Lumbago   . Migraine, unspecified, without mention of intractable migraine without mention of status migrainosus   . Unspecified schizophrenia, unspecified condition   . Personal history of colonic polyps   . Symptomatic menopausal or female climacteric states   . Other and unspecified hyperlipidemia   . Pain in joint, site unspecified   . Hypertonicity of bladder   . Pain in joint, forearm   . History of renal failure and respiratory acidosis (05/24/2014) 11/20/2014    Medications:  Scheduled:  . Chlorhexidine Gluconate Cloth  6 each Topical Q0600  . heparin  5,000 Units Subcutaneous 3 times per day  . insulin aspart  5 Units Intravenous Once  . meropenem (MERREM) IV  500 mg Intravenous Q12H  . midazolam  2 mg Intravenous  Once  . mupirocin ointment  1 application Nasal BID  . sodium chloride  3 mL Intravenous Q12H  . tuberculin  5 Units Intradermal Once   Assessment: Pt is a 49 year old female in acute renal failure with a possible UTI. Pt has a penicillin allergy (angioedema/rash). Pt ordered meropenem, pharmacy consulted for dosing.  Plan:  Urine cultures still pending. Will continue meropenem  q 12 hours. Pharmacy to monitor changes in renal function requiring dose adjustments.  Cher Nakai, PharmD Pharmacy Resident  12/15/2014,9:38 AM

## 2014-12-15 NOTE — Progress Notes (Signed)
Pt became very agitated and combative, uncooperative.  Pt ripping all medical equipment off, attempting to rip out right femoral trialysis catheter.  RN trying to reason and talk with pt to place medical equipment back on, and that she will bleed severely if she pulls out trialysis catheter. Per pt "I don't care if I bleed out, I'm going home."  Pt trying to hit and kick staff.  CODE 300 called for pt safety.

## 2014-12-16 LAB — PHOSPHORUS: PHOSPHORUS: 2.9 mg/dL (ref 2.5–4.6)

## 2014-12-16 LAB — CBC
HEMATOCRIT: 23.5 % — AB (ref 35.0–47.0)
HEMOGLOBIN: 7.8 g/dL — AB (ref 12.0–16.0)
MCH: 28.6 pg (ref 26.0–34.0)
MCHC: 33.2 g/dL (ref 32.0–36.0)
MCV: 86.2 fL (ref 80.0–100.0)
Platelets: 136 10*3/uL — ABNORMAL LOW (ref 150–440)
RBC: 2.73 MIL/uL — AB (ref 3.80–5.20)
RDW: 15.9 % — ABNORMAL HIGH (ref 11.5–14.5)
WBC: 3.7 10*3/uL (ref 3.6–11.0)

## 2014-12-16 LAB — BASIC METABOLIC PANEL
Anion gap: 7 (ref 5–15)
BUN: 27 mg/dL — AB (ref 6–20)
CHLORIDE: 109 mmol/L (ref 101–111)
CO2: 26 mmol/L (ref 22–32)
CREATININE: 1.37 mg/dL — AB (ref 0.44–1.00)
Calcium: 8.4 mg/dL — ABNORMAL LOW (ref 8.9–10.3)
GFR calc non Af Amer: 44 mL/min — ABNORMAL LOW (ref >60)
GFR, EST AFRICAN AMERICAN: 51 mL/min — AB (ref >60)
Glucose, Bld: 96 mg/dL (ref 65–99)
POTASSIUM: 4.4 mmol/L (ref 3.5–5.1)
Sodium: 142 mmol/L (ref 135–145)

## 2014-12-16 LAB — MAGNESIUM: MAGNESIUM: 1.9 mg/dL (ref 1.7–2.4)

## 2014-12-16 MED ORDER — RISPERIDONE 1 MG PO TBDP
1.0000 mg | ORAL_TABLET | Freq: Every day | ORAL | Status: DC
  Administered 2014-12-16 – 2014-12-22 (×7): 1 mg via ORAL
  Filled 2014-12-16 (×9): qty 1

## 2014-12-16 MED ORDER — ONDANSETRON HCL 4 MG/2ML IJ SOLN
4.0000 mg | INTRAMUSCULAR | Status: DC | PRN
Start: 2014-12-16 — End: 2014-12-23
  Administered 2014-12-16 – 2014-12-20 (×6): 4 mg via INTRAVENOUS
  Filled 2014-12-16 (×5): qty 2

## 2014-12-16 MED ORDER — SODIUM CHLORIDE 0.9 % IV SOLN
500.0000 mg | Freq: Three times a day (TID) | INTRAVENOUS | Status: DC
  Administered 2014-12-16 – 2014-12-19 (×8): 500 mg via INTRAVENOUS
  Filled 2014-12-16 (×13): qty 0.5

## 2014-12-16 MED ORDER — HALOPERIDOL LACTATE 2 MG/ML PO CONC
2.0000 mg | Freq: Four times a day (QID) | ORAL | Status: DC | PRN
Start: 2014-12-16 — End: 2014-12-23
  Filled 2014-12-16: qty 1

## 2014-12-16 NOTE — Progress Notes (Signed)
Precedex discontinued. She continues to be calm. She is receptive to nurse explanation of why she cannot eat and limited cups of ice. Will continue to monitor.

## 2014-12-16 NOTE — Progress Notes (Signed)
[facility] [place] [facility]                                                                                                                                                                                            Patient Demographics   [name] FossaKendra Haas, is a 49 y.o. female, DOB - [birth year], WGN:[id number]  Admit date - 12/13/2014   Admitting Physician Auburn BilberryShreyang Patel, MD  Outpatient Primary MD for the patient is [name], [name], MD   LOS - 3  Subjective: off percedex drip  Since this morning. Patient is awake alert oriented and cooperating with the nurses. Off the pressors. Cr  is down to 1.37. Review of Systems:   CONSTITUTIONAL:not  confused anymore.  Review of systems:  Awake alert oriented Cardiovascular; no chest pain  Lungs; no cough, shortness of breath. GI: No abdominal pain, no nausea, no vomiting. Neurological: No headache, no double vision, no weakness in the hands or legs. Psych ;patient right now is not agitated.  Vitals:   Filed Vitals:   12/16/14 0747 12/16/14 0800 12/16/14 0900 12/16/14 1000  BP:  129/66 113/87 120/66  Pulse:  74 70 78  Temp: 98.2 F (36.8 C)     TempSrc: Axillary     Resp:  16 19 17   Height:      Weight:      SpO2:  98% 100% 95%    Wt Readings from Last 3 Encounters:  12/13/14 103.1 kg (227 lb 4.7 oz)  11/19/14 96.163 kg (212 lb)  06/07/14 100.336 kg (221 lb 3.2 oz)     Intake/Output Summary (Last 24 hours) at 12/16/14 1044 Last data filed at 12/16/14 0800  Gross per 24 hour  Intake 528.77 ml  Output   1330 ml  Net -801.23 ml    Physical Exam:   GENERAL: Critically ill-appearing female HEAD, EYES, EARS, NOSE AND THROAT: Atraumatic, normocephalic. Extraocular muscles are intact. Pupils equal and reactive to light. Sclerae anicteric. No conjunctival injection. No oro-pharyngeal erythema.  NECK: Supple. There is no jugular venous distention. No bruits, no lymphadenopathy, no thyromegaly.  HEART:  Regular rate and rhythm,. No murmurs, no rubs, no clicks.  LUNGS: Clear to auscultation bilaterally. No rales or rhonchi. No wheezes.  ABDOMEN: Soft, flat, nontender, nondistended. Has good bowel sounds. No hepatosplenomegaly appreciated.  EXTREMITIES: No evidence of any cyanosis, clubbing, or peripheral edema.  +2 pedal and radial pulses bilaterally.  Neuro; cranial nerves II-12 grossly intact strength 5 over 5 in all 4 extremities  SKIN: Moist and warm with no rashes appreciated.  Psych: Not anxious, depressed LN: No inguinal LN  enlargement    Antibiotics   Anti-infectives    Start     Dose/Rate Route Frequency Ordered Stop   12/16/14 1500  meropenem (MERREM) 500 mg in sodium chloride 0.9 % 50 mL IVPB     500 mg 100 mL/hr over 30 Minutes Intravenous 3 times per day 12/16/14 1007     12/13/14 1445  meropenem (MERREM) 500 mg in sodium chloride 0.9 % 50 mL IVPB  Status:  Discontinued     500 mg 100 mL/hr over 30 Minutes Intravenous Every 12 hours 12/13/14 1438 12/16/14 1007   12/13/14 1400  meropenem (MERREM) 1 g in sodium chloride 0.9 % 100 mL IVPB  Status:  Discontinued     1 g 200 mL/hr over 30 Minutes Intravenous 3 times per day 12/13/14 1002 12/13/14 1437   12/13/14 0930  ciprofloxacin (CIPRO) IVPB 400 mg     400 mg 200 mL/hr over 60 Minutes Intravenous  Once 12/13/14 0923 12/13/14 1040      Medications   Scheduled Meds: . Chlorhexidine Gluconate Cloth  6 each Topical Q0600  . heparin  5,000 Units Subcutaneous 3 times per day  . insulin aspart  5 Units Intravenous Once  . meropenem (MERREM) IV  500 mg Intravenous 3 times per day  . midazolam  2 mg Intravenous Once  . mupirocin ointment  1 application Nasal BID  . sodium chloride  3 mL Intravenous Q12H  . tuberculin  5 Units Intradermal Once   Continuous Infusions: . dexmedetomidine Stopped (12/16/14 0928)  . norepinephrine (LEVOPHED) Adult infusion Stopped (12/15/14 1200)  . vasopressin (PITRESSIN) infusion - *FOR  SHOCK* Stopped (12/15/14 1300)   PRN Meds:.acetaminophen **OR** acetaminophen, morphine injection, ziprasidone   Data Review:   Micro Results Recent Results (from the past 240 hour(s))  Urine culture     Status: None (Preliminary result)   Collection Time: 12/13/14  8:30 AM  Result Value Ref Range Status   Specimen Description URINE, CLEAN CATCH  Final   Special Requests NONE  Final   Culture   Final    >=100,000 COLONIES/mL GRAM POSITIVE COCCI IDENTIFICATION AND SUSCEPTIBILITIES TO FOLLOW    Report Status PENDING  Incomplete  MRSA PCR Screening     Status: Abnormal   Collection Time: 12/13/14  3:00 PM  Result Value Ref Range Status   MRSA by PCR POSITIVE (A) NEGATIVE Final    Comment:        The GeneXpert MRSA Assay (FDA approved for NASAL specimens only), is one component of a comprehensive MRSA colonization surveillance program. It is not intended to diagnose MRSA infection nor to guide or monitor treatment for MRSA infections. CRITICAL RESULT CALLED TO, READ BACK BY AND VERIFIED WITH:  DALE HOPKINS AT 2117 12/13/14 SDR   Culture, blood (routine x 2)     Status: None (Preliminary result)   Collection Time: 12/13/14  5:25 PM  Result Value Ref Range Status   Specimen Description BLOOD LEFT WRIST  Final   Special Requests   Final    BOTTLES DRAWN AEROBIC AND ANAEROBIC  1CC AERO, 2CC ANAERO   Culture NO GROWTH 3 DAYS  Final   Report Status PENDING  Incomplete  Culture, blood (routine x 2)     Status: None (Preliminary result)   Collection Time: 12/13/14  6:39 PM  Result Value Ref Range Status   Specimen Description BLOOD RIGHT ARM  Final   Special Requests BOTTLES DRAWN AEROBIC AND ANAEROBIC 7CC  Final   Culture NO  GROWTH 3 DAYS  Final   Report Status PENDING  Incomplete    Radiology Reports Dg Chest 1 View  12/14/2014  CLINICAL DATA:  Acute onset of shortness of breath and hypotension. Initial encounter. EXAM: CHEST 1 VIEW COMPARISON:  Chest radiograph performed  12/13/2014 FINDINGS: The lungs are hypoexpanded. A small left pleural effusion is suspected. Mildly increased central lung markings could reflect minimal interstitial edema or possibly pneumonia. No pneumothorax is seen. The cardiomediastinal silhouette is borderline normal in size. No acute osseous abnormalities are seen. A left shoulder arthroplasty is incompletely imaged, but appears grossly unremarkable. IMPRESSION: Lungs hypoexpanded. Suspect small left pleural effusion. Mildly increased central lung markings may reflect minimal interstitial edema or possibly pneumonia. Electronically Signed   By: Roanna Raider M.D.   On: 12/14/2014 01:58   Dg Chest 2 View  12/13/2014  CLINICAL DATA:  Altered mental status, pale, diaphoretic. EXAM: CHEST  2 VIEW COMPARISON:  Chest x-ray of October 09, 2013 FINDINGS: The lungs are hypoinflated. There is crowding of the pulmonary vascularity. The cardiac silhouette is enlarged. The pulmonary vascularity is indistinct. There is no pleural effusion. 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 IMPRESSION: Bilateral pulmonary hypoinflation. Stable enlargement cardiac silhouette. No definite pulmonary edema or pneumonia allowing for vascular crowding. Electronically Signed   By: David  [place] M.D.   On: 12/13/2014 09:17   Ct Head Wo Contrast  12/13/2014  CLINICAL DATA:  Altered mental status. EXAM: CT HEAD WITHOUT CONTRAST TECHNIQUE: Contiguous axial images were obtained from the base of the skull through the vertex without intravenous contrast. COMPARISON:  10/08/2013 FINDINGS: Skull and Sinuses:Negative for fracture or destructive process. The visualized mastoids, middle ears, and imaged paranasal sinuses are clear. Orbits: Scleral band on the right.  No acute finding. Brain: No evidence of acute infarction, hemorrhage, hydrocephalus, or mass lesion/mass effect. IMPRESSION: Negative head CT. Electronically Signed   By: Marnee Spring M.D.   On: 12/13/2014 08:33   US  Renal  12/13/2014  CLINICAL DATA:  49 year old female with acute renal failure. Initial encounter. EXAM: RENAL / URINARY TRACT ULTRASOUND COMPLETE COMPARISON:  10/11/2013. FINDINGS: Right Kidney: Evaluation limited by patient's habitus and patient unable to cooperate. Atrophic lobular appearance of the right kidney without hydronephrosis. 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 If further delineation of the right kidney is clinically desired, CT imaging (can be performed without contrast) may be considered. Left Kidney: Length: 9.2 cm. Echogenicity within normal limits. No mass or hydronephrosis visualized. Bladder: Appears normal for degree of bladder distention. IMPRESSION: Exam limited.  Particularly limited evaluation of the right kidney. Right kidney appears atrophic and lobulated. Follow-up as noted above. No hydronephrosis detected. Electronically Signed   By: Lacy Duverney M.D.   On: 12/13/2014 11:02     CBC  Recent Labs Lab 12/13/14 0752 12/13/14 1511 12/13/14 1839 12/14/14 0437 12/16/14 0455  WBC 8.8 5.5 4.3 9.3 3.7  HGB 8.7* 7.8* 7.6* 9.1* 7.8*  HCT 28.2* 25.1* 23.2* 27.4* 23.5*  PLT 284 216 191 239 136*  MCV 89.5 88.4 86.6 86.1 86.2  MCH 27.6 27.5 28.3 28.6 28.6  MCHC 30.8* 31.2* 32.6 33.3 33.2  RDW 17.6* 17.3* 17.1* 17.3* 15.9*    Chemistries   Recent Labs Lab 12/13/14 0752 12/13/14 1511 12/13/14 1839  12/14/14 1620 12/14/14 2204 12/15/14 0455 12/15/14 1311 12/16/14 0455  NA 136 137 139  < > 138 136 138 139 142  K 6.6* >7.5* 7.4*  < > 5.1 5.1 5.1 4.5 4.4  CL 104 107 106  < > 103 104 104 107 109  CO2 13* 16* 19*  < > 22 19* GLUCOSE 103* 78 97  < > 146* 159* 137* 115* 96  BUN 63* 67* 68*  < > 43* 39* 36* 32* 27*  CREATININE 7.53* 7.46*  7.70* 7.54*  < > 3.35* 2.85* 2.16* 1.79* 1.37*  CALCIUM 8.3* 8.0* 7.8*  < > 8.4* 8.3* 8.4* 8.4* 8.4*  MG  --  2.3 2.1  --   --   --   --   --  1.9  AST 32 29  --   --   --   --   --  24  --    ALT 16 15  --   --   --   --   --  15  --   ALKPHOS 105 92  --   --   --   --   --  71  --   BILITOT 0.9 0.9  --   --   --   --   --  0.8  --   < > = values in this interval not displayed. ------------------------------------------------------------------------------------------------------------------ estimated creatinine clearance is 58.9 mL/min (by C-G formula based on Cr of 1.37). ------------------------------------------------------------------------------------------------------------------  Recent Labs  12/13/14 1511  HGBA1C 4.6   ------------------------------------------------------------------------------------------------------------------ No results for input(s): CHOL, HDL, LDLCALC, TRIG, CHOLHDL, LDLDIRECT in the last 72 hours. ------------------------------------------------------------------------------------------------------------------  Recent Labs  12/13/14 1511  TSH 2.906   ------------------------------------------------------------------------------------------------------------------ No results for input(s): VITAMINB12, FOLATE, FERRITIN, TIBC, IRON, RETICCTPCT in the last 72 hours.  Coagulation profile No results for input(s): INR, PROTIME in the last 168 hours.  No results for input(s): DDIMER in the last 72 hours.  Cardiac Enzymes No results for input(s): CKMB, TROPONINI, MYOGLOBIN in the last 168 hours.  Invalid input(s): CK ------------------------------------------------------------------------------------------------------------------ Invalid input(s): POCBNP    Assessment & Plan   IMPRESSION AND PLAN: Patient is a 49 year old white female brought in by family due to confusion 1. Acute encephalopathy: The combination of acute renal failure and ATN.   Encephalopathy resolved. Off percedex drip since morning. Watch closely for further agitation. Possibly start the diet is stable. 2. Hypotension could be related to urinary tract infection  and sepsis.[Resolved. Off the pressors. 3. Acute renal failure with severe hyperkalemia;, metabolic acidosis with metabolic encephalopathy:patient received bicarbonate drip. It is post emergency hemodialysis. Renal function improved with good urine output. Potassium is better, renal function is better today. 4.suspected  Urinary tract infection:continue abx. 6.  Bipolar disorder hold all her psychiatric medications; psych consult today. Possibly need to restart psych medications including seroquel/ Lexapro. Denies taking the extra pills off her morphine, oxycodone, Zanaflex, Seroquel, Lexapro. She denies history of falls. So unclear at this reason at this time why she had acute renal failure      Code Status Orders        Start     Ordered   12/13/14 1510  Full code   Continuous     12/13/14 1509           Consults  nephrologlo  DVT Prophylaxis heparin  Lab Results  Component Value Date   PLT 136* 12/16/2014     Time Spent in minutes  crtical care   Chrles Selley M.D on 12/16/2014 at 10:44 AM  Between 7am to 6pm - Pager - [phone]  After 6pm go to [email or web address] - password [email or web address]  [facility]   Office  [phone]   

## 2014-12-16 NOTE — Progress Notes (Signed)
Pt c/o nausea, no vomiting. MD notified orders taken.

## 2014-12-16 NOTE — Progress Notes (Signed)
Dr. Darlin Priestlyamachadran in to see patient and Precedex off. Will monitor and document behaviors

## 2014-12-16 NOTE — Progress Notes (Signed)
ANTIBIOTIC CONSULT NOTE - INITIAL  Pharmacy Consult for meropenem Indication: UTI  Allergies  Allergen Reactions  . Alcohol Swelling    overall swelling  . Bextra [Valdecoxib]   . Celebrex [Celecoxib]   . Penicillins     Angioedema & rash  . Sulfa Antibiotics   . Sulfacetamide Sodium   . Vioxx [Rofecoxib]   . Metformin Rash    Sores, ulcers    Patient Measurements: Height:  (167.6 cm) Weight: 227 lb 4.7 oz (103.1 kg) IBW/kg (Calculated) : 59.3  Vital Signs: Temp: 98.2 F (36.8 C) (12/11 0747) Temp Source: Axillary (12/11 0747) BP: 99/65 mmHg (12/11 0600) Pulse Rate: 71 (12/11 0600) Intake/Output from previous day: 12/10 0701 - 12/11 0700 In: 515.9 [I.V.:365.9; IV Piggyback:150] Out: 1330 [Urine:1330]  Recent Labs  12/13/14 1839  12/14/14 0437  12/15/14 0455 12/15/14 1311 12/16/14 0455  WBC 4.3  --  9.3  --   --   --  3.7  HGB 7.6*  --  9.1*  --   --   --  7.8*  PLT 191  --  239  --   --   --  136*  CREATININE 7.54*  < > 4.74*  < > 2.16* 1.79* 1.37*  < > = values in this interval not displayed. Estimated Creatinine Clearance: 58.9 mL/min (by C-G formula based on Cr of 1.37).  Microbiology: Recent Results (from the past 720 hour(s))  Urine culture     Status: None (Preliminary result)   Collection Time: 12/13/14  8:30 AM  Result Value Ref Range Status   Specimen Description URINE, CLEAN CATCH  Final   Special Requests NONE  Final   Culture   Final    >=100,000 COLONIES/mL GRAM POSITIVE COCCI IDENTIFICATION AND SUSCEPTIBILITIES TO FOLLOW    Report Status PENDING  Incomplete  MRSA PCR Screening     Status: Abnormal   Collection Time: 12/13/14  3:00 PM  Result Value Ref Range Status   MRSA by PCR POSITIVE (A) NEGATIVE Final    Comment:        The GeneXpert MRSA Assay (FDA approved for NASAL specimens only), is one component of a comprehensive MRSA colonization surveillance program. It is not intended to diagnose MRSA infection nor to guide  or monitor treatment for MRSA infections. CRITICAL RESULT CALLED TO, READ BACK BY AND VERIFIED WITH:  DALE HOPKINS AT 2117 12/13/14 SDR   Culture, blood (routine x 2)     Status: None (Preliminary result)   Collection Time: 12/13/14  5:25 PM  Result Value Ref Range Status   Specimen Description BLOOD LEFT WRIST  Final   Special Requests   Final    BOTTLES DRAWN AEROBIC AND ANAEROBIC  1CC AERO, 2CC ANAERO   Culture NO GROWTH 3 DAYS  Final   Report Status PENDING  Incomplete  Culture, blood (routine x 2)     Status: None (Preliminary result)   Collection Time: 12/13/14  6:39 PM  Result Value Ref Range Status   Specimen Description BLOOD RIGHT ARM  Final   Special Requests BOTTLES DRAWN AEROBIC AND ANAEROBIC 7CC  Final   Culture NO GROWTH 3 DAYS  Final   Report Status PENDING  Incomplete    Medical History: Past Medical History  Diagnosis Date  . Other chronic pain   . Restless legs syndrome (RLS)   . Disorder of bone and cartilage, unspecified   . Unspecified essential hypertension   . Esophageal reflux   . Personal history of  malignant neoplasm of cervix uteri   . Irritable bowel syndrome   . Contact dermatitis and other eczema, due to unspecified cause   . Lumbago   . Migraine, unspecified, without mention of intractable migraine without mention of status migrainosus   . Unspecified schizophrenia, unspecified condition   . Personal history of colonic polyps   . Symptomatic menopausal or female climacteric states   . Other and unspecified hyperlipidemia   . Pain in joint, site unspecified   . Hypertonicity of bladder   . Pain in joint, forearm   . History of renal failure and respiratory acidosis (05/24/2014) 11/20/2014    Medications:  Scheduled:  . Chlorhexidine Gluconate Cloth  6 each Topical Q0600  . heparin  5,000 Units Subcutaneous 3 times per day  . insulin aspart  5 Units Intravenous Once  . meropenem (MERREM) IV  500 mg Intravenous Q12H  . midazolam  2 mg  Intravenous Once  . mupirocin ointment  1 application Nasal BID  . sodium chloride  3 mL Intravenous Q12H  . tuberculin  5 Units Intradermal Once   Assessment: Pt is a 49 year old female in acute renal failure with a possible UTI. Pt has a penicillin allergy (angioedema/rash). Pt ordered meropenem, pharmacy consulted for dosing.  CrCl:58.9  Plan:  Urine cultures showing >100,000 GPC, susceptibilities pending.  CrCl >50 ml/min, so will change dose to meropenem 500mg  q 8 hours.  Stop date for 12/18/14 is in place.  Pharmacy to monitor changes in renal function requiring dose adjustments.  Cher NakaiSheema Tarren Sabree, PharmD Pharmacy Resident  12/16/2014,10:04 AM

## 2014-12-16 NOTE — Progress Notes (Signed)
[facility]  ROUNDING NOTE   Subjective:   Alert and oriented this AM UOP appears to be adequate ~ 1330 cc S Cr improved to 1.37 (2.16) (2.85)  Objective:  Vital signs in last 24 hours:  Temp:  [96.6 F (35.9 C)-98.6 F (37 C)] 98.2 F (36.8 C) (12/11 0747) Pulse Rate:  [45-76] 71 (12/11 0600) Resp:  [17-25] 22 (12/11 0600) BP: (99-148)/(56-73) 99/65 mmHg (12/11 0600) SpO2:  [90 %-100 %] 95 % (12/11 0600)  Weight change:  Filed Weights   12/13/14 0751 12/13/14 1800  Weight: 99.791 kg (220 lb) 103.1 kg (227 lb 4.7 oz)    Intake/Output: I/O last 3 completed shifts: In: 515.9 [I.V.:365.9; IV Piggyback:150] Out: 2400 [Urine:2400]   Intake/Output this shift:  Total I/O In: 12.9 [I.V.:12.9] Out: -   Physical Exam: General: Critically ill  Head: Normocephalic, atraumatic.    Eyes: Anicteric,   Neck: Supple, trachea midline  Lungs:  Coarse breath sounds  Heart: Regular rate and rhythm  Abdomen:  Soft, nontender, obese  Extremities: no peripheral edema.  Neurologic: Alert, oriented, able to follow commands  Skin: No lesions  Access: Right femoral trialysis 12/8 [name] Haas    Basic Metabolic Panel:  Recent Labs Lab 12/13/14 1511 12/13/14 1839  12/14/14 1620 12/14/14 2204 12/15/14 0455 12/15/14 1311 12/16/14 0455  NA 137 139  < > 138 136 138 139 142  K >7.5* 7.4*  < > 5.1 5.1 5.1 4.5 4.4  CL 107 106  < > 103 104 104 107 109  CO2 16* 19*  < > 22 19* 25 25 26   GLUCOSE 78 97  < > 146* 159* 137* 115* 96  BUN 67* 68*  < > 43* 39* 36* 32* 27*  CREATININE 7.46*  7.70* 7.54*  < > 3.35* 2.85* 2.16* 1.79* 1.37*  CALCIUM 8.0* 7.8*  < > 8.4* 8.3* 8.4* 8.4* 8.4*  MG 2.3 2.1  --   --   --   --   --  1.9  PHOS  --  9.6*  --   --   --   --   --  2.9  < > = values in this interval not displayed.  Liver Function Tests:  Recent Labs Lab 12/13/14 0752 12/13/14 1511 12/15/14 1311  AST 32 29 24  ALT 16 15 15   ALKPHOS 105 92 71  BILITOT 0.9 0.9 0.8   PROT 6.8 6.2* 5.9*  ALBUMIN 3.0* 2.7* 2.4*   No results for input(s): LIPASE, AMYLASE in the last 168 hours.  Recent Labs Lab 12/13/14 0830  AMMONIA 36*    CBC:  Recent Labs Lab 12/13/14 0752 12/13/14 1511 12/13/14 1839 12/14/14 0437 12/16/14 0455  WBC 8.8 5.5 4.3 9.3 3.7  HGB 8.7* 7.8* 7.6* 9.1* 7.8*  HCT 28.2* 25.1* 23.2* 27.4* 23.5*  MCV 89.5 88.4 86.6 86.1 86.2  PLT 284 216 191 239 136*    Cardiac Enzymes: No results for input(s): CKTOTAL, CKMB, CKMBINDEX, TROPONINI in the last 168 hours.  BNP: Invalid input(s): POCBNP  CBG:  Recent Labs Lab 12/13/14 0807 12/13/14 1824 12/15/14 0655 12/15/14 1123 12/15/14 1723  GLUCAP 97 101* 99 115* 93    Microbiology: Results for orders placed or performed during the hospital encounter of 12/13/14  Urine culture     Status: None (Preliminary result)   Collection Time: 12/13/14  8:30 AM  Result Value Ref Range Status   Specimen Description URINE, CLEAN CATCH  Final   Special Requests NONE  Final  Culture   Final    >=100,000 COLONIES/mL GRAM POSITIVE COCCI IDENTIFICATION AND SUSCEPTIBILITIES TO FOLLOW    Report Status PENDING  Incomplete  MRSA PCR Screening     Status: Abnormal   Collection Time: 12/13/14  3:00 PM  Result Value Ref Range Status   MRSA by PCR POSITIVE (A) NEGATIVE Final    Comment:        The GeneXpert MRSA Assay (FDA approved for NASAL specimens only), is one component of a comprehensive MRSA colonization surveillance program. It is not intended to diagnose MRSA infection nor to guide or monitor treatment for MRSA infections. CRITICAL RESULT CALLED TO, READ BACK BY AND VERIFIED WITH:  [name] Haas AT 2117 12/13/14 SDR   Culture, blood (routine x 2)     Status: None (Preliminary result)   Collection Time: 12/13/14  5:25 PM  Result Value Ref Range Status   Specimen Description BLOOD LEFT WRIST  Final   Special Requests   Final    BOTTLES DRAWN AEROBIC AND ANAEROBIC  1CC AERO, 2CC  ANAERO   Culture NO GROWTH 3 DAYS  Final   Report Status PENDING  Incomplete  Culture, blood (routine x 2)     Status: None (Preliminary result)   Collection Time: 12/13/14  6:39 PM  Result Value Ref Range Status   Specimen Description BLOOD RIGHT ARM  Final   Special Requests BOTTLES DRAWN AEROBIC AND ANAEROBIC 7CC  Final   Culture NO GROWTH 3 DAYS  Final   Report Status PENDING  Incomplete    Coagulation Studies: No results for input(s): LABPROT, INR in the last 72 hours.  Urinalysis: No results for input(s): COLORURINE, LABSPEC, PHURINE, GLUCOSEU, HGBUR, BILIRUBINUR, KETONESUR, PROTEINUR, UROBILINOGEN, NITRITE, LEUKOCYTESUR in the last 72 hours.  Invalid input(s): APPERANCEUR    Imaging: No results found.   Medications:   . dexmedetomidine Stopped (12/16/14 0928)  . norepinephrine (LEVOPHED) Adult infusion Stopped (12/15/14 1200)  . vasopressin (PITRESSIN) infusion - *FOR SHOCK* Stopped (12/15/14 1300)   . Chlorhexidine Gluconate Cloth  6 each Topical Q0600  . heparin  5,000 Units Subcutaneous 3 times per day  . insulin aspart  5 Units Intravenous Once  . meropenem (MERREM) IV  500 mg Intravenous 3 times per day  . midazolam  2 mg Intravenous Once  . mupirocin ointment  1 application Nasal BID  . sodium chloride  3 mL Intravenous Q12H  . tuberculin  5 Units Intradermal Once   acetaminophen **OR** acetaminophen, morphine injection, ziprasidone  Assessment/ Plan:  [name] is a 49 y.o. white female with chronic pain syndrome, schizophrenia, restless leg syndrome, irritable bowel syndrome, history of pulmonary embolism who was admitted to [facility] on 12/13/2014   1. Acute renal failure on chronic kidney disease stage III: baseline creatinine of 1.16 from 12/07/14. 2. Hyperkalemia 3. Metabolic acidosis 4. Metabolic encephalopathy 5. Anemia 6. UTI    Electrolytes and Volume status are acceptable No acute indication for Dialysis at present  May remove dialysis  cathter when need for CVL is over Will sign off Please re consult as necessary   LOS: 3 [name] Haas 12/11/201610:20 AM
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
## 2014-12-16 NOTE — Progress Notes (Addendum)
PULMONARY / CRITICAL CARE MEDICINE   Name: [name] Haas MRN: [id number] DOB: 01-10-1965    ADMISSION DATE:  12/13/2014  BRIEF HISTORY: 49 year old female past medical history of paranoid schizophrenia,  chronic pain syndrome secondary to total right shoulder replacement presents and acute mental status changes found to be in acute renal failure with critically elevated potassium. Patient was emergently started on dialysis, course complicated by psych issues with delirium and agitation. She has also had persistent hypotension, which is now resolved.  SUBJECTIVE:   patient had acute delirium overnight, severe agitation. She continues to have a waxing and waning mental status. Her mental status was improved overnight after starting the patient on Precedex.  STUDIES:   SIGNIFICANT EVENTS:  VITAL SIGNS: Temp:  [96.6 F (35.9 C)-98.6 F (37 C)] 98.2 F (36.8 C) (12/11 0747) Pulse Rate:  [45-78] 78 (12/11 1000) Resp:  [16-24] 17 (12/11 1000) BP: (99-148)/(56-87) 120/66 mmHg (12/11 1000) SpO2:  [90 %-100 %] 95 % (12/11 1000) HEMODYNAMICS:   VENTILATOR SETTINGS:   INTAKE / OUTPUT:  Intake/Output Summary (Last 24 hours) at 12/16/14 1130 Last data filed at 12/16/14 [id number]  Gross per 24 hour  Intake 597.69 ml  Output   1330 ml  Net -732.31 ml    Review of Systems  Constitutional: Negative for fever, chills and malaise/fatigue.  Eyes: Negative for blurred vision.  Respiratory: Negative for cough.   Cardiovascular: Negative for chest pain.  Gastrointestinal: Negative for heartburn.  Genitourinary: Negative for dysuria.  Musculoskeletal: Positive for myalgias.  Skin: Negative for itching and rash.  Neurological: Negative for dizziness.  Endo/Heme/Allergies: Does not bruise/bleed easily.  Psychiatric/Behavioral: Negative for hallucinations.    Physical Exam  Constitutional: She is well-developed, well-nourished, and in no distress. No distress.  HENT:  Head: Normocephalic and  atraumatic.  Right Ear: External ear normal.  Left Ear: External ear normal.  Mouth/Throat: No oropharyngeal exudate.  Eyes: Pupils are equal, round, and reactive to light.  Neck: Normal range of motion. Neck supple. No thyromegaly present.  Cardiovascular: Normal rate, regular rhythm and normal heart sounds.   No murmur heard. Pulmonary/Chest: No respiratory distress. She has no wheezes. She has no rales. She exhibits no tenderness.  Abdominal: Soft. Bowel sounds are normal. She exhibits no distension. There is no tenderness.  Genitourinary: Vagina normal.  Musculoskeletal: Normal range of motion.  Neurological: She is alert. No cranial nerve deficit.  More alert, and aware to self today, does have periods of somnolence  Nursing note and vitals reviewed.    LABS:  CBC  Recent Labs Lab 12/13/14 1839 12/14/14 0437 12/16/14 0455  WBC 4.3 9.3 3.7  HGB 7.6* 9.1* 7.8*  HCT 23.2* 27.4* 23.5*  PLT 191 239 136*   Coag's No results for input(s): APTT, INR in the last 168 hours. BMET  Recent Labs Lab 12/15/14 0455 12/15/14 1311 12/16/14 0455  NA 138 139 142  K 5.1 4.5 4.4  CL 104 107 109  CO2 25 25 26   BUN 36* 32* 27*  CREATININE 2.16* 1.79* 1.37*  GLUCOSE 137* 115* 96   Electrolytes  Recent Labs Lab 12/13/14 1511 12/13/14 1839  12/15/14 0455 12/15/14 1311 12/16/14 0455  CALCIUM 8.0* 7.8*  < > 8.4* 8.4* 8.4*  MG 2.3 2.1  --   --   --  1.9  PHOS  --  9.6*  --   --   --  2.9  < > = values in this interval not displayed. Sepsis Markers  Recent Labs  Lab 12/13/14 1511 12/13/14 1839  LATICACIDVEN 1.6 1.4   ABG  Recent Labs Lab 12/13/14 1515 12/14/14 0600 12/14/14 1635  PHART 7.20* 7.37 7.35  PCO2ART 34 41 45  PO2ART 75* 69* 79*   Liver Enzymes  Recent Labs Lab 12/13/14 0752 12/13/14 1511 12/15/14 1311  AST 32 29 24  ALT ALKPHOS 105 92 71  BILITOT 0.9 0.9 0.8  ALBUMIN 3.0* 2.7* 2.4*   Cardiac Enzymes No results for input(s):  TROPONINI, PROBNP in the last 168 hours. Glucose  Recent Labs Lab 12/13/14 0807 12/13/14 1824 12/15/14 0655 12/15/14 1123 12/15/14 1723  GLUCAP 97 101* 99 115* 93    Imaging No results found.  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  NEUROLOGIC A:  Acute metabolic encephalopathy Acute mental status changes Severe agitation secondary to metabolic encephalopathy-this is been complicated by her dysphagia, she has been unable to take her usual psych meds which appears to have worsened her delirium. P:  -We'll wean off Precedex today, start other medications for delirium, will attempt to hold off on using Precedex as the patient cannot be transferred to the floor with this medication, and she appears to be otherwise stable for transfer. - Monitor neuro status - By history no toxic ingestion. - Father stated that he rechecked the patient's pill bottles, all pills are accounted for none are missing.   PULMONARY - Respiratory status currently stable, patient is protecting her airway, she does not have dyspnea. Maintain saturations greater than 90% Monitor neuro status  CARDIOVASCULAR -Pressors now have been weaned off. -Monitor blood pressure    RENAL A:  Acute renal failure Hyperkalemia Metabolic acidosis P:  -attempted A-line placement, but unsuccessful.  -Emergent dialysis for hyperkalemia- now stable -Nephrology following-appreciate recommendations -Monitor urine output-improving  GASTROINTESTINAL SUP  HEMATOLOGIC Anemia -Monitor CBC  INFECTIOUS Suspected urinary  tract infection P:  - History of Escherichia coli in the past -Received 1 dose of ciprofloxacin and started on meropenem, stop date set for 5 days of abx.  -Follow-up cultures  MRSA PCR positive. Blood cultures 12/8, negative thus far. Urine cultures 12/8, negative thus far.   ENDOCRINE - ICU hypoglycemia/hyperglycemia protocol    Deep Asante Blanda M.D. [facility] On Call Pager - [phone] (please enter 7-digits)   Note: This note was prepared with Dragon dictation along with smaller phrase technology. Any transcriptional errors that result from this process are unintentional.  Critical Care Attestation.  I have personally obtained a history, examined the patient, evaluated laboratory and imaging results, formulated the assessment and plan and placed orders. The Patient requires high complexity decision making for assessment and support, frequent evaluation and titration of therapies, application of advanced monitoring technologies and extensive interpretation of multiple databases. The patient has critical illness that could lead imminently to failure of 1 or more organ systems and requires the highest level of physician preparedness to intervene.  Critical Care Time devoted to patient care services described in this note is 35 minutes and is exclusive of time spent in procedures.

## 2014-12-17 LAB — BASIC METABOLIC PANEL
Anion gap: 11 (ref 5–15)
BUN: 21 mg/dL — ABNORMAL HIGH (ref 6–20)
CALCIUM: 8.6 mg/dL — AB (ref 8.9–10.3)
CO2: 22 mmol/L (ref 22–32)
CREATININE: 1.18 mg/dL — AB (ref 0.44–1.00)
Chloride: 109 mmol/L (ref 101–111)
GFR calc non Af Amer: 52 mL/min — ABNORMAL LOW (ref >60)
Glucose, Bld: 92 mg/dL (ref 65–99)
Potassium: 4.6 mmol/L (ref 3.5–5.1)
SODIUM: 142 mmol/L (ref 135–145)

## 2014-12-17 LAB — CBC
HCT: 30.3 % — ABNORMAL LOW (ref 35.0–47.0)
Hemoglobin: 9.4 g/dL — ABNORMAL LOW (ref 12.0–16.0)
MCH: 27.3 pg (ref 26.0–34.0)
MCHC: 31.2 g/dL — AB (ref 32.0–36.0)
MCV: 87.4 fL (ref 80.0–100.0)
PLATELETS: 152 10*3/uL (ref 150–440)
RBC: 3.46 MIL/uL — ABNORMAL LOW (ref 3.80–5.20)
RDW: 16.2 % — AB (ref 11.5–14.5)
WBC: 6.7 10*3/uL (ref 3.6–11.0)

## 2014-12-17 LAB — MAGNESIUM: MAGNESIUM: 2.2 mg/dL (ref 1.7–2.4)

## 2014-12-17 LAB — PHOSPHORUS: PHOSPHORUS: 3.8 mg/dL (ref 2.5–4.6)

## 2014-12-17 MED ORDER — ESCITALOPRAM OXALATE 10 MG PO TABS
10.0000 mg | ORAL_TABLET | Freq: Every day | ORAL | Status: DC
  Administered 2014-12-17 – 2014-12-20 (×4): 10 mg via ORAL
  Filled 2014-12-17 (×4): qty 1

## 2014-12-17 MED ORDER — SODIUM CHLORIDE 0.9 % IV SOLN
INTRAVENOUS | Status: DC
  Administered 2014-12-17: 18:00:00 via INTRAVENOUS

## 2014-12-17 MED ORDER — METOCLOPRAMIDE HCL 5 MG/ML IJ SOLN
5.0000 mg | Freq: Four times a day (QID) | INTRAMUSCULAR | Status: DC
  Administered 2014-12-17 – 2014-12-18 (×3): 5 mg via INTRAVENOUS
  Filled 2014-12-17 (×3): qty 2

## 2014-12-17 MED ORDER — PANTOPRAZOLE SODIUM 40 MG IV SOLR
40.0000 mg | Freq: Two times a day (BID) | INTRAVENOUS | Status: DC
  Administered 2014-12-17 – 2014-12-19 (×5): 40 mg via INTRAVENOUS
  Filled 2014-12-17 (×6): qty 40

## 2014-12-17 MED ORDER — QUETIAPINE FUMARATE 300 MG PO TABS
600.0000 mg | ORAL_TABLET | Freq: Every day | ORAL | Status: DC
  Administered 2014-12-17 – 2014-12-22 (×6): 600 mg via ORAL
  Filled 2014-12-17 (×7): qty 2

## 2014-12-17 NOTE — Care Management Important Message (Signed)
Important Message  Patient Details  Name: [name] Haas MRN: [id number] Date of Birth: 1965/09/20   Medicare Important Message Given:  Yes    [name] Haas, [name] Stuckey L, RN 12/17/2014, 10:44 AM

## 2014-12-17 NOTE — Progress Notes (Signed)
[facility] [place] at [facility]                                                                                                                                                                                            Patient Demographics   [name] Haas, is a 49 y.o. female, DOB - [birth year], NWG:[id number]  Admit date - 12/13/2014   Admitting Physician Auburn Bilberry, MD  Outpatient Primary MD for the patient is [name], Johnney Killian, MD   LOS - 4  Subjective: transferred to regular room yesterday.has lot of nausea,vomting this am.no abdominal pain.had 3 loose stools.no other complaints, Review of Systems:   CONSTITUTIONAL:not  confused anymore.  Review of systems:  Awake alert oriented Cardiovascular; no chest pain  Lungs; no cough, shortness of breath. GI: nausea,vomiting,diarrhoea,. Neurological: No headache, no double vision, no weakness in the hands or legs. Psych ;patient right now is not agitated.  Vitals:   Filed Vitals:   12/16/14 1904 12/16/14 2301 12/17/14 0300 12/17/14 1159  BP: 150/79 154/72 150/78 159/67  Pulse: 70 77 82 85  Temp: 98.3 F (36.8 C) 98.2 F (36.8 C) 98 F (36.7 C) 97.6 F (36.4 C)  TempSrc: Oral Oral Oral Oral  Resp: Height:      Weight:      SpO2: 95% 93% 96% 92%    Wt Readings from Last 3 Encounters:  12/13/14 103.1 kg (227 lb 4.7 oz)  11/19/14 96.163 kg (212 lb)  06/07/14 100.336 kg (221 lb 3.2 oz)     Intake/Output Summary (Last 24 hours) at 12/17/14 1342 Last data filed at 12/17/14 1300  Gross per 24 hour  Intake     60 ml  Output   1500 ml  Net  -1440 ml    Physical Exam:   GENERAL: Critically ill-appearing female HEAD, EYES, EARS, NOSE AND THROAT: Atraumatic, normocephalic. Extraocular muscles are intact. Pupils equal and reactive to light. Sclerae anicteric. No conjunctival injection. No oro-pharyngeal erythema.  NECK: Supple. There is no jugular venous distention. No bruits, no  lymphadenopathy, no thyromegaly.  HEART: Regular rate and rhythm,. No murmurs, no rubs, no clicks.  LUNGS: Clear to auscultation bilaterally. No rales or rhonchi. No wheezes.  ABDOMEN: Soft, flat, nontender, nondistended. Has good bowel sounds. No hepatosplenomegaly appreciated.  EXTREMITIES: No evidence of any cyanosis, clubbing, or peripheral edema.  +2 pedal and radial pulses bilaterally.  Neuro; cranial nerves II-12 grossly intact strength 5 over 5 in all 4 extremities  SKIN: Moist and warm with no rashes appreciated.  Psych: Not anxious, depressed LN: No inguinal LN enlargement  Antibiotics   Anti-infectives    Start     Dose/Rate Route Frequency Ordered Stop   12/16/14 1500  meropenem (MERREM) 500 mg in sodium chloride 0.9 % 50 mL IVPB     500 mg 100 mL/hr over 30 Minutes Intravenous 3 times per day 12/16/14 1007     12/13/14 1445  meropenem (MERREM) 500 mg in sodium chloride 0.9 % 50 mL IVPB  Status:  Discontinued     500 mg 100 mL/hr over 30 Minutes Intravenous Every 12 hours 12/13/14 1438 12/16/14 1007   12/13/14 1400  meropenem (MERREM) 1 g in sodium chloride 0.9 % 100 mL IVPB  Status:  Discontinued     1 g 200 mL/hr over 30 Minutes Intravenous 3 times per day 12/13/14 1002 12/13/14 1437   12/13/14 0930  ciprofloxacin (CIPRO) IVPB 400 mg     400 mg 200 mL/hr over 60 Minutes Intravenous  Once 12/13/14 0923 12/13/14 1040      Medications   Scheduled Meds: . Chlorhexidine Gluconate Cloth  6 each Topical Q0600  . heparin  5,000 Units Subcutaneous 3 times per day  . insulin aspart  5 Units Intravenous Once  . meropenem (MERREM) IV  500 mg Intravenous 3 times per day  . metoCLOPramide (REGLAN) injection  5 mg Intravenous 4 times per day  . midazolam  2 mg Intravenous Once  . mupirocin ointment  1 application Nasal BID  . pantoprazole (PROTONIX) IV  40 mg Intravenous Q12H  . risperiDONE  1 mg Oral QHS  . sodium chloride  3 mL Intravenous Q12H   Continuous  Infusions: . sodium chloride    . norepinephrine (LEVOPHED) Adult infusion Stopped (12/15/14 1200)  . vasopressin (PITRESSIN) infusion - *FOR SHOCK* Stopped (12/15/14 1300)   PRN Meds:.acetaminophen **OR** acetaminophen, haloperidol, morphine injection, ondansetron (ZOFRAN) IV, ziprasidone   Data Review:   Micro Results Recent Results (from the past 240 hour(s))  Urine culture     Status: None (Preliminary result)   Collection Time: 12/13/14  8:30 AM  Result Value Ref Range Status   Specimen Description URINE, CLEAN CATCH  Final   Special Requests NONE  Final   Culture   Final    >=100,000 COLONIES/mL GRAM POSITIVE COCCI IDENTIFICATION AND SUSCEPTIBILITIES TO FOLLOW    Report Status PENDING  Incomplete  MRSA PCR Screening     Status: Abnormal   Collection Time: 12/13/14  3:00 PM  Result Value Ref Range Status   MRSA by PCR POSITIVE (A) NEGATIVE Final    Comment:        The GeneXpert MRSA Assay (FDA approved for NASAL specimens only), is one component of a comprehensive MRSA colonization surveillance program. It is not intended to diagnose MRSA infection nor to guide or monitor treatment for MRSA infections. CRITICAL RESULT CALLED TO, READ BACK BY AND VERIFIED WITH:  DALE HOPKINS AT 2117 12/13/14 SDR   Culture, blood (routine x 2)     Status: None (Preliminary result)   Collection Time: 12/13/14  5:25 PM  Result Value Ref Range Status   Specimen Description BLOOD LEFT WRIST  Final   Special Requests   Final    BOTTLES DRAWN AEROBIC AND ANAEROBIC  1CC AERO, 2CC ANAERO   Culture NO GROWTH 3 DAYS  Final   Report Status PENDING  Incomplete  Culture, blood (routine x 2)     Status: None (Preliminary result)   Collection Time: 12/13/14  6:39 PM  Result Value Ref Range Status  Specimen Description BLOOD RIGHT ARM  Final   Special Requests BOTTLES DRAWN AEROBIC AND ANAEROBIC 7CC  Final   Culture NO GROWTH 3 DAYS  Final   Report Status PENDING  Incomplete    Radiology  Reports Dg Chest 1 View  12/14/2014  CLINICAL DATA:  Acute onset of shortness of breath and hypotension. Initial encounter. EXAM: CHEST 1 VIEW COMPARISON:  Chest radiograph performed 12/13/2014 FINDINGS: The lungs are hypoexpanded. A small left pleural effusion is suspected. Mildly increased central lung markings could reflect minimal interstitial edema or possibly pneumonia. No pneumothorax is seen. The cardiomediastinal silhouette is borderline normal in size. No acute osseous abnormalities are seen. A left shoulder arthroplasty is incompletely imaged, but appears grossly unremarkable. IMPRESSION: Lungs hypoexpanded. Suspect small left pleural effusion. Mildly increased central lung markings may reflect minimal interstitial edema or possibly pneumonia. Electronically Signed   By: Roanna RaiderJeffery  Chang M.D.   On: 12/14/2014 01:58   Dg Chest 2 View  12/13/2014  CLINICAL DATA:  Altered mental status, pale, diaphoretic. EXAM: CHEST  2 VIEW COMPARISON:  Chest x-ray of October 09, 2013 FINDINGS: The lungs are hypoinflated. There is crowding of the pulmonary vascularity. The cardiac silhouette is enlarged. The pulmonary vascularity is indistinct. There is no pleural effusion. 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 IMPRESSION: Bilateral pulmonary hypoinflation. Stable enlargement cardiac silhouette. No definite pulmonary edema or pneumonia allowing for vascular crowding. Electronically Signed   By: David  SwazilandJordan M.D.   On: 12/13/2014 09:17   Ct Head Wo Contrast  12/13/2014  CLINICAL DATA:  Altered mental status. EXAM: CT HEAD WITHOUT CONTRAST TECHNIQUE: Contiguous axial images were obtained from the base of the skull through the vertex without intravenous contrast. COMPARISON:  10/08/2013 FINDINGS: Skull and Sinuses:Negative for fracture or destructive process. The visualized mastoids, middle ears, and imaged paranasal sinuses are clear. Orbits: Scleral band on the right.  No acute finding. Brain: No evidence of acute  infarction, hemorrhage, hydrocephalus, or mass lesion/mass effect. IMPRESSION: Negative head CT. Electronically Signed   By: Marnee SpringJonathon  Watts M.D.   On: 12/13/2014 08:33   [place] Renal  12/13/2014  CLINICAL DATA:  49 year old female with acute renal failure. Initial encounter. EXAM: RENAL / URINARY TRACT ULTRASOUND COMPLETE COMPARISON:  10/11/2013. FINDINGS: Right Kidney: Evaluation limited by patient's habitus and patient unable to cooperate. Atrophic lobular appearance of the right kidney without hydronephrosis. 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 If further delineation of the right kidney is clinically desired, CT imaging (can be performed without contrast) may be considered. Left Kidney: Length: 9.2 cm. Echogenicity within normal limits. No mass or hydronephrosis visualized. Bladder: Appears normal for degree of bladder distention. IMPRESSION: Exam limited.  Particularly limited evaluation of the right kidney. Right kidney appears atrophic and lobulated. Follow-up as noted above. No hydronephrosis detected. Electronically Signed   By: Lacy DuverneySteven  Olson M.D.   On: 12/13/2014 11:02     CBC  Recent Labs Lab 12/13/14 1511 12/13/14 1839 12/14/14 0437 12/16/14 0455 12/17/14 0509  WBC 5.5 4.3 9.3 3.7 6.7  HGB 7.8* 7.6* 9.1* 7.8* 9.4*  HCT 25.1* 23.2* 27.4* 23.5* 30.3*  PLT 216 191 239 136* 152  MCV 88.4 86.6 86.1 86.2 87.4  MCH 27.5 28.3 28.6 28.6 27.3  MCHC 31.2* 32.6 33.3 33.2 31.2*  RDW 17.3* 17.1* 17.3* 15.9* 16.2*    Chemistries   Recent Labs Lab 12/13/14 0752 12/13/14 1511 12/13/14 1839  12/14/14 2204 12/15/14 0455 12/15/14 1311 12/16/14 0455 12/17/14 0509  NA 136  137 139  < > 136 138 139 142 142  K 6.6* >7.5* 7.4*  < > 5.1 5.1 4.5 4.4 4.6  CL 104 107 106  < > 104 104 107 109 109  CO2 13* 16* 19*  < > 19* 25 25 26 22   GLUCOSE 103* 78 97  < > 159* 137* 115* 96 92  BUN 63* 67* 68*  < > 39* 36* 32* 27* 21*  CREATININE 7.53* 7.46*  7.70* 7.54*  < > 2.85*  2.16* 1.79* 1.37* 1.18*  CALCIUM 8.3* 8.0* 7.8*  < > 8.3* 8.4* 8.4* 8.4* 8.6*  MG  --  2.3 2.1  --   --   --   --  1.9 2.2  AST 32 29  --   --   --   --  24  --   --   ALT 16 15  --   --   --   --  15  --   --   ALKPHOS 105 92  --   --   --   --  71  --   --   BILITOT 0.9 0.9  --   --   --   --  0.8  --   --   < > = values in this interval not displayed. ------------------------------------------------------------------------------------------------------------------ estimated creatinine clearance is 68.4 mL/min (by C-G formula based on Cr of 1.18). ------------------------------------------------------------------------------------------------------------------ No results for input(s): HGBA1C in the last 72 hours. ------------------------------------------------------------------------------------------------------------------ No results for input(s): CHOL, HDL, LDLCALC, TRIG, CHOLHDL, LDLDIRECT in the last 72 hours. ------------------------------------------------------------------------------------------------------------------ No results for input(s): TSH, T4TOTAL, T3FREE, THYROIDAB in the last 72 hours.  Invalid input(s): FREET3 ------------------------------------------------------------------------------------------------------------------ No results for input(s): VITAMINB12, FOLATE, FERRITIN, TIBC, IRON, RETICCTPCT in the last 72 hours.  Coagulation profile No results for input(s): INR, PROTIME in the last 168 hours.  No results for input(s): DDIMER in the last 72 hours.  Cardiac Enzymes No results for input(s): CKMB, TROPONINI, MYOGLOBIN in the last 168 hours.  Invalid input(s): CK ------------------------------------------------------------------------------------------------------------------ Invalid input(s): POCBNP    Assessment & Plan   IMPRESSION AND PLAN: Patient is a 49 year old white female brought in by family due to confusion 1. Acute encephalopathy: The  combination of acute renal failure and ATN.   Encephalopathy resolved. Off percedex drip . Watch closely for further agitation. Nausea,vomting this am;started IV fluids ,reglan,IV PPI>if nausea resolves ,can start clear liquids  2. Hypotension could be related to urinary tract infection and sepsis.[Resolved. Off the pressors.  3. Acute renal failure with severe hyperkalemia;, metabolic acidosis with metabolic encephalopathy:patient received bicarbonate drip. It is post emergency hemodialysis. Renal function improved with good urine output.possible rhabdomyolysis on admission  Along with NSAId use;was taking diclofenac gel,but CPK was not checked.or  Potassium is better, renal function is better today. 4.suspected  Urinary tract infection:continue abx. 6.  Bipolar disorder hold all her psychiatric medications; psych consulted  Possibly need to restart psych medications including seroquel/ Lexapro.       Code Status Orders        Start     Ordered   12/13/14 1510  Full code   Continuous     12/13/14 1509           Consults  nephrologlo  DVT Prophylaxis heparin  Lab Results  Component Value Date   PLT 152 12/17/2014     Time Spent in minutes  crtical care   Teona Vargus M.D on 12/17/2014 at 1:42 PM  Between 7am to 6pm -  Pager - [phone]  After 6pm go to [email or web address] - password [email or web address] [facility]   Office  [phone]

## 2014-12-17 NOTE — Progress Notes (Signed)
ANTIBIOTIC CONSULT NOTE - INITIAL  Pharmacy Consult for meropenem Indication: UTI  Allergies  Allergen Reactions  . Alcohol Swelling    overall swelling  . Bextra [Valdecoxib]   . Celebrex [Celecoxib]   . Penicillins     Angioedema & rash  . Sulfa Antibiotics   . Sulfacetamide Sodium   . Vioxx [Rofecoxib]   . Metformin Rash    Sores, ulcers    Patient Measurements: Height: 5\' 6"  (167.6 cm) Weight: 227 lb 4.7 oz (103.1 kg) IBW/kg (Calculated) : 59.3  Vital Signs: Temp: 97.6 F (36.4 C) (12/12 1159) Temp Source: Oral (12/12 1159) BP: 159/67 mmHg (12/12 1159) Pulse Rate: 85 (12/12 1159)  Recent Labs  12/15/14 1311 12/16/14 0455 12/17/14 0509  WBC  --  3.7 6.7  HGB  --  7.8* 9.4*  PLT  --  136* 152  CREATININE 1.79* 1.37* 1.18*   Estimated Creatinine Clearance: 68.4 mL/min (by C-G formula based on Cr of 1.18).  Microbiology: Recent Results (from the past 720 hour(s))  Urine culture     Status: None (Preliminary result)   Collection Time: 12/13/14  8:30 AM  Result Value Ref Range Status   Specimen Description URINE, CLEAN CATCH  Final   Special Requests NONE  Final   Culture   Final    >=100,000 COLONIES/mL GRAM POSITIVE COCCI IDENTIFICATION AND SUSCEPTIBILITIES TO FOLLOW    Report Status PENDING  Incomplete  MRSA PCR Screening     Status: Abnormal   Collection Time: 12/13/14  3:00 PM  Result Value Ref Range Status   MRSA by PCR POSITIVE (A) NEGATIVE Final    Comment:        The GeneXpert MRSA Assay (FDA approved for NASAL specimens only), is one component of a comprehensive MRSA colonization surveillance program. It is not intended to diagnose MRSA infection nor to guide or monitor treatment for MRSA infections. CRITICAL RESULT CALLED TO, READ BACK BY AND VERIFIED WITH:  DALE HOPKINS AT 2117 12/13/14 SDR   Culture, blood (routine x 2)     Status: None (Preliminary result)   Collection Time: 12/13/14  5:25 PM  Result Value Ref Range Status   Specimen Description BLOOD LEFT WRIST  Final   Special Requests   Final    BOTTLES DRAWN AEROBIC AND ANAEROBIC  1CC AERO, 2CC ANAERO   Culture NO GROWTH 3 DAYS  Final   Report Status PENDING  Incomplete  Culture, blood (routine x 2)     Status: None (Preliminary result)   Collection Time: 12/13/14  6:39 PM  Result Value Ref Range Status   Specimen Description BLOOD RIGHT ARM  Final   Special Requests BOTTLES DRAWN AEROBIC AND ANAEROBIC 7CC  Final   Culture NO GROWTH 3 DAYS  Final   Report Status PENDING  Incomplete   Assessment: Pharmacy consulted to dose meropenem for UTI in this 49 year old female.   Plan:  Urine cultures showing >100,000 GPC, susceptibilities pending.  Continue meropenem 500mg  IV Q8H Stop date for 12/18/14 is in place.  Continue to follow cultures, renal function  Garlon HatchetJody Jozi Malachi, PharmD Clinical Pharmacist   12/17/2014,2:20 PM

## 2014-12-17 NOTE — Evaluation (Signed)
Clinical/Bedside Swallow Evaluation Patient Details  Name: [name] MainsKendra A Salay MRN: [id number] Date of Birth: [birth year]  Today's Date: 12/17/2014 Time: SLP Start Time (ACUTE ONLY): 1045 SLP Stop Time (ACUTE ONLY): 1145 SLP Time Calculation (min) (ACUTE ONLY): 60 min  Past Medical History:  Past Medical History  Diagnosis Date  . Other chronic pain   . Restless legs syndrome (RLS)   . Disorder of bone and cartilage, unspecified   . Unspecified essential hypertension   . Esophageal reflux   . Personal history of malignant neoplasm of cervix uteri   . Irritable bowel syndrome   . Contact dermatitis and other eczema, due to unspecified cause   . Lumbago   . Migraine, unspecified, without mention of intractable migraine without mention of status migrainosus   . Unspecified schizophrenia, unspecified condition   . Personal history of colonic polyps   . Symptomatic menopausal or female climacteric states   . Other and unspecified hyperlipidemia   . Pain in joint, site unspecified   . Hypertonicity of bladder   . Pain in joint, forearm   . History of renal failure and respiratory acidosis (05/24/2014) 11/20/2014   Past Surgical History:  Past Surgical History  Procedure Laterality Date  . Breast lumpectomy      left multiple benign  . Hand surgery  1988    left tendon transfer  . Vaginal hysterectomy  2005  . Bilateral oophorectomy  2005  . Cervical cone biopsy  2005  . Shoulder arthroscopy  2007    right  . Retinal detachment surgery  2010    right, repair   HPI:  Pt is a 49 y.o. female with multiple medical problems including Bipolar disorder, GERD, chronic pain, restless leg syndrome, irritable bowel syndrome who according to her father was acting normally yesterday morning. Last night she had some nausea but was awake but when this morning he noticed she was very confused and was not herself. Patient brought to the emergency room noted to have a creatinine this very elevated,  she has a creatinine level of 7.53 her recent creatinine on 12/06/2013 was 1.16. She also was noted to have urinary tract infection. Pt was severely confused initially at admission and agitated requiring sedation. Pt is currently awake, calm and wanting po's. Pt has been experiencing N/V this AM.    Assessment / Plan / Recommendation Clinical Impression  Pt appeared to tolerate trials of purees and soft solids(broken down well) w/ no overt s/s of aspiration noted; no cough or throat clear or change in vocal quality or respiratory status noted. During trials of thin liquids via cup and straw, pt exhibited a mild, delayed throat clear/cough when drinking - it appeared to occur when pt took too large a sip from the cup and when she took multiple sips of liquid at a time. When the boluses were controlled and smaller in amount, pt exhibited no overt s/s ofaspiration; no change in vocal quality and no change in respiratory effort. Pt exhibited no significant oral phase deficits; timely A-P transfer and clearing noted. Pt required some feeding assistance sec. to weakness. Would rec. a modified diet(dys. 3) at this time, however, at the end of the session, pt exhibited gagging then belching and regurgitation of a moderate amount. D/t this clinicial presentation, MD was consulted and agreed w/ NPO status w/ initiation of a clear liquid diet tomorrow if able. NSG updated.    Aspiration Risk  Mild aspiration risk (regurgiation occuring w/ po's)    Diet  Recommendation  NPO w/ initiation of a clear liquid diet if possible tomorrow; aspiration precautions; assistance at meals  Medication Administration: Whole meds with puree    Other  Recommendations Recommended Consults: Consider GI evaluation Oral Care Recommendations: Oral care BID;Staff/trained caregiver to provide oral care   Follow up Recommendations   (TBD)    Frequency and Duration min 3x week  1 week       Prognosis Prognosis for Safe Diet  Advancement: Fair Barriers/Prognosis Comment: GI issues      Swallow Study   General Date of Onset: 12/13/14 HPI: Pt is a 49 y.o. female with multiple medical problems including Bipolar disorder, GERD, chronic pain, restless leg syndrome, irritable bowel syndrome who according to her father was acting normally yesterday morning. Last night she had some nausea but was awake but when this morning he noticed she was very confused and was not herself. Patient brought to the emergency room noted to have a creatinine this very elevated, she has a creatinine level of 7.53 her recent creatinine on 12/06/2013 was 1.16. She also was noted to have urinary tract infection. Pt was severely confused initially at admission and agitated requiring sedation. Pt is currently awake, calm and wanting po's. Pt has been experiencing N/V this AM.  Type of Study: Bedside Swallow Evaluation Previous Swallow Assessment: none Diet Prior to this Study: Regular;Thin liquids (at home; NPO since admission) Temperature Spikes Noted: No (wbc 6.7) Respiratory Status: Room air History of Recent Intubation: No Behavior/Cognition: Alert;Cooperative;Pleasant mood;Requires cueing Oral Cavity Assessment: Dry Oral Care Completed by SLP: Recent completion by staff Oral Cavity - Dentition: Adequate natural dentition;Missing dentition Vision: Functional for self-feeding Self-Feeding Abilities: Able to feed self;Needs assist;Needs set up Patient Positioning: Upright in bed Baseline Vocal Quality: Normal Volitional Cough: Strong Volitional Swallow: Able to elicit    Oral/Motor/Sensory Function Overall Oral Motor/Sensory Function: Within functional limits   Ice Chips Ice chips: Within functional limits Presentation: Spoon (fed; 3 trials)   Thin Liquid Thin Liquid: Impaired Presentation: Cup;Self Fed;Straw Oral Phase Impairments:  (none) Oral Phase Functional Implications:  (none) Pharyngeal  Phase Impairments: Throat Clearing -  Delayed;Cough - Delayed (x3(once during multiple swallows); rest of trials single) Other Comments: Pt exhibited inconsistent throat clear and cough both delayed x2 w/ ~5-6 ozs of thin liquid.     Nectar Thick Nectar Thick Liquid: Not tested   Honey Thick Honey Thick Liquid: Not tested   Puree Puree: Within functional limits Presentation: Self Fed;Spoon (6-7 trials)   Solid Solid: Within functional limits Presentation: Self Fed (4 trials)      Jerilynn Som, MS, CCC-SLP  Watson,Katherine 12/17/2014,3:16 PM

## 2014-12-18 LAB — CULTURE, BLOOD (ROUTINE X 2)
CULTURE: NO GROWTH
CULTURE: NO GROWTH

## 2014-12-18 MED ORDER — DICYCLOMINE HCL 20 MG PO TABS
20.0000 mg | ORAL_TABLET | Freq: Every day | ORAL | Status: DC
  Administered 2014-12-18 – 2014-12-23 (×6): 20 mg via ORAL
  Filled 2014-12-18 (×6): qty 1

## 2014-12-18 NOTE — Progress Notes (Signed)
Spoke with Dr.Willis about using Trialysis catheter for IV fluids due to peripheral IV infiltrated. Dr.Willis verbalized that it was okay to use trialysis catheter for iv fluids . [name],[name] M

## 2014-12-18 NOTE — Progress Notes (Signed)
PT Cancellation Note  Patient Details Name: Lita MainsKendra A Kimber MRN: [id number] DOB: Jan 09, 1965   Cancelled Treatment:    Reason Eval/Treat Not Completed: Other (comment). Per chart review, pt with R temp fem cath. Per latest MD note on 12/11, may remove when CVL is done. Per RN, not sure when temp fem cath will be removed. Will hold PT eval until updated information obtained about temp fem cath. Mobility contraindicated at this time. WIll re-attempt next date.   Blayne Garlick 12/18/2014, 4:01 PM Elizabeth PalauStephanie Kareen Hitsman, PT, DPT [phone]

## 2014-12-18 NOTE — Progress Notes (Signed)
[facility] [place] [facility]                                                                                                                                                                                            Patient Demographics   [name] Haas, is a 49 y.o. female, DOB - [birth year], ZOX:[id number]  Admit date - 12/13/2014   Admitting Physician Auburn Bilberry, MD  Outpatient Primary MD for the patient is [name], Johnney Killian, MD   LOS - 5  Subjective: For the nausea, diarrhea. And wants to start her Bentyl. Review of Systems:   CONSTITUTIONAL:not  confused anymore.  Review of systems:  Awake alert oriented Cardiovascular; no chest pain  Lungs; no cough, shortness of breath. GI: no  Nausea, no vomiting, no abdominal pain, no diarrhea. Neurological: No headache, no double vision, no weakness in the hands or legs. Psych ;patient right now is not agitated.  Vitals:   Filed Vitals:   12/17/14 0300 12/17/14 1159 12/17/14 2047 12/18/14 0458  BP: 150/78 159/67 147/76 141/68  Pulse: 82 85 83 93  Temp: 98 F (36.7 C) 97.6 F (36.4 C) 98.4 F (36.9 C) 98 F (36.7 C)  TempSrc: Oral Oral Oral Oral  Resp: Height:      Weight:      SpO2: 96% 92% 94% 93%    Wt Readings from Last 3 Encounters:  12/13/14 103.1 kg (227 lb 4.7 oz)  11/19/14 96.163 kg (212 lb)  06/07/14 100.336 kg (221 lb 3.2 oz)     Intake/Output Summary (Last 24 hours) at 12/18/14 1049 Last data filed at 12/18/14 0818  Gross per 24 hour  Intake    875 ml  Output   1300 ml  Net   -425 ml    Physical Exam:   GENERAL: Critically ill-appearing female HEAD, EYES, EARS, NOSE AND THROAT: Atraumatic, normocephalic. Extraocular muscles are intact. Pupils equal and reactive to light. Sclerae anicteric. No conjunctival injection. No oro-pharyngeal erythema.  NECK: Supple. There is no jugular venous distention. No bruits, no lymphadenopathy, no thyromegaly.  HEART:  Regular rate and rhythm,. No murmurs, no rubs, no clicks.  LUNGS: Clear to auscultation bilaterally. No rales or rhonchi. No wheezes.  ABDOMEN: Soft, flat, nontender, nondistended. Has good bowel sounds. No hepatosplenomegaly appreciated.  EXTREMITIES: No evidence of any cyanosis, clubbing, or peripheral edema.  +2 pedal and radial pulses bilaterally.  Neuro; cranial nerves II-12 grossly intact strength 5 over 5 in all 4 extremities  SKIN: Moist and warm with no rashes appreciated.  Psych: Not anxious, depressed LN: No inguinal  LN enlargement    Antibiotics   Anti-infectives    Start     Dose/Rate Route Frequency Ordered Stop   12/16/14 1500  meropenem (MERREM) 500 mg in sodium chloride 0.9 % 50 mL IVPB     500 mg 100 mL/hr over 30 Minutes Intravenous 3 times per day 12/16/14 1007     12/13/14 1445  meropenem (MERREM) 500 mg in sodium chloride 0.9 % 50 mL IVPB  Status:  Discontinued     500 mg 100 mL/hr over 30 Minutes Intravenous Every 12 hours 12/13/14 1438 12/16/14 1007   12/13/14 1400  meropenem (MERREM) 1 g in sodium chloride 0.9 % 100 mL IVPB  Status:  Discontinued     1 g 200 mL/hr over 30 Minutes Intravenous 3 times per day 12/13/14 1002 12/13/14 1437   12/13/14 0930  ciprofloxacin (CIPRO) IVPB 400 mg     400 mg 200 mL/hr over 60 Minutes Intravenous  Once 12/13/14 0923 12/13/14 1040      Medications   Scheduled Meds: . escitalopram  10 mg Oral Daily  . heparin  5,000 Units Subcutaneous 3 times per day  . insulin aspart  5 Units Intravenous Once  . meropenem (MERREM) IV  500 mg Intravenous 3 times per day  . metoCLOPramide (REGLAN) injection  5 mg Intravenous 4 times per day  . midazolam  2 mg Intravenous Once  . mupirocin ointment  1 application Nasal BID  . pantoprazole (PROTONIX) IV  40 mg Intravenous Q12H  . QUEtiapine  600 mg Oral QHS  . risperiDONE  1 mg Oral QHS  . sodium chloride  3 mL Intravenous Q12H   Continuous Infusions: . sodium chloride 75 mL/hr at  12/17/14 1811   PRN Meds:.acetaminophen **OR** acetaminophen, haloperidol, morphine injection, ondansetron (ZOFRAN) IV, ziprasidone   Data Review:   Micro Results Recent Results (from the past 240 hour(s))  Urine culture     Status: None (Preliminary result)   Collection Time: 12/13/14  8:30 AM  Result Value Ref Range Status   Specimen Description URINE, CLEAN CATCH  Final   Special Requests NONE  Final   Culture   Final    >=100,000 COLONIES/mL GRAM POSITIVE COCCI IDENTIFICATION AND SUSCEPTIBILITIES TO FOLLOW    Report Status PENDING  Incomplete  MRSA PCR Screening     Status: Abnormal   Collection Time: 12/13/14  3:00 PM  Result Value Ref Range Status   MRSA by PCR POSITIVE (A) NEGATIVE Final    Comment:        The GeneXpert MRSA Assay (FDA approved for NASAL specimens only), is one component of a comprehensive MRSA colonization surveillance program. It is not intended to diagnose MRSA infection nor to guide or monitor treatment for MRSA infections. CRITICAL RESULT CALLED TO, READ BACK BY AND VERIFIED WITH:  DALE HOPKINS AT 2117 12/13/14 SDR   Culture, blood (routine x 2)     Status: None   Collection Time: 12/13/14  5:25 PM  Result Value Ref Range Status   Specimen Description BLOOD LEFT WRIST  Final   Special Requests   Final    BOTTLES DRAWN AEROBIC AND ANAEROBIC  1CC AERO, 2CC ANAERO   Culture NO GROWTH 5 DAYS  Final   Report Status 12/18/2014 FINAL  Final  Culture, blood (routine x 2)     Status: None   Collection Time: 12/13/14  6:39 PM  Result Value Ref Range Status   Specimen Description BLOOD RIGHT ARM  Final  Special Requests BOTTLES DRAWN AEROBIC AND ANAEROBIC [facility]  Final   Culture NO GROWTH 5 DAYS  Final   Report Status 12/18/2014 FINAL  Final    Radiology Reports Dg Chest 1 View  12/14/2014  CLINICAL DATA:  Acute onset of shortness of breath and hypotension. Initial encounter. EXAM: CHEST 1 VIEW COMPARISON:  Chest radiograph performed 12/13/2014  FINDINGS: The lungs are hypoexpanded. A small left pleural effusion is suspected. Mildly increased central lung markings could reflect minimal interstitial edema or possibly pneumonia. No pneumothorax is seen. The cardiomediastinal silhouette is borderline normal in size. No acute osseous abnormalities are seen. A left shoulder arthroplasty is incompletely imaged, but appears grossly unremarkable. IMPRESSION: Lungs hypoexpanded. Suspect small left pleural effusion. Mildly increased central lung markings may reflect minimal interstitial edema or possibly pneumonia. Electronically Signed   By: Roanna Raider M.D.   On: 12/14/2014 01:58   Dg Chest 2 View  12/13/2014  CLINICAL DATA:  Altered mental status, pale, diaphoretic. EXAM: CHEST  2 VIEW COMPARISON:  Chest x-ray of October 09, 2013 FINDINGS: The lungs are hypoinflated. There is crowding of the pulmonary vascularity. The cardiac silhouette is enlarged. The pulmonary vascularity is indistinct. There is no pleural effusion. 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 IMPRESSION: Bilateral pulmonary hypoinflation. Stable enlargement cardiac silhouette. No definite pulmonary edema or pneumonia allowing for vascular crowding. Electronically Signed   By: David  [place] M.D.   On: 12/13/2014 09:17   Ct Head Wo Contrast  12/13/2014  CLINICAL DATA:  Altered mental status. EXAM: CT HEAD WITHOUT CONTRAST TECHNIQUE: Contiguous axial images were obtained from the base of the skull through the vertex without intravenous contrast. COMPARISON:  10/08/2013 FINDINGS: Skull and Sinuses:Negative for fracture or destructive process. The visualized mastoids, middle ears, and imaged paranasal sinuses are clear. Orbits: Scleral band on the right.  No acute finding. Brain: No evidence of acute infarction, hemorrhage, hydrocephalus, or mass lesion/mass effect. IMPRESSION: Negative head CT. Electronically Signed   By: Marnee Spring M.D.   On: 12/13/2014 08:33   US Renal  12/13/2014   CLINICAL DATA:  49 year old female with acute renal failure. Initial encounter. EXAM: RENAL / URINARY TRACT ULTRASOUND COMPLETE COMPARISON:  10/11/2013. FINDINGS: Right Kidney: Evaluation limited by patient's habitus and patient unable to cooperate. Atrophic lobular appearance of the right kidney without hydronephrosis. 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 If further delineation of the right kidney is clinically desired, CT imaging (can be performed without contrast) may be considered. Left Kidney: Length: 9.2 cm. Echogenicity within normal limits. No mass or hydronephrosis visualized. Bladder: Appears normal for degree of bladder distention. IMPRESSION: Exam limited.  Particularly limited evaluation of the right kidney. Right kidney appears atrophic and lobulated. Follow-up as noted above. No hydronephrosis detected. Electronically Signed   By: Lacy Duverney M.D.   On: 12/13/2014 11:02     CBC  Recent Labs Lab 12/13/14 1511 12/13/14 1839 12/14/14 0437 12/16/14 0455 12/17/14 0509  WBC 5.5 4.3 9.3 3.7 6.7  HGB 7.8* 7.6* 9.1* 7.8* 9.4*  HCT 25.1* 23.2* 27.4* 23.5* 30.3*  PLT 216 191 239 136* 152  MCV 88.4 86.6 86.1 86.2 87.4  MCH 27.5 28.3 28.6 28.6 27.3  MCHC 31.2* 32.6 33.3 33.2 31.2*  RDW 17.3* 17.1* 17.3* 15.9* 16.2*    Chemistries   Recent Labs Lab 12/13/14 0752 12/13/14 1511 12/13/14 1839  12/14/14 2204 12/15/14 0455 12/15/14 1311 12/16/14 0455 12/17/14 0509  NA 136 137 139  < > 136 138 139  142 142  K 6.6* >7.5* 7.4*  < > 5.1 5.1 4.5 4.4 4.6  CL 104 107 106  < > 104 104 107 109 109  CO2 13* 16* 19*  < > 19* 25 25 26 22   GLUCOSE 103* 78 97  < > 159* 137* 115* 96 92  BUN 63* 67* 68*  < > 39* 36* 32* 27* 21*  CREATININE 7.53* 7.46*  7.70* 7.54*  < > 2.85* 2.16* 1.79* 1.37* 1.18*  CALCIUM 8.3* 8.0* 7.8*  < > 8.3* 8.4* 8.4* 8.4* 8.6*  MG  --  2.3 2.1  --   --   --   --  1.9 2.2  AST 32 29  --   --   --   --  24  --   --   ALT 16 15  --   --   --    --  15  --   --   ALKPHOS 105 92  --   --   --   --  71  --   --   BILITOT 0.9 0.9  --   --   --   --  0.8  --   --   < > = values in this interval not displayed. ------------------------------------------------------------------------------------------------------------------ estimated creatinine clearance is 68.4 mL/min (by C-G formula based on Cr of 1.18). ------------------------------------------------------------------------------------------------------------------ No results for input(s): HGBA1C in the last 72 hours. ------------------------------------------------------------------------------------------------------------------ No results for input(s): CHOL, HDL, LDLCALC, TRIG, CHOLHDL, LDLDIRECT in the last 72 hours. ------------------------------------------------------------------------------------------------------------------ No results for input(s): TSH, T4TOTAL, T3FREE, THYROIDAB in the last 72 hours.  Invalid input(s): FREET3 ------------------------------------------------------------------------------------------------------------------ No results for input(s): VITAMINB12, FOLATE, FERRITIN, TIBC, IRON, RETICCTPCT in the last 72 hours.  Coagulation profile No results for input(s): INR, PROTIME in the last 168 hours.  No results for input(s): DDIMER in the last 72 hours.  Cardiac Enzymes No results for input(s): CKMB, TROPONINI, MYOGLOBIN in the last 168 hours.  Invalid input(s): CK ------------------------------------------------------------------------------------------------------------------ Invalid input(s): POCBNP    Assessment & Plan   IMPRESSION AND PLAN: Patient is a 49 year old white female brought in by family due to confusion 1. Acute encephalopathy: The combination of acute renal failure and ATN.   Encephalopathy resolved. Off percedex drip . Watch closely for further agitation. Nausea,vomting diarrhea: Possible viral gastroenteritis:  resolved.  Start on full liquid diet. Discontinue IV hydration.  2. Hypotension could be related to urinary tract infection and sepsis.[Resolved. Off the pressors.  3. Acute renal failure with severe hyperkalemia;, metabolic acidosis with metabolic encephalopathy:patient received bicarbonate drip. It is post emergency hemodialysis. Renal function improved with good urine output.possible rhabdomyolysis on admission  Along with NSAId use;was taking diclofenac gel,but CPK was not checked. Renal Failure to improved. Discontinue Foley, physical therapy consult today.  Potassium is better, renal function is better today. 4.suspected  Urinary tract infection:continue abx. 6.  Bipolar disorder hold all her psychiatric medications; psych consulted  Possibly need to restart psych medications including seroquel/ Lexapro.       Code Status Orders        Start     Ordered   12/13/14 1510  Full code   Continuous     12/13/14 1509           Consults  nephrologlo  DVT Prophylaxis heparin  Lab Results  Component Value Date   PLT 152 12/17/2014     Time Spent in minutes ; 35 minutes.  Katha Hamming M.D on 12/18/2014 at 10:49 AM  Between 7am  to 6pm - Pager - [phone]  After 6pm go to [email or web address] - password [email or web address] [facility]  [facility]   Office  [phone]

## 2014-12-18 NOTE — Progress Notes (Signed)
Initial Nutrition Assessment      INTERVENTION:  Meals and snacks: Cater to pt preferences, await progression of diet. Recommend regular diet once able to tolerate solid foods.  Medical Nutrition Supplement Therapy: If unable to meet nutritional needs will add supplement   NUTRITION DIAGNOSIS:    (None at this time) related to   as evidenced by  .    GOAL:   Patient will meet greater than or equal to 90% of their needs    MONITOR:    (Energy intake, Electrolyte and renal profile)  REASON FOR ASSESSMENT:   LOS    ASSESSMENT:      Pt admitted with acute encephalopathy, ARF, hyperkalemia with emergent HD done, UTI, abdominal pain  Past Medical History  Diagnosis Date  . Other chronic pain   . Restless legs syndrome (RLS)   . Disorder of bone and cartilage, unspecified   . Unspecified essential hypertension   . Esophageal reflux   . Personal history of malignant neoplasm of cervix uteri   . Irritable bowel syndrome   . Contact dermatitis and other eczema, due to unspecified cause   . Lumbago   . Migraine, unspecified, without mention of intractable migraine without mention of status migrainosus   . Unspecified schizophrenia, unspecified condition   . Personal history of colonic polyps   . Symptomatic menopausal or female climacteric states   . Other and unspecified hyperlipidemia   . Pain in joint, site unspecified   . Hypertonicity of bladder   . Pain in joint, forearm   . History of renal failure and respiratory acidosis (05/24/2014) 11/20/2014    Current Nutrition: eating sherbet during visit, limited recorded po intake per I and O sheet. Pt reports intake has been good during admission  Food/Nutrition-Related History: pt reports normal intake prior to admission   Scheduled Medications:  . escitalopram  10 mg Oral Daily  . heparin  5,000 Units Subcutaneous 3 times per day  . insulin aspart  5 Units Intravenous Once  . meropenem (MERREM) IV  500 mg  Intravenous 3 times per day  . midazolam  2 mg Intravenous Once  . mupirocin ointment  1 application Nasal BID  . pantoprazole (PROTONIX) IV  40 mg Intravenous Q12H  . QUEtiapine  600 mg Oral QHS  . risperiDONE  1 mg Oral QHS  . sodium chloride  3 mL Intravenous Q12H        Electrolyte/Renal Profile and Glucose Profile:   Recent Labs Lab 12/13/14 1839  12/15/14 1311 12/16/14 0455 12/17/14 0509  NA 139  < > 139 142 142  K 7.4*  < > 4.5 4.4 4.6  CL 106  < > 107 109 109  CO2 19*  < > 25 26 22   BUN 68*  < > 32* 27* 21*  CREATININE 7.54*  < > 1.79* 1.37* 1.18*  CALCIUM 7.8*  < > 8.4* 8.4* 8.6*  MG 2.1  --   --  1.9 2.2  PHOS 9.6*  --   --  2.9 3.8  GLUCOSE 97  < > 115* 96 92  < > = values in this interval not displayed. Protein Profile:  Recent Labs Lab 12/13/14 0752 12/13/14 1511 12/15/14 1311  ALBUMIN 3.0* 2.7* 2.4*    Gastrointestinal Profile: Last BM:12/13    Weight Change: stable wt per pt.      Diet Order:  Diet full liquid Room service appropriate?: Yes; Fluid consistency:: Thin  Skin:   reviewed  Height:   Ht Readings from Last 1 Encounters:  12/13/14  (1.676 m)    Weight:   Wt Readings from Last 1 Encounters:  12/13/14 227 lb 4.7 oz (103.1 kg)    Ideal Body Weight:     BMI:  Body mass index is 36.7 kg/(m^2).   EDUCATION NEEDS:   No education needs identified at this time  LOW Care Level  Jamarion [place] B. Freida Busman, RD, LDN [phone] (pager)

## 2014-12-19 ENCOUNTER — Ambulatory Visit: Payer: Medicare Other | Admitting: Pain Medicine

## 2014-12-19 LAB — URINE CULTURE

## 2014-12-19 MED ORDER — NITROFURANTOIN MONOHYD MACRO 100 MG PO CAPS
100.0000 mg | ORAL_CAPSULE | Freq: Two times a day (BID) | ORAL | Status: DC
  Administered 2014-12-19 – 2014-12-23 (×9): 100 mg via ORAL
  Filled 2014-12-19 (×12): qty 1

## 2014-12-19 MED ORDER — PANTOPRAZOLE SODIUM 40 MG PO TBEC
40.0000 mg | DELAYED_RELEASE_TABLET | Freq: Every day | ORAL | Status: DC
  Administered 2014-12-20 – 2014-12-23 (×4): 40 mg via ORAL
  Filled 2014-12-19 (×4): qty 1

## 2014-12-19 NOTE — Progress Notes (Signed)
Speech Therapy Note: reviewed chart notes; consulted MD re: pt's status today. No vomiting has been reported today per MD and the ok to upgrade to foods was given by MD. Met w/ pt who stated she was "still full" from breakfast this AM(full liquid diet then). She declined current po's but stated she would look on the menu and choose dinner meal items. Pt denied any trouble swallowing liquids today. ST will f/u w/ pt in AM for toleration of diet. Briefly discussed aspiration precautions including sitting upright to eat/drink and taking small bites/sips and eating slowly - rest breaks for digestion. Pt agreed. NSG updated.

## 2014-12-19 NOTE — Progress Notes (Signed)
Pharmacy Antibiotic Follow-up Note  [name] MainsKendra A Haas is a 49 y.o. year-old female admitted on 12/13/2014.  The patient is currently on day 7 of meropenem for UTI.  Assessment/Plan: Urine culture with VRE, patient currently on meropenem. See sensitivities. Discussed with Dr. Luberta MutterKonidena. Patient with allergy to pencillin (angioedema). MD interested in linezolid, however ID consult required and patient on multiple psych meds which will cause drug interreactions. Will discontinue meropenem and start macrobid at this time, MD to obtain ID consult.     Temp (24hrs), Avg:98.4 F (36.9 C), Min:98.2 F (36.8 C), Max:98.5 F (36.9 C)   Recent Labs Lab 12/13/14 1511 12/13/14 1839 12/14/14 0437 12/16/14 0455 12/17/14 0509  WBC 5.5 4.3 9.3 3.7 6.7    Recent Labs Lab 12/14/14 2204 12/15/14 0455 12/15/14 1311 12/16/14 0455 12/17/14 0509  CREATININE 2.85* 2.16* 1.79* 1.37* 1.18*   Estimated Creatinine Clearance: 68.4 mL/min (by C-G formula based on Cr of 1.18).    Allergies  Allergen Reactions  . Alcohol Swelling    overall swelling  . Bextra [Valdecoxib]   . Celebrex [Celecoxib]   . Penicillins     Angioedema & rash  . Sulfa Antibiotics   . Sulfacetamide Sodium   . Vioxx [Rofecoxib]   . Metformin Rash    Sores, ulcers    Antimicrobials this admission: Meropenem 12/8 >> 12/14 Nitrofurantion 12/14>>  Microbiology results: 12/8 BCx: NG final x 2 12/8 UCx: > 100k  E faecalis, vancomycin resistant - sensitive to ampicillin, linezolid, nitrofurantoin 12/8 MRSA PCR: positive  Thank you for allowing pharmacy to be a part of this patient's care.  Garlon HatchetJody Siobahn Worsley, PharmD Clinical Pharmacist  12/19/2014 1:10 PM

## 2014-12-19 NOTE — Progress Notes (Signed)
PT Cancellation Note  Patient Details Name: Lita MainsKendra A Reiling MRN: [id number] DOB: [birth year]   Cancelled Treatment:    Reason Eval/Treat Not Completed: Other (comment). Pt continues with temp R dialysis catheter. Per hospital policy, limited to bed activities only. Spoke with MD about possible removal in near future. MD to follow up with recommendations. Will hold therapy at this time until further orders received.   Kiaan Overholser 12/19/2014, 2:22 PM  Elizabeth PalauStephanie Jebadiah Imperato, PT, DPT [phone]

## 2014-12-19 NOTE — Progress Notes (Signed)
[facility] [place] [facility]                                                                                                                                                                                            Patient Demographics   [name] Haas, is a 48 y.o. female, DOB - 1965/12/18, UJW:[id number]  Admit date - 12/13/2014   Admitting Physician Auburn Bilberry, MD  Outpatient Primary MD for the patient is [name], Johnney Killian, MD   LOS - 6  C/o of left shoulder pain, right foot drop. She says she has chronic left shoulder pain, right foot pain and sees Dr. Paulette Blanch that and supposed to have MRI of the lumbosacral spine for evaluation of the right foot drop.  She is tolerating the diet. Review of Systems:   CONSTITUTIONAL:not  confused anymore.  Review of systems:  Awake alert oriented Cardiovascular; no chest pain  Lungs; no cough, shortness of breath. GI: no  Nausea, no vomiting, no abdominal pain, no diarrhea. Neurological: No headache, no double vision, no weakness in the hands or legs. Psych ;patient right now is not agitated. Extremities: Right foot drop observed. She says she had it for the last 2-3 months. Vitals:   Filed Vitals:   12/18/14 1210 12/18/14 1313 12/18/14 2027 12/19/14 0348  BP: 92/73 140/50 145/66 141/61  Pulse: 90 58 79 87  Temp: 98.9 F (37.2 C) 98.2 F (36.8 C) 98.5 F (36.9 C)   TempSrc: Oral Oral Oral   Resp: 18 18 18    Height:      Weight:      SpO2: 96% 95% 97%     Wt Readings from Last 3 Encounters:  12/13/14 103.1 kg (227 lb 4.7 oz)  11/19/14 96.163 kg (212 lb)  06/07/14 100.336 kg (221 lb 3.2 oz)     Intake/Output Summary (Last 24 hours) at 12/19/14 1239 Last data filed at 12/19/14 1100  Gross per 24 hour  Intake    880 ml  Output   1729 ml  Net   -849 ml    Physical Exam:   GENERAL: Critically ill-appearing female HEAD, EYES, EARS, NOSE AND THROAT: Atraumatic, normocephalic. Extraocular  muscles are intact. Pupils equal and reactive to light. Sclerae anicteric. No conjunctival injection. No oro-pharyngeal erythema.  NECK: Supple. There is no jugular venous distention. No bruits, no lymphadenopathy, no thyromegaly.  HEART: Regular rate and rhythm,. No murmurs, no rubs, no clicks.  LUNGS: Clear to auscultation bilaterally. No rales or rhonchi. No wheezes.  ABDOMEN: Soft, flat, nontender, nondistended. Has good bowel sounds. No hepatosplenomegaly appreciated.  EXTREMITIES: No evidence  of any cyanosis, clubbing, or peripheral edema.  +2 pedal and radial pulses bilaterally.  Neuro; cranial nerves II-12 grossly intact strength 5 over 5 in all 4 extremities  SKIN: Moist and warm with no rashes appreciated.  Psych: Not anxious, depressed LN: No inguinal LN enlargement    Antibiotics   Anti-infectives    Start     Dose/Rate Route Frequency Ordered Stop   12/16/14 1500  meropenem (MERREM) 500 mg in sodium chloride 0.9 % 50 mL IVPB  Status:  Discontinued     500 mg 100 mL/hr over 30 Minutes Intravenous 3 times per day 12/16/14 1007 12/19/14 1204   12/13/14 1445  meropenem (MERREM) 500 mg in sodium chloride 0.9 % 50 mL IVPB  Status:  Discontinued     500 mg 100 mL/hr over 30 Minutes Intravenous Every 12 hours 12/13/14 1438 12/16/14 1007   12/13/14 1400  meropenem (MERREM) 1 g in sodium chloride 0.9 % 100 mL IVPB  Status:  Discontinued     1 g 200 mL/hr over 30 Minutes Intravenous 3 times per day 12/13/14 1002 12/13/14 1437   12/13/14 0930  ciprofloxacin (CIPRO) IVPB 400 mg     400 mg 200 mL/hr over 60 Minutes Intravenous  Once 12/13/14 0923 12/13/14 1040      Medications   Scheduled Meds: . dicyclomine  20 mg Oral Daily  . escitalopram  10 mg Oral Daily  . heparin  5,000 Units Subcutaneous 3 times per day  . insulin aspart  5 Units Intravenous Once  . midazolam  2 mg Intravenous Once  . nitrofurantoin (macrocrystal-monohydrate)  100 mg Oral Q12H  . [START ON 12/20/2014]  pantoprazole  40 mg Oral Daily  . QUEtiapine  600 mg Oral QHS  . risperiDONE  1 mg Oral QHS  . sodium chloride  3 mL Intravenous Q12H   Continuous Infusions:   PRN Meds:.acetaminophen **OR** acetaminophen, haloperidol, morphine injection, ondansetron (ZOFRAN) IV, ziprasidone   Data Review:   Micro Results Recent Results (from the past 240 hour(s))  Urine culture     Status: None   Collection Time: 12/13/14  8:30 AM  Result Value Ref Range Status   Specimen Description URINE, CLEAN CATCH  Final   Special Requests NONE  Final   Culture   Final    >=100,000 COLONIES/mL ENTEROCOCCUS FAECALIS VANCOMYCIN RESISTANT ENTEROCOCCUS CRITICAL RESULT CALLED TO, READ BACK BY AND VERIFIED WITH: Adelina Mings AT 1610 ON 12/19/14 CTJ    Report Status 12/19/2014 FINAL  Final   Organism ID, Bacteria ENTEROCOCCUS FAECALIS  Final      Susceptibility   Enterococcus faecalis - MIC*    AMPICILLIN <=2 SENSITIVE Sensitive     NITROFURANTOIN <=16 SENSITIVE Sensitive     LINEZOLID 2 SENSITIVE Sensitive     CIPROFLOXACIN Value in next row Resistant      RESISTANT>=8    LEVOFLOXACIN Value in next row Resistant      RESISTANT>=8    TETRACYCLINE Value in next row Resistant      RESISTANT>=16    VANCOMYCIN Value in next row Resistant      RESISTANT>=16    GENTAMICIN SYNERGY Value in next row Resistant      RESISTANT>=16    * >=100,000 COLONIES/mL ENTEROCOCCUS FAECALIS  MRSA PCR Screening     Status: Abnormal   Collection Time: 12/13/14  3:00 PM  Result Value Ref Range Status   MRSA by PCR POSITIVE (A) NEGATIVE Final    Comment:  The GeneXpert MRSA Assay (FDA approved for NASAL specimens only), is one component of a comprehensive MRSA colonization surveillance program. It is not intended to diagnose MRSA infection nor to guide or monitor treatment for MRSA infections. CRITICAL RESULT CALLED TO, READ BACK BY AND VERIFIED WITH:  DALE HOPKINS AT 2117 12/13/14 SDR   Culture, blood (routine x  2)     Status: None   Collection Time: 12/13/14  5:25 PM  Result Value Ref Range Status   Specimen Description BLOOD LEFT WRIST  Final   Special Requests   Final    BOTTLES DRAWN AEROBIC AND ANAEROBIC  1CC AERO, 2CC ANAERO   Culture NO GROWTH 5 DAYS  Final   Report Status 12/18/2014 FINAL  Final  Culture, blood (routine x 2)     Status: None   Collection Time: 12/13/14  6:39 PM  Result Value Ref Range Status   Specimen Description BLOOD RIGHT ARM  Final   Special Requests BOTTLES DRAWN AEROBIC AND ANAEROBIC 7CC  Final   Culture NO GROWTH 5 DAYS  Final   Report Status 12/18/2014 FINAL  Final    Radiology Reports Dg Chest 1 View  12/14/2014  CLINICAL DATA:  Acute onset of shortness of breath and hypotension. Initial encounter. EXAM: CHEST 1 VIEW COMPARISON:  Chest radiograph performed 12/13/2014 FINDINGS: The lungs are hypoexpanded. A small left pleural effusion is suspected. Mildly increased central lung markings could reflect minimal interstitial edema or possibly pneumonia. No pneumothorax is seen. The cardiomediastinal silhouette is borderline normal in size. No acute osseous abnormalities are seen. A left shoulder arthroplasty is incompletely imaged, but appears grossly unremarkable. IMPRESSION: Lungs hypoexpanded. Suspect small left pleural effusion. Mildly increased central lung markings may reflect minimal interstitial edema or possibly pneumonia. Electronically Signed   By: Roanna RaiderJeffery  Chang M.D.   On: 12/14/2014 01:58   Dg Chest 2 View  12/13/2014  CLINICAL DATA:  Altered mental status, pale, diaphoretic. EXAM: CHEST  2 VIEW COMPARISON:  Chest x-ray of October 09, 2013 FINDINGS: The lungs are hypoinflated. There is crowding of the pulmonary vascularity. The cardiac silhouette is enlarged. The pulmonary vascularity is indistinct. There is no pleural effusion. 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 IMPRESSION: Bilateral pulmonary hypoinflation. Stable enlargement cardiac silhouette. No definite  pulmonary edema or pneumonia allowing for vascular crowding. Electronically Signed   By: David  SwazilandJordan M.D.   On: 12/13/2014 09:17   Ct Head Wo Contrast  12/13/2014  CLINICAL DATA:  Altered mental status. EXAM: CT HEAD WITHOUT CONTRAST TECHNIQUE: Contiguous axial images were obtained from the base of the skull through the vertex without intravenous contrast. COMPARISON:  10/08/2013 FINDINGS: Skull and Sinuses:Negative for fracture or destructive process. The visualized mastoids, middle ears, and imaged paranasal sinuses are clear. Orbits: Scleral band on the right.  No acute finding. Brain: No evidence of acute infarction, hemorrhage, hydrocephalus, or mass lesion/mass effect. IMPRESSION: Negative head CT. Electronically Signed   By: Marnee SpringJonathon  Watts M.D.   On: 12/13/2014 08:33   [place] Renal  12/13/2014  CLINICAL DATA:  49 year old female with acute renal failure. Initial encounter. EXAM: RENAL / URINARY TRACT ULTRASOUND COMPLETE COMPARISON:  10/11/2013. FINDINGS: Right Kidney: Evaluation limited by patient's habitus and patient unable to cooperate. Atrophic lobular appearance of the right kidney without hydronephrosis. 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 If further delineation of the right kidney is clinically desired, CT imaging (can be performed without contrast) may be considered. Left Kidney: Length: 9.2 cm. Echogenicity within normal  limits. No mass or hydronephrosis visualized. Bladder: Appears normal for degree of bladder distention. IMPRESSION: Exam limited.  Particularly limited evaluation of the right kidney. Right kidney appears atrophic and lobulated. Follow-up as noted above. No hydronephrosis detected. Electronically Signed   By: Lacy Duverney M.D.   On: 12/13/2014 11:02     CBC  Recent Labs Lab 12/13/14 1511 12/13/14 1839 12/14/14 0437 12/16/14 0455 12/17/14 0509  WBC 5.5 4.3 9.3 3.7 6.7  HGB 7.8* 7.6* 9.1* 7.8* 9.4*  HCT 25.1* 23.2* 27.4* 23.5* 30.3*   PLT 216 191 239 136* 152  MCV 88.4 86.6 86.1 86.2 87.4  MCH 27.5 28.3 28.6 28.6 27.3  MCHC 31.2* 32.6 33.3 33.2 31.2*  RDW 17.3* 17.1* 17.3* 15.9* 16.2*    Chemistries   Recent Labs Lab 12/13/14 0752 12/13/14 1511 12/13/14 1839  12/14/14 2204 12/15/14 0455 12/15/14 1311 12/16/14 0455 12/17/14 0509  NA 136 137 139  < > 136 138 139 142 142  K 6.6* >7.5* 7.4*  < > 5.1 5.1 4.5 4.4 4.6  CL 104 107 106  < > 104 104 107 109 109  CO2 13* 16* 19*  < > 19* 25 25 26 22   GLUCOSE 103* 78 97  < > 159* 137* 115* 96 92  BUN 63* 67* 68*  < > 39* 36* 32* 27* 21*  CREATININE 7.53* 7.46*  7.70* 7.54*  < > 2.85* 2.16* 1.79* 1.37* 1.18*  CALCIUM 8.3* 8.0* 7.8*  < > 8.3* 8.4* 8.4* 8.4* 8.6*  MG  --  2.3 2.1  --   --   --   --  1.9 2.2  AST 32 29  --   --   --   --  24  --   --   ALT 16 15  --   --   --   --  15  --   --   ALKPHOS 105 92  --   --   --   --  71  --   --   BILITOT 0.9 0.9  --   --   --   --  0.8  --   --   < > = values in this interval not displayed. ------------------------------------------------------------------------------------------------------------------ estimated creatinine clearance is 68.4 mL/min (by C-G formula based on Cr of 1.18). ------------------------------------------------------------------------------------------------------------------ No results for input(s): HGBA1C in the last 72 hours. ------------------------------------------------------------------------------------------------------------------ No results for input(s): CHOL, HDL, LDLCALC, TRIG, CHOLHDL, LDLDIRECT in the last 72 hours. ------------------------------------------------------------------------------------------------------------------ No results for input(s): TSH, T4TOTAL, T3FREE, THYROIDAB in the last 72 hours.  Invalid input(s): FREET3 ------------------------------------------------------------------------------------------------------------------ No results for input(s): VITAMINB12,  FOLATE, FERRITIN, TIBC, IRON, RETICCTPCT in the last 72 hours.  Coagulation profile No results for input(s): INR, PROTIME in the last 168 hours.  No results for input(s): DDIMER in the last 72 hours.  Cardiac Enzymes No results for input(s): CKMB, TROPONINI, MYOGLOBIN in the last 168 hours.  Invalid input(s): CK ------------------------------------------------------------------------------------------------------------------ Invalid input(s): POCBNP    Assessment & Plan   IMPRESSION AND PLAN: Patient is a 49 year old white female brought in by family due to confusion 1. Acute encephalopathy: The combination of acute renal failure and ATN.   Encephalopathy resolved.  Nausea,vomting diarrhea: Possible viral gastroenteritis:  resolved. Start on regular diet.  2. Hypotension could be related to urinary tract infection and sepsis. Sepsis secondary to VRE in the urine. Will request ID consult to start the patient on Zyvox. Continue isolation.  3. Acute renal failure with severe hyperkalemia;, metabolic acidosis  with metabolic encephalopathy:patient received bicarbonate drip. It is post emergency hemodialysis. Renal function improved with good urine output.possible rhabdomyolysis on admission  Along with NSAId use;was taking diclofenac gel,but CPK was not checked. Renal Failure to improved. Discontinue Foley, physical therapy consult today.   Potassium is better, renal function is better today.   6.  Bipolar disorder ; restarted her medications, preceded psychiatric following the patient.   7.  IBS: Continue Bentyl. #8 history of gastritis and duodenitis before she is on PPIs.   History of left shoulder arthroscopy before. 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 Patient had history of left tibia and fibula ORIF in June 2016 . t also noted to have a right distal fifth metatarsal shaft fracture. MRI of LS spine to evaluate for  right foot drop.  Spent about 25 minutes in reviewing the old records.     Code Status Orders        Start     Ordered   12/13/14 1510  Full code   Continuous     12/13/14 1509           Consults  nephrologlo  DVT Prophylaxis heparin  Lab Results  Component Value Date   PLT 152 12/17/2014     Time Spent in minutes ; 35 minutes.plus 25 min  reviwe of old records.  Katha Hamming M.D on 12/19/2014 at 12:39 PM  Between 7am to 6pm - Pager - [phone]  After 6pm go to [email or web address] - password [email or web address] [facility]  [facility]   Office  [phone]

## 2014-12-19 NOTE — Care Management Important Message (Signed)
Important Message  Patient Details  Name: Lita MainsKendra A Parlee MRN: [id number] Date of Birth: Jun 14, 1965   Medicare Important Message Given:  Yes    Adonis HugueninBerkhead, Evalene Vath L, RN 12/19/2014, 9:41 AM

## 2014-12-19 NOTE — Progress Notes (Signed)
Pt rested comfortably over night.  Pt did have one episode of emesis, 200cc which was relieved by zofran.  Pt complained one time of left shoulder pain, morphine was given with relief.  Pt remained alert and oriented.

## 2014-12-20 ENCOUNTER — Ambulatory Visit: Payer: Medicare Other

## 2014-12-20 ENCOUNTER — Inpatient Hospital Stay: Payer: Medicare Other

## 2014-12-20 DIAGNOSIS — F203 Undifferentiated schizophrenia: Secondary | ICD-10-CM

## 2014-12-20 LAB — CBC
HEMATOCRIT: 24.8 % — AB (ref 35.0–47.0)
Hemoglobin: 8 g/dL — ABNORMAL LOW (ref 12.0–16.0)
MCH: 27.1 pg (ref 26.0–34.0)
MCHC: 32.1 g/dL (ref 32.0–36.0)
MCV: 84.3 fL (ref 80.0–100.0)
Platelets: 128 10*3/uL — ABNORMAL LOW (ref 150–440)
RBC: 2.94 MIL/uL — ABNORMAL LOW (ref 3.80–5.20)
RDW: 15.8 % — AB (ref 11.5–14.5)
WBC: 6.6 10*3/uL (ref 3.6–11.0)

## 2014-12-20 MED ORDER — OXYCODONE-ACETAMINOPHEN 5-325 MG PO TABS: 1.0000 | ORAL_TABLET | Freq: Four times a day (QID) | ORAL | Status: DC | PRN

## 2014-12-20 MED ORDER — OXYCODONE HCL 5 MG PO TABS
5.0000 mg | ORAL_TABLET | Freq: Four times a day (QID) | ORAL | Status: DC | PRN
Start: 2014-12-20 — End: 2014-12-23
  Administered 2014-12-20 – 2014-12-23 (×10): 5 mg via ORAL
  Filled 2014-12-20 (×11): qty 1

## 2014-12-20 MED ORDER — LORAZEPAM 2 MG/ML IJ SOLN: 1.0000 mg | Freq: Once | INTRAMUSCULAR | Status: DC

## 2014-12-20 MED ORDER — NITROFURANTOIN MACROCRYSTAL 100 MG PO CAPS
100.0000 mg | ORAL_CAPSULE | Freq: Two times a day (BID) | ORAL | Status: DC
  Filled 2014-12-20 (×2): qty 1

## 2014-12-20 MED ORDER — ESCITALOPRAM OXALATE 10 MG PO TABS
10.0000 mg | ORAL_TABLET | Freq: Every day | ORAL | Status: DC
  Administered 2014-12-20 – 2014-12-23 (×4): 10 mg via ORAL
  Filled 2014-12-20 (×4): qty 1

## 2014-12-20 MED ORDER — LORAZEPAM 1 MG PO TABS
1.0000 mg | ORAL_TABLET | Freq: Once | ORAL | Status: AC
  Administered 2014-12-20: 1 mg via ORAL
  Filled 2014-12-20: qty 1

## 2014-12-20 MED ORDER — OXYCODONE-ACETAMINOPHEN 5-325 MG PO TABS
1.0000 | ORAL_TABLET | Freq: Four times a day (QID) | ORAL | Status: DC | PRN
Start: 2014-12-20 — End: 2014-12-23
  Administered 2014-12-20 – 2014-12-23 (×10): 1 via ORAL
  Filled 2014-12-20 (×11): qty 1

## 2014-12-20 NOTE — Progress Notes (Signed)
PT Cancellation Note  Patient Details Name: [name] MainsKendra A Haas MRN: [id number] DOB: 1965/04/04   Cancelled Treatment:    Reason Eval/Treat Not Completed: Other (comment) (Temp fem cath contraindication)  Spoke with RN who is planning to take temp femoral cath out today.  Will check back as time allows.  Darya Bigler A [name], PT 12/20/2014, 12:06 PM

## 2014-12-20 NOTE — Progress Notes (Signed)
All bedtime pills were given to patient and she swallowed them without choking. Five min after swallowing pills patient had an episode of vomiting. Vomit consisted of coke which was what the patient used to swallow her pills. No food noted to be included in vomit. Patient was given Zofran with positive results.

## 2014-12-20 NOTE — Progress Notes (Signed)
RN has contacted PT, due to removal of trialysis to see if they wanted to work with pt. PT was not avaible to work with pt and said they will see pt first thing in the morning.  Karsten RoLauren E Hobbs

## 2014-12-20 NOTE — Care Management (Signed)
Spoke with Dr Luberta MutterKonidena over the phone concerning Trialysis catheter. Had discussed case with dr Cherylann RatelLateef as well. OK to remove Trialysis catheter today per Dr Luberta MutterKonidena. Telephone order given. Informed patient Nurse Leotis ShamesLauren and also Lenell AntuJosh S RN who will assist in care. Order also noted in Dr Devin GoingKonidenas Progress notes.

## 2014-12-20 NOTE — Consult Note (Signed)
[facility] Face-to-Face Psychiatry Consult   Reason for Consult:  Consult for 49 year old woman with a past history of schizophrenia who is currently in the hospital because of acute renal failure probably from infection. Consult for follow-up psychiatric treatment Referring Physician:  Luberta Mutter Patient Identification: [name] Haas MRN:  [id number] Principal Diagnosis: Schizophrenia Diagnosis:   Patient Active Problem List   Diagnosis Date Noted  . Acute encephalopathy [G93.40] 12/13/2014  . Acidemia [E87.2]   . Altered mental status [R41.82]   . ARF (acute renal failure) (HCC) [N17.9]   . Hyperkalemia [E87.5]   . UTI (lower urinary tract infection) [N39.0]   . Chronic left shoulder pain [M25.512, G89.29] 11/20/2014  . 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 Chronic low back pain [M54.5, G89.29] 11/20/2014  . History of renal failure and respiratory acidosis (05/24/2014) [Z87.448] 11/20/2014  . Chronic pain syndrome [G89.4] 11/20/2014  . Long term current use of opiate analgesic [Z79.891] 11/19/2014  . Long term prescription opiate use [Z79.899] 11/19/2014  . Opiate use [F11.90] 11/19/2014  . Opiate dependence (HCC) [F11.20] 11/19/2014  . Encounter for therapeutic drug level monitoring [Z51.81] 11/19/2014  . Lumbar radiculopathy, acute (Right L5 Radiculopathy. "Drop foot") [M54.16] 11/19/2014  . Myofascial pain [M79.1] 11/19/2014  . Neurogenic pain [M79.2] 11/19/2014  . Foot drop, right [M21.371] 11/19/2014  . Chronic pain of right lower extremity [M79.604, G89.29] 11/19/2014  . Encounter for surgical follow-up care [Z48.89] 11/08/2014  . Absolute anemia [D64.9] 07/06/2014  . Injury of kidney [S37.009A] 07/05/2014  . Fracture of shaft of tibia and fibula, open [S82.90XB] 07/05/2014  . Paranoid schizophrenia (HCC) [F20.0] 06/14/2014  . Back ache [M54.9] 06/14/2014  . Arm fracture [S42.309A] 06/14/2014  . Depression, major, recurrent, moderate (HCC) [F33.1] 06/14/2014  .  Aggrieved [F43.21] 06/14/2014  . 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 Essential hypertension [I10] 06/07/2014  . Pain in limb [M79.609] 06/07/2014  . HLD (hyperlipidemia) [E78.5] 06/07/2014  . Fracture of bone adjacent to prosthesis (HCC) [W09.9XXA] 02/13/2014  . Extreme obesity (HCC) [E66.01] 01/23/2014  . Morbid obesity (HCC) [E66.01] 01/23/2014  . Type 2 diabetes mellitus with hyperglycemia (HCC) [E11.65] 01/23/2014  . Chronic pain [G89.29] 11/20/2013  . BP (high blood pressure) [I10] 11/20/2013  . Headache, migraine [G43.909] 11/20/2013  . 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 Affective bipolar disorder (HCC) [F31.9] 03/15/2012  . Acid reflux [K21.9] 03/15/2012  . Adiposity [E66.9] 03/15/2012  . Compulsive tobacco user syndrome [F17.200] 03/15/2012  . Bipolar affective disorder (HCC) [F31.9] 03/15/2012  . Incomplete bladder emptying [R33.9] 09/22/2011  . Urge incontinence of urine [N39.41] 08/25/2011    Total Time spent with patient: 1 hour  Subjective:   [name] Haas is a 49 y.o. female patient admitted with "I'm feeling much better".  HPI:  Information from the patient and the chart. Patient interviewed. Chart reviewed. I'm also familiar with the patient from outpatient treatment. This 49 year old woman has a history of schizophrenia and has been seen by me for years in my outpatient office although I hadn't seen her in months. Probably a big part of that was because she was in rehabilitation. Patient currently is in the hospital because of acute confusion that appears to been related to renal failure from an infection. She is now feeling much better. She tells me today that her mood is feeling good. She denies feeling depressed. Has no suicidal or homicidal thoughts at all. She tells me she is actually not having any  of her hallucinations. She denies any auditory or visual or even tactile hallucinations  which used to be a major problem for her. She is not feeling paranoid. Patient is able to express and explain a good deal about her medical problems. Had a serious fracture to her left leg several months ago requiring quite a bit of rehabilitation. Has developed a dropped foot now on the right side. Then came into the hospital for infection but is cleared up. She says that she had been taking her Seroquel 600 mg at night and Lexapro as an outpatient. She has been continuing to take it since being in the hospital. She has no new complaints for me.  Social history: Patient lives with her father. Father is reportedly actually quite functional and often serves to take care of her. They have a good relationship.  Medical history: Patient has noted above had a terrible fracture to her left leg earlier in the year and is still recovering and now has a dropped foot on the right side for some reason. Also has a history of high blood pressure  Substance abuse history: Patient has had times in the past where she was on chronic narcotics third been some concern about whether this was fully appropriate but I have never seen the patient as having a major substance abuse problem and she denies that she's been using any substances inappropriately recently.  Past Psychiatric History: A she has a long history of psychotic disorder. When she is at her worst she has delusions that someone is molesting her by telepathy and she can feel it all over her body. Since she has been on antipsychotics recently she has been doing quite well. She has had past admissions to hospitals and suicide attempts in the past. No history of violence that I'm aware of. 600 mg Seroquel at night and 10 mg of Lexapro has been an effective medication. Patient has had problems in the past getting delirious when she was on benzodiazepines and we have tried to minimize that.  Risk to Self: Is patient at risk for suicide?: No Risk to Others:   Prior  Inpatient Therapy:   Prior Outpatient Therapy:    Past Medical History:  Past Medical History  Diagnosis Date  . Other chronic pain   . Restless legs syndrome (RLS)   . Disorder of bone and cartilage, unspecified   . Unspecified essential hypertension   . Esophageal reflux   . Personal history of malignant neoplasm of cervix uteri   . Irritable bowel syndrome   . Contact dermatitis and other eczema, due to unspecified cause   . Lumbago   . Migraine, unspecified, without mention of intractable migraine without mention of status migrainosus   . Unspecified schizophrenia, unspecified condition   . Personal history of colonic polyps   . Symptomatic menopausal or female climacteric states   . Other and unspecified hyperlipidemia   . Pain in joint, site unspecified   . Hypertonicity of bladder   . Pain in joint, forearm   . History of renal failure and respiratory acidosis (05/24/2014) 11/20/2014    Past Surgical History  Procedure Laterality Date  . Breast lumpectomy      left multiple benign  . Hand surgery  1988    left tendon transfer  . Vaginal hysterectomy  2005  . Bilateral oophorectomy  2005  . Cervical cone biopsy  2005  . Shoulder arthroscopy  2007    right  . Retinal detachment surgery  2010  right, repair   Family History:  Family History  Problem Relation Age of Onset  . Skin cancer Father     carcinoma, basal cell  . Melanoma Father   . Anemia Mother   . Drug abuse Brother   . Breast cancer Paternal Grandmother   . Diabetes type II Paternal Grandfather   . Other Father     hay fever  . Other Mother     epilepsy   Family Psychiatric  History: Family history he has some positivity for mood symptoms but she is unclear about all of it nothing else reported Social History:  History  Alcohol Use No     History  Drug Use No    Social History   Social History  . Marital Status: Single    Spouse Name: N/A  . Number of Children: N/A  . Years of  Education: N/A   Social History Main Topics  . Smoking status: Former Smoker    Quit date: 06/06/2012  . Smokeless tobacco: Never Used  . Alcohol Use: No  . Drug Use: No  . Sexual Activity: No   Other Topics Concern  . None   Social History Narrative   Additional Social History:                          Allergies:   Allergies  Allergen Reactions  . Alcohol Swelling    overall swelling  . Bextra [Valdecoxib]   . Celebrex [Celecoxib]   . Penicillins     Angioedema & rash  . Sulfa Antibiotics   . Sulfacetamide Sodium   . Vioxx [Rofecoxib]   . Metformin Rash    Sores, ulcers    Labs:  Results for orders placed or performed during the hospital encounter of 12/13/14 (from the past 48 hour(s))  CBC     Status: Abnormal   Collection Time: 12/20/14  4:12 AM  Result Value Ref Range   WBC 6.6 3.6 - 11.0 K/uL   RBC 2.94 (L) 3.80 - 5.20 MIL/uL   Hemoglobin 8.0 (L) 12.0 - 16.0 g/dL   HCT 16.124.8 (L) 09.635.0 - 04.547.0 %   MCV 84.3 80.0 - 100.0 fL   MCH 27.1 26.0 - 34.0 pg   MCHC 32.1 32.0 - 36.0 g/dL   RDW 40.915.8 (H) 81.111.5 - 91.414.5 %   Platelets 128 (L) 150 - 440 K/uL    Current Facility-Administered Medications  Medication Dose Route Frequency Provider Last Rate Last Dose  . acetaminophen (TYLENOL) tablet 650 mg  650 mg Oral Q6H PRN Auburn BilberryShreyang Patel, MD       Or  . acetaminophen (TYLENOL) suppository 650 mg  650 mg Rectal Q6H PRN Auburn BilberryShreyang Patel, MD      . dicyclomine (BENTYL) tablet 20 mg  20 mg Oral Daily Katha HammingSnehalatha Konidena, MD   20 mg at 12/20/14 0936  . escitalopram (LEXAPRO) tablet 10 mg  10 mg Oral Daily Audery AmelJohn T Yonael Tulloch, MD      . haloperidol (HALDOL) 2 MG/ML solution 2 mg  2 mg Oral Q6H PRN Shane CrutchPradeep Ramachandran, MD      . heparin injection 5,000 Units  5,000 Units Subcutaneous 3 times per day Auburn BilberryShreyang Patel, MD   5,000 Units at 12/20/14 0546  . insulin aspart (novoLOG) injection 5 Units  5 Units Intravenous Once Auburn BilberryShreyang Patel, MD      . LORazepam (ATIVAN) injection 1 mg   1 mg Intravenous Once Katha HammingSnehalatha Konidena, MD      .  midazolam (VERSED) injection 2 mg  2 mg Intravenous Once Vishal Mungal, MD   2 mg at 12/13/14 1745  . morphine 2 MG/ML injection 2 mg  2 mg Intravenous Q4H PRN Oralia Manis, MD   2 mg at 12/20/14 [phone]  . nitrofurantoin (macrocrystal-monohydrate) (MACROBID) capsule 100 mg  100 mg Oral Q12H Katha Hamming, MD   100 mg at 12/20/14 0936  . nitrofurantoin (MACRODANTIN) capsule 100 mg  100 mg Oral Q12H Katha Hamming, MD      . ondansetron (ZOFRAN) injection 4 mg  4 mg Intravenous Q4H PRN Katharina Caper, MD   4 mg at 12/19/14 2249  . pantoprazole (PROTONIX) EC tablet 40 mg  40 mg Oral Daily Katha Hamming, MD   40 mg at 12/20/14 0936  . QUEtiapine (SEROQUEL) tablet 600 mg  600 mg Oral QHS Katha Hamming, MD   600 mg at 12/19/14 2223  . risperiDONE (RISPERDAL M-TABS) disintegrating tablet 1 mg  1 mg Oral QHS Shane Crutch, MD   1 mg at 12/19/14 2223  . sodium chloride 0.9 % injection 3 mL  3 mL Intravenous Q12H Auburn Bilberry, MD   3 mL at 12/20/14 9604  . ziprasidone (GEODON) injection 10 mg  10 mg Intramuscular Q8H PRN Shane Crutch, MD        Musculoskeletal: Strength & Muscle Tone: decreased Gait & Station: unable to stand Patient leans: N/A  Psychiatric Specialty Exam: Review of Systems  HENT: Negative.   Eyes: Negative.   Respiratory: Negative.   Cardiovascular: Negative.   Gastrointestinal: Negative.   Musculoskeletal: Positive for joint pain and falls.  Skin: Negative.   Neurological: Positive for weakness.  Psychiatric/Behavioral: Negative for depression, suicidal ideas, hallucinations, memory loss and substance abuse. The patient is not nervous/anxious and does not have insomnia.     Blood pressure 137/62, pulse 79, temperature 97.5 F (36.4 C), temperature source Oral, resp. rate 2, height  (1.676 m), weight 103.1 kg (227 lb 4.7 oz), SpO2 98 %.Body mass index is 36.7 kg/(m^2).  General  Appearance: Casual  Eye Contact::  Good  Speech:  Normal Rate  Volume:  Normal  Mood:  Euthymic  Affect:  Appropriate  Thought Process:  Goal Directed  Orientation:  Full (Time, Place, and Person)  Thought Content:  Negative  Suicidal Thoughts:  No  Homicidal Thoughts:  No  Memory:  Immediate;   Good Recent;   Fair Remote;   Fair  Judgement:  Intact  Insight:  Present  Psychomotor Activity:  Normal  Concentration:  Fair  Recall:  [employer] of Knowledge:Fair  Language: Fair  Akathisia:  No  Handed:  Right  AIMS (if indicated):     Assets:  Communication Skills Desire for Improvement Financial Resources/Insurance Housing Resilience Social Support  ADL's:  Impaired  Cognition: WNL  Sleep:      Treatment Plan Summary: Daily contact with patient to assess and evaluate symptoms and progress in treatment, Medication management and Plan 49 year old woman who has a history of schizophrenia and also a history of becoming delirious when she gets medically sick. Sounds like that happen this time but now that she is on the mend she is back to a positive mental state. To my evaluation today she is denying any active psychotic symptoms or mood symptoms. Her mental state seems as good as I have ever seen it. She is tolerating medication well. Supportive counseling and review of plan with her. I support continuing Seroquel 600 mg at night and Lexapro  10 mg per day. If she needs something else added for sleep at fine but I would minimize or avoid benzodiazepines. Patient understands that I would like to see her back in my clinic if possible within a month or so after she gets out of the hospital. No change to medicine today. I will continue to follow-up with her while she is in the hospital.  Disposition: Patient does not meet criteria for psychiatric inpatient admission.  Quamesha Mullet 12/20/2014 12:43 PM

## 2014-12-20 NOTE — Consult Note (Signed)
[facility] Infectious Disease     Reason for Consult:  VRE UTI    Referring Physician: Vianne Bulls Date of Admission:  12/13/2014   Active Problems:   Acute encephalopathy   Acidemia   Altered mental status   ARF (acute renal failure) (HCC)   Hyperkalemia   UTI (lower urinary tract infection)   Undifferentiated schizophrenia (HCC)   HPI: [name] Haas is a 49 y.o. female chronic pain syndrome, schizophrenia, restless leg syndrome, irritable bowel syndrome, who was admitted to [facility] on 12/13/2014 with AMS, following nv.  Found to be in arf and with hyperkalemia.  UA had TNTC WBC and ucx grew VRE> She was initially on meropenem. Has been afebrile, wbc nml. She has hx of prior pyelonephritis and was treated with IV abx and resolved that infection in 2015. Currently she is back to her baseline. Can recall the days before her admission - says was hardly eating for a few days but did not know she had a UTI. DId not have flank pain or fevers, or dysuria.  Past Medical History  Diagnosis Date  . Other chronic pain   . Restless legs syndrome (RLS)   . Disorder of bone and cartilage, unspecified   . Unspecified essential hypertension   . Esophageal reflux   . Personal history of malignant neoplasm of cervix uteri   . Irritable bowel syndrome   . Contact dermatitis and other eczema, due to unspecified cause   . Lumbago   . Migraine, unspecified, without mention of intractable migraine without mention of status migrainosus   . Unspecified schizophrenia, unspecified condition   . Personal history of colonic polyps   . Symptomatic menopausal or female climacteric states   . Other and unspecified hyperlipidemia   . Pain in joint, site unspecified   . Hypertonicity of bladder   . Pain in joint, forearm   . History of renal failure and respiratory acidosis (05/24/2014) 11/20/2014   Past Surgical History  Procedure Laterality Date  . Breast lumpectomy      left multiple benign  . Hand  surgery  1988    left tendon transfer  . Vaginal hysterectomy  2005  . Bilateral oophorectomy  2005  . Cervical cone biopsy  2005  . Shoulder arthroscopy  2007    right  . Retinal detachment surgery  2010    right, repair   Social History  Substance Use Topics  . Smoking status: Former Smoker    Quit date: 06/06/2012  . Smokeless tobacco: Never Used  . Alcohol Use: No   Family History  Problem Relation Age of Onset  . Skin cancer Father     carcinoma, basal cell  . Melanoma Father   . Anemia Mother   . Drug abuse Brother   . Breast cancer Paternal Grandmother   . Diabetes type II Paternal Grandfather   . Other Father     hay fever  . Other Mother     epilepsy    Allergies:  Allergies  Allergen Reactions  . Alcohol Swelling    overall swelling  . Bextra [Valdecoxib]   . Celebrex [Celecoxib]   . Penicillins     Angioedema & rash  . Sulfa Antibiotics   . Sulfacetamide Sodium   . Vioxx [Rofecoxib]   . Metformin Rash    Sores, ulcers    Current antibiotics: Antibiotics Given (last 72 hours)    Date/Time Action Medication Dose Rate   12/17/14 1804 Given   meropenem (MERREM) 500  mg in sodium chloride 0.9 % 50 mL IVPB 500 mg 100 mL/hr   12/17/14 2248 Given   meropenem (MERREM) 500 mg in sodium chloride 0.9 % 50 mL IVPB 500 mg 100 mL/hr   12/18/14 0617 Given   meropenem (MERREM) 500 mg in sodium chloride 0.9 % 50 mL IVPB 500 mg 100 mL/hr   12/18/14 1715 Given   meropenem (MERREM) 500 mg in sodium chloride 0.9 % 50 mL IVPB 500 mg 100 mL/hr   12/18/14 2232 Given   meropenem (MERREM) 500 mg in sodium chloride 0.9 % 50 mL IVPB 500 mg 100 mL/hr   12/19/14 0554 Given   meropenem (MERREM) 500 mg in sodium chloride 0.9 % 50 mL IVPB 500 mg 100 mL/hr   12/19/14 1452 Given   nitrofurantoin (macrocrystal-monohydrate) (MACROBID) capsule 100 mg 100 mg    12/19/14 2223 Given   nitrofurantoin (macrocrystal-monohydrate) (MACROBID) capsule 100 mg 100 mg    12/20/14 0936 Given    nitrofurantoin (macrocrystal-monohydrate) (MACROBID) capsule 100 mg 100 mg       MEDICATIONS: . dicyclomine  20 mg Oral Daily  . escitalopram  10 mg Oral Daily  . heparin  5,000 Units Subcutaneous 3 times per day  . insulin aspart  5 Units Intravenous Once  . LORazepam  1 mg Intravenous Once  . midazolam  2 mg Intravenous Once  . nitrofurantoin (macrocrystal-monohydrate)  100 mg Oral Q12H  . pantoprazole  40 mg Oral Daily  . QUEtiapine  600 mg Oral QHS  . risperiDONE  1 mg Oral QHS  . sodium chloride  3 mL Intravenous Q12H    Review of Systems - 11 systems reviewed and negative per HPI   OBJECTIVE: Temp:  [97.5 F (36.4 C)-98.5 F (36.9 C)] 97.7 F (36.5 C) (12/15 1359) Pulse Rate:  [77-85] 85 (12/15 1359) Resp:  [2-18] 18 (12/15 1359) BP: (137-147)/(62-67) 145/67 mmHg (12/15 1359) SpO2:  [98 %] 98 % (12/15 1359) Physical Exam  Constitutional:  oriented to person, place, and time. appears well-developed and well-nourished. No distress. obese HENT: [place]/AT, PERRLA, no scleral icterus Mouth/Throat: Oropharynx is clear and moist. No oropharyngeal exudate.  Cardiovascular: Normal rate, regular rhythm and normal heart sounds. Pulmonary/Chest: Effort normal and breath sounds normal. No respiratory distress.  has no wheezes.  Neck = supple, no nuchal rigidity Abdominal: Soft. Bowel sounds are normal.  exhibits no distension. There is no tenderness. No CVAT Lymphadenopathy: no cervical adenopathy. No axillary adenopathy Neurological: alert and oriented to person, place, and time.  Skin: Skin is warm and dry. No rash noted. No erythema.  Psychiatric: a normal mood and affect.  behavior is normal.   LABS: Results for orders placed or performed during the hospital encounter of 12/13/14 (from the past 48 hour(s))  CBC     Status: Abnormal   Collection Time: 12/20/14  4:12 AM  Result Value Ref Range   WBC 6.6 3.6 - 11.0 K/uL   RBC 2.94 (L) 3.80 - 5.20 MIL/uL   Hemoglobin 8.0 (L)  12.0 - 16.0 g/dL   HCT 24.8 (L) 35.0 - 47.0 %   MCV 84.3 80.0 - 100.0 fL   MCH 27.1 26.0 - 34.0 pg   MCHC 32.1 32.0 - 36.0 g/dL   RDW 15.8 (H) 11.5 - 14.5 %   Platelets 128 (L) 150 - 440 K/uL   No components found for: ESR, C REACTIVE PROTEIN MICRO: Recent Results (from the past 720 hour(s))  Urine culture     Status: None  Collection Time: 12/13/14  8:30 AM  Result Value Ref Range Status   Specimen Description URINE, CLEAN CATCH  Final   Special Requests NONE  Final   Culture   Final    >=100,000 COLONIES/mL ENTEROCOCCUS FAECALIS VANCOMYCIN RESISTANT ENTEROCOCCUS CRITICAL RESULT CALLED TO, READ BACK BY AND VERIFIED WITH: Villa Herb AT 5638 ON 12/19/14 CTJ    Report Status 12/19/2014 FINAL  Final   Organism ID, Bacteria ENTEROCOCCUS FAECALIS  Final      Susceptibility   Enterococcus faecalis - MIC*    AMPICILLIN <=2 SENSITIVE Sensitive     NITROFURANTOIN <=16 SENSITIVE Sensitive     LINEZOLID 2 SENSITIVE Sensitive     CIPROFLOXACIN Value in next row Resistant      RESISTANT>=8    LEVOFLOXACIN Value in next row Resistant      RESISTANT>=8    TETRACYCLINE Value in next row Resistant      RESISTANT>=16    VANCOMYCIN Value in next row Resistant      RESISTANT>=16    GENTAMICIN SYNERGY Value in next row Resistant      RESISTANT>=16    * >=100,000 COLONIES/mL ENTEROCOCCUS FAECALIS  MRSA PCR Screening     Status: Abnormal   Collection Time: 12/13/14  3:00 PM  Result Value Ref Range Status   MRSA by PCR POSITIVE (A) NEGATIVE Final    Comment:        The GeneXpert MRSA Assay (FDA approved for NASAL specimens only), is one component of a comprehensive MRSA colonization surveillance program. It is not intended to diagnose MRSA infection nor to guide or monitor treatment for MRSA infections. CRITICAL RESULT CALLED TO, READ BACK BY AND VERIFIED WITH:  DALE HOPKINS AT 2117 12/13/14 SDR   Culture, blood (routine x 2)     Status: None   Collection Time: 12/13/14  5:25 PM   Result Value Ref Range Status   Specimen Description BLOOD LEFT WRIST  Final   Special Requests   Final    BOTTLES DRAWN AEROBIC AND ANAEROBIC  1CC AERO, 2CC ANAERO   Culture NO GROWTH 5 DAYS  Final   Report Status 12/18/2014 FINAL  Final  Culture, blood (routine x 2)     Status: None   Collection Time: 12/13/14  6:39 PM  Result Value Ref Range Status   Specimen Description BLOOD RIGHT ARM  Final   Special Requests BOTTLES DRAWN AEROBIC AND ANAEROBIC [place]  Final   Culture NO GROWTH 5 DAYS  Final   Report Status 12/18/2014 FINAL  Final    IMAGING: Dg Chest 1 View  12/14/2014  CLINICAL DATA:  Acute onset of shortness of breath and hypotension. Initial encounter. EXAM: CHEST 1 VIEW COMPARISON:  Chest radiograph performed 12/13/2014 FINDINGS: The lungs are hypoexpanded. A small left pleural effusion is suspected. Mildly increased central lung markings could reflect minimal interstitial edema or possibly pneumonia. No pneumothorax is seen. The cardiomediastinal silhouette is borderline normal in size. No acute osseous abnormalities are seen. A left shoulder arthroplasty is incompletely imaged, but appears grossly unremarkable. IMPRESSION: Lungs hypoexpanded. Suspect small left pleural effusion. Mildly increased central lung markings may reflect minimal interstitial edema or possibly pneumonia. Electronically Signed   By: Garald Balding M.D.   On: 12/14/2014 01:58   Dg Chest 2 View  12/13/2014  CLINICAL DATA:  Altered mental status, pale, diaphoretic. EXAM: CHEST  2 VIEW COMPARISON:  Chest x-ray of October 09, 2013 FINDINGS: The lungs are hypoinflated. There is crowding of the pulmonary vascularity.  The cardiac silhouette is enlarged. The pulmonary vascularity is indistinct. There is no pleural effusion. 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 IMPRESSION: Bilateral pulmonary hypoinflation. Stable enlargement cardiac silhouette. No definite pulmonary edema or pneumonia allowing for vascular crowding.  Electronically Signed   By: Shalandra Leu  [place] M.D.   On: 12/13/2014 09:17   Ct Head Wo Contrast  12/13/2014  CLINICAL DATA:  Altered mental status. EXAM: CT HEAD WITHOUT CONTRAST TECHNIQUE: Contiguous axial images were obtained from the base of the skull through the vertex without intravenous contrast. COMPARISON:  10/08/2013 FINDINGS: Skull and Sinuses:Negative for fracture or destructive process. The visualized mastoids, middle ears, and imaged paranasal sinuses are clear. Orbits: Scleral band on the right.  No acute finding. Brain: No evidence of acute infarction, hemorrhage, hydrocephalus, or mass lesion/mass effect. IMPRESSION: Negative head CT. Electronically Signed   By: Monte Fantasia M.D.   On: 12/13/2014 08:33   Mr Lumbar Spine Wo Contrast  12/20/2014  CLINICAL DATA:  49 year old female with lumbar back pain and right foot drop. Symptoms for 2-3 months. Initial encounter. EXAM: MRI LUMBAR SPINE WITHOUT CONTRAST TECHNIQUE: Multiplanar, multisequence MR imaging of the lumbar spine was performed. No intravenous contrast was administered. COMPARISON:  CT Abdomen and Pelvis 10/11/2013. Lumbar MRI 03/31/2011. FINDINGS: Normal lumbar segmentation demonstrated on the 2015 CT. Stable vertebral height and alignment. Mild retrolisthesis at L5-S1. No marrow edema or evidence of acute osseous abnormality. Visualized lower thoracic spinal cord is normal with conus medularis at L1-L2. No acute findings of the visible abdominal viscera. Negative visualized posterior paraspinal soft tissues. T11-T12:  Minimal disc desiccation.  Otherwise negative. T12-L1:  Negative. L1-L2:  Mild chronic posterior disc bulge.  No stenosis. L2-L3: Negative disc. Stable borderline to mild facet hypertrophy and no stenosis. L3-L4: Increased left foraminal disc extrusion (series 5, image 21 and series 2, images 12 and 13). Moderate to severe left L3 neural foraminal stenosis has increased. Superimposed mild facet hypertrophy and underlying  circumferential disc bulge appears stable. No left lateral recess stenosis. No significant right foraminal stenosis. L4-L5: Mild circumferential disc bulge, facet hypertrophy common ligament flavum hypertrophy appears stable. There is left greater than right chronic foraminal involvement by disc. No spinal or lateral recess stenosis. Mild left L4 foraminal stenosis is stable. L5-S1: Stable disc bulge with mild broad-based posterior component. Stable mild to moderate facet hypertrophy. No spinal or lateral recess stenosis. Mild bilateral L5 foraminal stenosis appears stable since 2013. IMPRESSION: 1. Progressive left foraminal disc extrusion at L3-L4 now with moderate to severe left L3 foraminal stenosis. 2. No lumbar spinal stenosis and other lumbar levels appear stable since 2013, including mild multifactorial left L4-L5 and bilateral L5-S1 foraminal stenosis. Electronically Signed   By: Genevie Ann M.D.   On: 12/20/2014 15:41   US Renal  12/13/2014  CLINICAL DATA:  49 year old female with acute renal failure. Initial encounter. EXAM: RENAL / URINARY TRACT ULTRASOUND COMPLETE COMPARISON:  10/11/2013. FINDINGS: Right Kidney: Evaluation limited by patient's habitus and patient unable to cooperate. Atrophic lobular appearance of the right kidney without hydronephrosis. 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 If further delineation of the right kidney is clinically desired, CT imaging (can be performed without contrast) may be considered. Left Kidney: Length: 9.2 cm. Echogenicity within normal limits. No mass or hydronephrosis visualized. Bladder: Appears normal for degree of bladder distention. IMPRESSION: Exam limited.  Particularly limited evaluation of the right kidney. Right kidney appears atrophic and lobulated. Follow-up as noted above. No hydronephrosis detected. Electronically Signed  By: Genia Del M.D.   On: 12/13/2014 11:02    Assessment:   [name] Haas is a 49 y.o. female  chronic pain syndrome, schizophrenia, restless leg syndrome, irritable bowel syndrome, who was admitted to [facility] on 12/13/2014 with AMS, following nv.  Found to be in ARF and with hyperkalemia.  UA had TNTC WBC and ucx grew VRE.  She was initially on meropenem (the orgnaism was amp sensitive but she has pcn allergy with rash and swelling) . Has been afebrile, wbc nml. She has hx of prior pyelonephritis and was treated with IV abx and resolved that infection in 2015. Clinically has responded nicely. No evidence of pyleo on USS but was poor study.   ABX  Meropenem Day 6 -> stopped 12/13 Macrobid 12/14->  Recommendations Agree with macrobid, Would give 21 day total treatment.  If worsens or infection recurs would need picc for IV meropenem  Thank you very much for allowing me to participate in the care of this patient. Please call with questions.   Cheral Marker. Ola Spurr, MD

## 2014-12-20 NOTE — Progress Notes (Signed)
Right trialysis catheter was d/c due to MD order and was intact when removed, site was held for 30 min and is not bleeding at this time. Foley was also removed, pt is due to void by 2115 12/20/2014 due to MD order.

## 2014-12-20 NOTE — Progress Notes (Signed)
[facility] [place] [facility]                                                                                                                                                                                            Patient Demographics   [name] Haas, is a 49 y.o. female, DOB - [birth year], QIO:[id number]  Admit date - 12/13/2014   Admitting Physician [name] BilberryShreyang Patel, MD  Outpatient Primary MD for the patient is [name], [name] KillianANNA S, MD   LOS - 7  C/o of leg pain. No other complaints. She is tolerating the diet. At some nausea this morning but resolved now. Review of Systems:   CONSTITUTIONAL:not  confused anymore.  Review of systems:  Awake alert oriented Cardiovascular; no chest pain  Lungs; no cough, shortness of breath. GI: no  Nausea, no vomiting, no abdominal pain, no diarrhea. Neurological: No headache, no double vision, no weakness in the hands or legs. Psych ;patient right now is not agitated. Extremities: Right foot drop observed. She says she had it for the last 2-3 months. Vitals:   Filed Vitals:   12/18/14 2027 12/19/14 0348 12/19/14 2048 12/20/14 0508  BP: 145/66 141/61 147/64 137/62  Pulse: 79 87 77 79  Temp: 98.5 F (36.9 C)  98.5 F (36.9 C) 97.5 F (36.4 C)  TempSrc: Oral  Oral Oral  Resp: 18  18 2   Height:      Weight:      SpO2: 97%  98% 98%    Wt Readings from Last 3 Encounters:  12/13/14 103.1 kg (227 lb 4.7 oz)  11/19/14 96.163 kg (212 lb)  06/07/14 100.336 kg (221 lb 3.2 oz)     Intake/Output Summary (Last 24 hours) at 12/20/14 1142 Last data filed at 12/20/14 [id number]  Gross per 24 hour  Intake      0 ml  Output   2702 ml  Net  -2702 ml    Physical Exam:   GENERAL: Critically ill-appearing female HEAD, EYES, EARS, NOSE AND THROAT: Atraumatic, normocephalic. Extraocular muscles are intact. Pupils equal and reactive to light. Sclerae anicteric. No conjunctival injection. No oro-pharyngeal erythema.  NECK:  Supple. There is no jugular venous distention. No bruits, no lymphadenopathy, no thyromegaly.  HEART: Regular rate and rhythm,. No murmurs, no rubs, no clicks.  LUNGS: Clear to auscultation bilaterally. No rales or rhonchi. No wheezes.  ABDOMEN: Soft, flat, nontender, nondistended. Has good bowel sounds. No hepatosplenomegaly appreciated.  EXTREMITIES: No evidence of any cyanosis, clubbing, or peripheral edema.  +2 pedal and radial pulses bilaterally.  Neuro; cranial nerves II-12 grossly intact strength 5 over 5  in all 4 extremities . Noted to have a right foot drop. SKIN: Moist and warm with no rashes appreciated.  Psych: Not anxious, depressed LN: No inguinal LN enlargement    Antibiotics   Anti-infectives    Start     Dose/Rate Route Frequency Ordered Stop   12/16/14 1500  meropenem (MERREM) 500 mg in sodium chloride 0.9 % 50 mL IVPB  Status:  Discontinued     500 mg 100 mL/hr over 30 Minutes Intravenous 3 times per day 12/16/14 1007 12/19/14 1204   12/13/14 1445  meropenem (MERREM) 500 mg in sodium chloride 0.9 % 50 mL IVPB  Status:  Discontinued     500 mg 100 mL/hr over 30 Minutes Intravenous Every 12 hours 12/13/14 1438 12/16/14 1007   12/13/14 1400  meropenem (MERREM) 1 g in sodium chloride 0.9 % 100 mL IVPB  Status:  Discontinued     1 g 200 mL/hr over 30 Minutes Intravenous 3 times per day 12/13/14 1002 12/13/14 1437   12/13/14 0930  ciprofloxacin (CIPRO) IVPB 400 mg     400 mg 200 mL/hr over 60 Minutes Intravenous  Once 12/13/14 0923 12/13/14 1040      Medications   Scheduled Meds: . dicyclomine  20 mg Oral Daily  . escitalopram  10 mg Oral Daily  . heparin  5,000 Units Subcutaneous 3 times per day  . insulin aspart  5 Units Intravenous Once  . midazolam  2 mg Intravenous Once  . nitrofurantoin (macrocrystal-monohydrate)  100 mg Oral Q12H  . pantoprazole  40 mg Oral Daily  . QUEtiapine  600 mg Oral QHS  . risperiDONE  1 mg Oral QHS  . sodium chloride  3 mL  Intravenous Q12H   Continuous Infusions:   PRN Meds:.acetaminophen **OR** acetaminophen, haloperidol, morphine injection, ondansetron (ZOFRAN) IV, ziprasidone   Data Review:   Micro Results Recent Results (from the past 240 hour(Haas))  Urine culture     Status: None   Collection Time: 12/13/14  8:30 AM  Result Value Ref Range Status   Specimen Description URINE, CLEAN CATCH  Final   Special Requests NONE  Final   Culture   Final    >=100,000 COLONIES/mL ENTEROCOCCUS FAECALIS VANCOMYCIN RESISTANT ENTEROCOCCUS CRITICAL RESULT CALLED TO, READ BACK BY AND VERIFIED WITH: [name] Haas AT 1191 ON 12/19/14 CTJ    Report Status 12/19/2014 FINAL  Final   Organism ID, Bacteria ENTEROCOCCUS FAECALIS  Final      Susceptibility   Enterococcus faecalis - MIC*    AMPICILLIN <=2 SENSITIVE Sensitive     NITROFURANTOIN <=16 SENSITIVE Sensitive     LINEZOLID 2 SENSITIVE Sensitive     CIPROFLOXACIN Value in next row Resistant      RESISTANT>=8    LEVOFLOXACIN Value in next row Resistant      RESISTANT>=8    TETRACYCLINE Value in next row Resistant      RESISTANT>=16    VANCOMYCIN Value in next row Resistant      RESISTANT>=16    GENTAMICIN SYNERGY Value in next row Resistant      RESISTANT>=16    * >=100,000 COLONIES/mL ENTEROCOCCUS FAECALIS  MRSA PCR Screening     Status: Abnormal   Collection Time: 12/13/14  3:00 PM  Result Value Ref Range Status   MRSA by PCR POSITIVE (A) NEGATIVE Final    Comment:        The GeneXpert MRSA Assay (FDA approved for NASAL specimens only), is one component of a comprehensive MRSA colonization  surveillance program. It is not intended to diagnose MRSA infection nor to guide or monitor treatment for MRSA infections. CRITICAL RESULT CALLED TO, READ BACK BY AND VERIFIED WITH:  [name] Haas AT 2117 12/13/14 SDR   Culture, blood (routine x 2)     Status: None   Collection Time: 12/13/14  5:25 PM  Result Value Ref Range Status   Specimen Description BLOOD  LEFT WRIST  Final   Special Requests   Final    BOTTLES DRAWN AEROBIC AND ANAEROBIC  1CC AERO, 2CC ANAERO   Culture NO GROWTH 5 DAYS  Final   Report Status 12/18/2014 FINAL  Final  Culture, blood (routine x 2)     Status: None   Collection Time: 12/13/14  6:39 PM  Result Value Ref Range Status   Specimen Description BLOOD RIGHT ARM  Final   Special Requests BOTTLES DRAWN AEROBIC AND ANAEROBIC 7CC  Final   Culture NO GROWTH 5 DAYS  Final   Report Status 12/18/2014 FINAL  Final    Radiology Reports Dg Chest 1 View  12/14/2014  CLINICAL DATA:  Acute onset of shortness of breath and hypotension. Initial encounter. EXAM: CHEST 1 VIEW COMPARISON:  Chest radiograph performed 12/13/2014 FINDINGS: The lungs are hypoexpanded. A small left pleural effusion is suspected. Mildly increased central lung markings could reflect minimal interstitial edema or possibly pneumonia. No pneumothorax is seen. The cardiomediastinal silhouette is borderline normal in size. No acute osseous abnormalities are seen. A left shoulder arthroplasty is incompletely imaged, but appears grossly unremarkable. IMPRESSION: Lungs hypoexpanded. Suspect small left pleural effusion. Mildly increased central lung markings may reflect minimal interstitial edema or possibly pneumonia. Electronically Signed   By: Roanna Raider M.D.   On: 12/14/2014 01:58   Dg Chest 2 View  12/13/2014  CLINICAL DATA:  Altered mental status, pale, diaphoretic. EXAM: CHEST  2 VIEW COMPARISON:  Chest x-ray of October 09, 2013 FINDINGS: The lungs are hypoinflated. There is crowding of the pulmonary vascularity. The cardiac silhouette is enlarged. The pulmonary vascularity is indistinct. There is no pleural effusion. 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 IMPRESSION: Bilateral pulmonary hypoinflation. Stable enlargement cardiac silhouette. No definite pulmonary edema or pneumonia allowing for vascular crowding. Electronically Signed   By: David  [place] M.D.   On:  12/13/2014 09:17   Ct Head Wo Contrast  12/13/2014  CLINICAL DATA:  Altered mental status. EXAM: CT HEAD WITHOUT CONTRAST TECHNIQUE: Contiguous axial images were obtained from the base of the skull through the vertex without intravenous contrast. COMPARISON:  10/08/2013 FINDINGS: Skull and Sinuses:Negative for fracture or destructive process. The visualized mastoids, middle ears, and imaged paranasal sinuses are clear. Orbits: Scleral band on the right.  No acute finding. Brain: No evidence of acute infarction, hemorrhage, hydrocephalus, or mass lesion/mass effect. IMPRESSION: Negative head CT. Electronically Signed   By: Marnee Spring M.D.   On: 12/13/2014 08:33   US Renal  12/13/2014  CLINICAL DATA:  49 year old female with acute renal failure. Initial encounter. EXAM: RENAL / URINARY TRACT ULTRASOUND COMPLETE COMPARISON:  10/11/2013. FINDINGS: Right Kidney: Evaluation limited by patient'Haas habitus and patient unable to cooperate. Atrophic lobular appearance of the right kidney without hydronephrosis. 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 If further delineation of the right kidney is clinically desired, CT imaging (can be performed without contrast) may be considered. Left Kidney: Length: 9.2 cm. Echogenicity within normal limits. No mass or hydronephrosis visualized. Bladder: Appears normal for degree of bladder distention. IMPRESSION: Exam limited.  Particularly limited evaluation of the right kidney. Right kidney appears atrophic and lobulated. Follow-up as noted above. No hydronephrosis detected. Electronically Signed   By: Lacy Duverney M.D.   On: 12/13/2014 11:02     CBC  Recent Labs Lab 12/13/14 1839 12/14/14 0437 12/16/14 0455 12/17/14 0509 12/20/14 0412  WBC 4.3 9.3 3.7 6.7 6.6  HGB 7.6* 9.1* 7.8* 9.4* 8.0*  HCT 23.2* 27.4* 23.5* 30.3* 24.8*  PLT 191 239 136* 152 128*  MCV 86.6 86.1 86.2 87.4 84.3  MCH 28.3 28.6 28.6 27.3 27.1  MCHC 32.6 33.3 33.2 31.2*  32.1  RDW 17.1* 17.3* 15.9* 16.2* 15.8*    Chemistries   Recent Labs Lab 12/13/14 1511 12/13/14 1839  12/14/14 2204 12/15/14 0455 12/15/14 1311 12/16/14 0455 12/17/14 0509  NA 137 139  < > 136 138 139 142 142  K >7.5* 7.4*  < > 5.1 5.1 4.5 4.4 4.6  CL 107 106  < > 104 104 107 109 109  CO2 16* 19*  < > 19* GLUCOSE 78 97  < > 159* 137* 115* 96 92  BUN 67* 68*  < > 39* 36* 32* 27* 21*  CREATININE 7.46*  7.70* 7.54*  < > 2.85* 2.16* 1.79* 1.37* 1.18*  CALCIUM 8.0* 7.8*  < > 8.3* 8.4* 8.4* 8.4* 8.6*  MG 2.3 2.1  --   --   --   --  1.9 2.2  AST 29  --   --   --   --  24  --   --   ALT 15  --   --   --   --  15  --   --   ALKPHOS 92  --   --   --   --  71  --   --   BILITOT 0.9  --   --   --   --  0.8  --   --   < > = values in this interval not displayed. ------------------------------------------------------------------------------------------------------------------ estimated creatinine clearance is 68.4 mL/min (by C-G formula based on Cr of 1.18). ------------------------------------------------------------------------------------------------------------------ No results for input(Haas): HGBA1C in the last 72 hours. ------------------------------------------------------------------------------------------------------------------ No results for input(Haas): CHOL, HDL, LDLCALC, TRIG, CHOLHDL, LDLDIRECT in the last 72 hours. ------------------------------------------------------------------------------------------------------------------ No results for input(Haas): TSH, T4TOTAL, T3FREE, THYROIDAB in the last 72 hours.  Invalid input(Haas): FREET3 ------------------------------------------------------------------------------------------------------------------ No results for input(Haas): VITAMINB12, FOLATE, FERRITIN, TIBC, IRON, RETICCTPCT in the last 72 hours.  Coagulation profile No results for input(Haas): INR, PROTIME in the last 168 hours.  No results for input(Haas): DDIMER in  the last 72 hours.  Cardiac Enzymes No results for input(Haas): CKMB, TROPONINI, MYOGLOBIN in the last 168 hours.  Invalid input(Haas): CK ------------------------------------------------------------------------------------------------------------------ Invalid input(Haas): POCBNP    Assessment & Plan   IMPRESSION AND PLAN: Patient is a 49 year old white female brought in by family due to confusion 1. Acute encephalopathy: The combination of acute renal failure and ATN.   Encephalopathy resolved.  Nausea,vomting diarrhea: Possible viral gastroenteritis:  resolved. Start on regular diet. Tolerating the diet.  2. Hypotension could be related to urinary tract infection and sepsis. Sepsis secondary to VRE in the urine. Will request ID consult to start the patient on Zyvox. Continue isolation.  3. Acute renal failure with severe hyperkalemia;, metabolic acidosis with metabolic encephalopathy:patient received bicarbonate drip. It is post emergency hemodialysis. Renal function improved with good urine output.possible rhabdomyolysis on admission  Along with NSAId use;was taking diclofenac gel,but CPK was not  checked. Renal Failure to improved. Discontinue dialysis catheter, Discontinue Foley,   Potassium is better, renal function is better   6.  Bipolar disorder ; restarted her medications, psychiatric following the patient.   7.  IBS: Continue Bentyl. #8 history of gastritis and duodenitis before she is on PPIs. #9 right foot drop: Supposed to have MRI of the lumbosacral spine we can order that here.  #10 out of bed to chair, no physical therapy restrictions.  Disposition depends on physical therapy evaluation.     Code Status Orders        Start     Ordered   12/13/14 1510  Full code   Continuous     12/13/14 1509           Consults  nephrologlo  DVT Prophylaxis heparin  Lab Results  Component Value Date   PLT 128* 12/20/2014     Time Spent in minutes ; 35  minutes .Katha Hamming M.D on 12/20/2014 at 11:42 AM  Between 7am to 6pm - Pager - [phone]  After 6pm go to [email or web address] - password [email or web address] [facility]  [facility]   Office  [phone]

## 2014-12-21 DIAGNOSIS — F203 Undifferentiated schizophrenia: Secondary | ICD-10-CM

## 2014-12-21 MED ORDER — MIRABEGRON ER 50 MG PO TB24
50.0000 mg | ORAL_TABLET | Freq: Every day | ORAL | Status: DC
  Administered 2014-12-21 – 2014-12-23 (×3): 50 mg via ORAL
  Filled 2014-12-21 (×3): qty 1

## 2014-12-21 NOTE — Progress Notes (Signed)
[facility] INFECT[facility]OUS DISEASE PROGRESS NOTE Date of Admission:  12/13/2014     ID: [name] Haas is a 49 y.o. female with UTI  Active Problems:   Acute encephalopathy   Acidemia   Altered mental status   ARF (acute renal failure) (HCC)   Hyperkalemia   UTI (lower urinary tract infection)   Undifferentiated schizophrenia (HCC)   Subjective: Is working with PT, oob to chair  ROS  Eleven systems are reviewed and negative except per hpi  Medications:  Antibiotics Given (last 72 hours)    Date/Time Action Medication Dose Rate   12/18/14 1715 Given   meropenem (MERREM) 500 mg in sodium chloride 0.9 % 50 mL IVPB 500 mg 100 mL/hr   12/18/14 2232 Given   meropenem (MERREM) 500 mg in sodium chloride 0.9 % 50 mL IVPB 500 mg 100 mL/hr   12/19/14 0554 Given   meropenem (MERREM) 500 mg in sodium chloride 0.9 % 50 mL IVPB 500 mg 100 mL/hr   12/19/14 1452 Given   nitrofurantoin (macrocrystal-monohydrate) (MACROBID) capsule 100 mg 100 mg    12/19/14 2223 Given   nitrofurantoin (macrocrystal-monohydrate) (MACROBID) capsule 100 mg 100 mg    12/20/14 0936 Given   nitrofurantoin (macrocrystal-monohydrate) (MACROBID) capsule 100 mg 100 mg    12/20/14 2213 Given   nitrofurantoin (macrocrystal-monohydrate) (MACROBID) capsule 100 mg 100 mg    12/21/14 1053 Given   nitrofurantoin (macrocrystal-monohydrate) (MACROBID) capsule 100 mg 100 mg      . dicyclomine  20 mg Oral Daily  . escitalopram  10 mg Oral Daily  . heparin  5,000 Units Subcutaneous 3 times per day  . insulin aspart  5 Units Intravenous Once  . LORazepam  1 mg Intravenous Once  . midazolam  2 mg Intravenous Once  . mirabegron ER  50 mg Oral Daily  . nitrofurantoin (macrocrystal-monohydrate)  100 mg Oral Q12H  . pantoprazole  40 mg Oral Daily  . QUEtiapine  600 mg Oral QHS  . risperiDONE  1 mg Oral QHS  . sodium chloride  3 mL Intravenous Q12H    Objective: Vital signs in last 24 hours: Temp:  [97.8 F (36.6 C)-98.3  F (36.8 C)] 97.8 F (36.6 C) (12/16 1314) Pulse Rate:  [70-93] 93 (12/16 1314) Resp:  [16-18] 17 (12/16 1314) BP: (115-144)/(56-69) 122/57 mmHg (12/16 1314) SpO2:  [98 %] 98 % (12/16 1314) Constitutional: oriented to person, place, and time. appears well-developed and well-nourished. No distress. obese HENT: [place]/AT, PERRLA, no scleral icterus Mouth/Throat: Oropharynx is clear and moist. No oropharyngeal exudate.  Cardiovascular: Normal rate, regular rhythm and normal heart sounds. Pulmonary/Chest: Effort normal and breath sounds normal. No respiratory distress. has no wheezes.  Neck = supple, no nuchal rigidity Abdominal: Soft. Bowel sounds are normal. exhibits no distension. There is no tenderness. No CVAT Lymphadenopathy: no cervical adenopathy. No axillary adenopathy Neurological: alert and oriented to person, place, and time.  Skin: Skin is warm and dry. No rash noted. No erythema.  Psychiatric: a normal mood and affect. behavior is normal.   Lab Results  Recent Labs  12/20/14 0412  WBC 6.6  HGB 8.0*  HCT 24.8*    Microbiology: Results for orders placed or performed during the hospital encounter of 12/13/14  Urine culture     Status: None   Collection Time: 12/13/14  8:30 AM  Result Value Ref Range Status   Specimen Description URINE, CLEAN CATCH  Final   Special Requests NONE  Final   Culture  Final    >=100,000 COLONIES/mL ENTEROCOCCUS FAECALIS VANCOMYCIN RESISTANT ENTEROCOCCUS CRITICAL RESULT CALLED TO, READ BACK BY AND VERIFIED WITH: Adelina Mings AT 1308 ON 12/19/14 CTJ    Report Status 12/19/2014 FINAL  Final   Organism ID, Bacteria ENTEROCOCCUS FAECALIS  Final      Susceptibility   Enterococcus faecalis - MIC*    AMPICILLIN <=2 SENSITIVE Sensitive     NITROFURANTOIN <=16 SENSITIVE Sensitive     LINEZOLID 2 SENSITIVE Sensitive     CIPROFLOXACIN Value in next row Resistant      RESISTANT>=8    LEVOFLOXACIN Value in next row Resistant      RESISTANT>=8     TETRACYCLINE Value in next row Resistant      RESISTANT>=16    VANCOMYCIN Value in next row Resistant      RESISTANT>=16    GENTAMICIN SYNERGY Value in next row Resistant      RESISTANT>=16    * >=100,000 COLONIES/mL ENTEROCOCCUS FAECALIS  MRSA PCR Screening     Status: Abnormal   Collection Time: 12/13/14  3:00 PM  Result Value Ref Range Status   MRSA by PCR POSITIVE (A) NEGATIVE Final    Comment:        The GeneXpert MRSA Assay (FDA approved for NASAL specimens only), is one component of a comprehensive MRSA colonization surveillance program. It is not intended to diagnose MRSA infection nor to guide or monitor treatment for MRSA infections. CRITICAL RESULT CALLED TO, READ BACK BY AND VERIFIED WITH:  DALE HOPKINS AT 2117 12/13/14 SDR   Culture, blood (routine x 2)     Status: None   Collection Time: 12/13/14  5:25 PM  Result Value Ref Range Status   Specimen Description BLOOD LEFT WRIST  Final   Special Requests   Final    BOTTLES DRAWN AEROBIC AND ANAEROBIC  1CC AERO, 2CC ANAERO   Culture NO GROWTH 5 DAYS  Final   Report Status 12/18/2014 FINAL  Final  Culture, blood (routine x 2)     Status: None   Collection Time: 12/13/14  6:39 PM  Result Value Ref Range Status   Specimen Description BLOOD RIGHT ARM  Final   Special Requests BOTTLES DRAWN AEROBIC AND ANAEROBIC 7CC  Final   Culture NO GROWTH 5 DAYS  Final   Report Status 12/18/2014 FINAL  Final    Studies/Results: Mr Lumbar Spine Wo Contrast  12/20/2014  CLINICAL DATA:  49 year old female with lumbar back pain and right foot drop. Symptoms for 2-3 months. Initial encounter. EXAM: MRI LUMBAR SPINE WITHOUT CONTRAST TECHNIQUE: Multiplanar, multisequence MR imaging of the lumbar spine was performed. No intravenous contrast was administered. COMPARISON:  CT Abdomen and Pelvis 10/11/2013. Lumbar MRI 03/31/2011. FINDINGS: Normal lumbar segmentation demonstrated on the 2015 CT. Stable vertebral height and alignment. Mild  retrolisthesis at L5-S1. No marrow edema or evidence of acute osseous abnormality. Visualized lower thoracic spinal cord is normal with conus medularis at L1-L2. No acute findings of the visible abdominal viscera. Negative visualized posterior paraspinal soft tissues. T11-T12:  Minimal disc desiccation.  Otherwise negative. T12-L1:  Negative. L1-L2:  Mild chronic posterior disc bulge.  No stenosis. L2-L3: Negative disc. Stable borderline to mild facet hypertrophy and no stenosis. L3-L4: Increased left foraminal disc extrusion (series 5, image 21 and series 2, images 12 and 13). Moderate to severe left L3 neural foraminal stenosis has increased. Superimposed mild facet hypertrophy and underlying circumferential disc bulge appears stable. No left lateral recess stenosis. No significant right foraminal stenosis.  L4-L5: Mild circumferential disc bulge, facet hypertrophy common ligament flavum hypertrophy appears stable. There is left greater than right chronic foraminal involvement by disc. No spinal or lateral recess stenosis. Mild left L4 foraminal stenosis is stable. L5-S1: Stable disc bulge with mild broad-based posterior component. Stable mild to moderate facet hypertrophy. No spinal or lateral recess stenosis. Mild bilateral L5 foraminal stenosis appears stable since 2013. IMPRESSION: 1. Progressive left foraminal disc extrusion at L3-L4 now with moderate to severe left L3 foraminal stenosis. 2. No lumbar spinal stenosis and other lumbar levels appear stable since 2013, including mild multifactorial left L4-L5 and bilateral L5-S1 foraminal stenosis. Electronically Signed   By: Odessa FlemingH  Hall M.D.   On: 12/20/2014 15:41    Assessment/Plan: Lita MainsKendra A Haas is a 49 y.o. female chronic pain syndrome, schizophrenia, restless leg syndrome, irritable bowel syndrome, who [facility] with AMS, following nv. Found to be in ARF and with hyperkalemia. UA had TNTC WBC and ucx grew VRE. She was initially  on meropenem (the orgnaism was amp sensitive but she has pcn allergy with rash and swelling) . Has been afebrile, wbc nml. She has hx of prior pyelonephritis and was treated with IV abx and resolved that infection in 2015. Clinically has responded nicely. No evidence of pyleo on USS but was poor study.   ABX  Meropenem Day 6 -> stopped 12/13 Macrobid 12/14->  Recommendations Cont  Macrobid,- give 21 day total treatment.  If worsens or infection recurs would need picc for IV meropenem Thank you very much for the consult. Will follow with you.  Runette Scifres   12/21/2014, 3:07 PM

## 2014-12-21 NOTE — Progress Notes (Signed)
Speech Therapy Note: reviewed chart notes; met w/ pt; consulted NSG. Pt appeared much stronger and stated she felt "better" reporting she had only "a little" n/v yesterday pm. Pt stated she is drinking and eating w/out any oropharyngeal phase dysphagia; no coughing or throat clearing noted. Lungs are CTA; no elevated temp; pt is on RA.  Pt appears to be tolerating a regular diet w/ thin liquids and appears at her baseline re: swallowing. No further skilled ST services indicated at this time. NSG to reconsult if any decline in status. Rec'd pt continue w/ general aspiration and reflux precautions. Pt agreed.

## 2014-12-21 NOTE — Care Management Important Message (Signed)
Important Message  Patient Details  Name: [name] Haas MRN: [id number] Date of Birth: Oct 02, 1965   Medicare Important Message Given:  Yes    Adonis HugueninBerkhead, Hazael Olveda L, RN 12/21/2014, 9:59 AM

## 2014-12-21 NOTE — Care Management (Signed)
Spoke with patient for discharge planning. Patient lives at home with her father whom she states is able to take care of her. [facility] admission. She stated that she would like to continue with this agency at discharge.Patient has walker cane and wheelchair i the home as well as lift chair.  Will need resumption of Home Health orders upon discharge. Anticipate discharge with HH.

## 2014-12-21 NOTE — Evaluation (Signed)
Physical Therapy Evaluation Patient Details Name: [name] Haas MRN: [id number] DOB: 1965/03/03 Today's Date: 12/21/2014   History of Present Illness  Pt is a 49 y.o. female presenting to hospital with AMS and admitted with acute encephalopathy, UTI, and acute renal failure with critically elevated potassium.  Pt s/p emergent dialysis (temporary fem cath removed 12/20/14) and hospital stay complicated d/t delirium and agitation.  12/20/14 MRI of L-spine (d/t R foot drop) shows progressive L foraminal disc extrusion L3-4 now with moderate to severe L L3 foraminal stenosis.  PMH includes bipolar disorder, RLS, IBS, h/o PE, L TSR, R shoulder surgery, paranoid schizophrenia, R foot drop x 2-3 months, and L tib/fib ORIF June 2016--pt reports being WBAT with boot on.  Per notes, pt with distal 5th metatarsal shaft fx but pt denies this fx.  Clinical Impression  Prior to admission, pt was modified independent household distances with RW and using L LE boot.  Pt lives with her father on main floor of home.  Pt has been receiving HHPT (pt also has R foot drop for past 2-3 months per pt).  Pt did not have L LE boot in hospital so pt called her dad to bring it in and PT returned in PM to assess OOB mobility with L LE boot on.  Currently pt is CGA with transfers and ambulation with RW and use of L LE boot; pt steady without loss of balance.  Pt would benefit from skilled PT to address noted impairments and functional limitations.  Recommend pt discharge to home with support of family and HHPT when medically appropriate.     Follow Up Recommendations Home health PT    Equipment Recommendations       Recommendations for Other Services       Precautions / Restrictions Precautions Precautions: Fall Restrictions Weight Bearing Restrictions: Yes LUE Weight Bearing:  (pt reports putting minimal weight through L UE since surgery last February but does use the RW at home) LLE Weight Bearing: Weight bearing as  tolerated Other Position/Activity Restrictions: L LE WBAT with boot on per pt      Mobility  Bed Mobility Overal bed mobility: Needs Assistance Bed Mobility: Supine to Sit;Sit to Supine     Supine to sit: Supervision;HOB elevated Sit to supine: Supervision;HOB elevated      Transfers Overall transfer level: Needs assistance Equipment used: Rolling walker (2 wheeled) Transfers: Sit to/from Stand Sit to Stand: Min guard;Min assist         General transfer comment: min assist first trial; CGA 2nd trial standing  Ambulation/Gait Ambulation/Gait assistance: Min guard Ambulation Distance (Feet): 30 Feet Assistive device: Rolling walker (2 wheeled)   Gait velocity: decreased   General Gait Details: mildly antalgic d/t pt c/o R foot pain (pt reports her dad brought shoe too small for her causing the pain); limited distance d/t R foot pain  Stairs            Wheelchair Mobility    Modified Rankin (Stroke Patients Only)       Balance Overall balance assessment: Needs assistance Sitting-balance support: No upper extremity supported;Feet supported Sitting balance-Leahy Scale: Normal     Standing balance support: Bilateral upper extremity supported (on RW) Standing balance-Leahy Scale: Good                               Pertinent Vitals/Pain Pain Assessment:  (pt c/o R foot pain during ambulation only d/t  shoe her dad brought was too small)  Vitals stable and WFL throughout treatment session.    Home Living Family/patient expects to be discharged to:: Private residence Living Arrangements: Parent (Pt's father) Available Help at Discharge: Family Type of Home: House Home Access: Stairs to enter Entrance Stairs-Rails: Left Entrance Stairs-Number of Steps: 3 Home Layout: One level Home Equipment: Walker - 2 wheels;Wheelchair - manual (transport chair; chair lift on stairs basement to first floor)      Prior Function Level of Independence: Needs  assistance   Gait / Transfers Assistance Needed: pt using RW in home but uses chair lift to get 1st floor to basement and uses manual w/c from there to get to car (also uses manual w/c in community); uses transport chair on 2nd floor     Comments: has been receiving HHPT since L tib/fib fx     Hand Dominance        Extremity/Trunk Assessment   Upper Extremity Assessment: Generalized weakness           Lower Extremity Assessment: Generalized weakness;RLE deficits/detail RLE Deficits / Details: R foot drop; R DF 15 degrees short of neutral       Communication   Communication: No difficulties  Cognition Arousal/Alertness: Awake/alert Behavior During Therapy: WFL for tasks assessed/performed Overall Cognitive Status: Within Functional Limits for tasks assessed                      General Comments   Nursing cleared pt for participation in physical therapy.  Pt agreeable to PT session.    Exercises   Performed semi-supine B LE therapeutic exercise x 10 reps:  Ankle pumps (AROM L LE); quad sets x3 second holds (AROM B LE's); glute squeezes x3 second holds (AROM B); SAQ's (AROM R; AROM L); heelslides (AROM R; AROM L), hip abd/adduction (AROM R; AROM L).  Pt required vc's and tactile cues for correct technique with exercises.       Assessment/Plan    PT Assessment Patient needs continued PT services  PT Diagnosis Generalized weakness;Difficulty walking   PT Problem List Decreased strength;Decreased range of motion;Decreased activity tolerance;Decreased balance;Decreased mobility  PT Treatment Interventions DME instruction;Gait training;Stair training;Functional mobility training;Therapeutic activities;Therapeutic exercise;Balance training;Patient/family education;Wheelchair mobility training   PT Goals (Current goals can be found in the Care Plan section) Acute Rehab PT Goals Patient Stated Goal: to go home PT Goal Formulation: With patient Time For Goal  Achievement: 01/04/15 Potential to Achieve Goals: Good    Frequency Min 2X/week   Barriers to discharge        Co-evaluation               End of Session Equipment Utilized During Treatment: Gait belt Activity Tolerance: Patient tolerated treatment well (except for R foot pain d/t shoe) Patient left: in chair;with call bell/phone within reach;with chair alarm set Nurse Communication: Mobility status;Precautions;Weight bearing status         Time: 1025 (PM session (pt's dad brought boot for OOB mobility) 1412)-1055 (PM session 1435)  1025-1055 AM; [id number] PM PT Time Calculation (min) (ACUTE ONLY): 30 min plus 23 PM minutes   Charges:   PT Evaluation $Initial PT Evaluation Tier I: 1 Procedure PT Treatments $Gait Training: 8-22 mins $Therapeutic Exercise: 8-22 mins $Therapeutic Activity: 8-22 mins   PT G CodesHendricks Limes:        Denzal Meir 12/21/2014, 6:14 PM Hendricks LimesEmily Dekisha Mesmer, PT [phone]

## 2014-12-21 NOTE — Consult Note (Signed)
[facility] Face-to-Face Psychiatry Consult   Reason for Consult:  Consult for 49 year old woman with a past history of schizophrenia who is currently in the hospital because of acute renal failure probably from infection. Consult for follow-up psychiatric treatment Referring Physician:  Luberta Mutter Patient Identification: BRIELL PAULETTE MRN:  [id number] Principal Diagnosis: Schizophrenia Diagnosis:   Patient Active Problem List   Diagnosis Date Noted  . Undifferentiated schizophrenia (HCC) [F20.3]   . Acute encephalopathy [G93.40] 12/13/2014  . Acidemia [E87.2]   . Altered mental status [R41.82]   . ARF (acute renal failure) (HCC) [N17.9]   . Hyperkalemia [E87.5]   . UTI (lower urinary tract infection) [N39.0]   . Chronic left shoulder pain [M25.512, G89.29] 11/20/2014  . 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 Chronic low back pain [M54.5, G89.29] 11/20/2014  . History of renal failure and respiratory acidosis (05/24/2014) [Z87.448] 11/20/2014  . Chronic pain syndrome [G89.4] 11/20/2014  . Long term current use of opiate analgesic [Z79.891] 11/19/2014  . Long term prescription opiate use [Z79.899] 11/19/2014  . Opiate use [F11.90] 11/19/2014  . Opiate dependence (HCC) [F11.20] 11/19/2014  . Encounter for therapeutic drug level monitoring [Z51.81] 11/19/2014  . Lumbar radiculopathy, acute (Right L5 Radiculopathy. "Drop foot") [M54.16] 11/19/2014  . Myofascial pain [M79.1] 11/19/2014  . Neurogenic pain [M79.2] 11/19/2014  . Foot drop, right [M21.371] 11/19/2014  . Chronic pain of right lower extremity [M79.604, G89.29] 11/19/2014  . Encounter for surgical follow-up care [Z48.89] 11/08/2014  . Absolute anemia [D64.9] 07/06/2014  . Injury of kidney [S37.009A] 07/05/2014  . Fracture of shaft of tibia and fibula, open [S82.90XB] 07/05/2014  . Paranoid schizophrenia (HCC) [F20.0] 06/14/2014  . Back ache [M54.9] 06/14/2014  . Arm fracture [S42.309A] 06/14/2014  . Depression, major,  recurrent, moderate (HCC) [F33.1] 06/14/2014  . Aggrieved [F43.21] 06/14/2014  . 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 Essential hypertension [I10] 06/07/2014  . Pain in limb [M79.609] 06/07/2014  . HLD (hyperlipidemia) [E78.5] 06/07/2014  . Fracture of bone adjacent to prosthesis (HCC) [W09.9XXA] 02/13/2014  . Extreme obesity (HCC) [E66.01] 01/23/2014  . Morbid obesity (HCC) [E66.01] 01/23/2014  . Type 2 diabetes mellitus with hyperglycemia (HCC) [E11.65] 01/23/2014  . Chronic pain [G89.29] 11/20/2013  . BP (high blood pressure) [I10] 11/20/2013  . Headache, migraine [G43.909] 11/20/2013  . 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 Affective bipolar disorder (HCC) [F31.9] 03/15/2012  . Acid reflux [K21.9] 03/15/2012  . Adiposity [E66.9] 03/15/2012  . Compulsive tobacco user syndrome [F17.200] 03/15/2012  . Bipolar affective disorder (HCC) [F31.9] 03/15/2012  . Incomplete bladder emptying [R33.9] 09/22/2011  . Urge incontinence of urine [N39.41] 08/25/2011    Total Time spent with patient: 1 hour  Subjective:   [name] Haas is a 49 y.o. female patient admitted with "I'm feeling much better".  HPI:  Information from the patient and the chart. Patient interviewed. Chart reviewed. I'm also familiar with the patient from outpatient treatment. This 49 year old woman has a history of schizophrenia and has been seen by me for years in my outpatient office although I hadn't seen her in months. Probably a big part of that was because she was in rehabilitation. Patient currently is in the hospital because of acute confusion that appears to been related to renal failure from an infection. She is now feeling much better. She tells me today that her mood is feeling good. She denies feeling depressed. Has no suicidal or homicidal thoughts at all. She tells  me she is actually not having any of her hallucinations. She denies any  auditory or visual or even tactile hallucinations which used to be a major problem for her. She is not feeling paranoid. Patient is able to express and explain a good deal about her medical problems. Had a serious fracture to her left leg several months ago requiring quite a bit of rehabilitation. Has developed a dropped foot now on the right side. Then came into the hospital for infection but is cleared up. She says that she had been taking her Seroquel 600 mg at night and Lexapro as an outpatient. She has been continuing to take it since being in the hospital. She has no new complaints for me.  Social history: Patient lives with her father. Father is reportedly actually quite functional and often serves to take care of her. They have a good relationship.  Medical history: Patient has noted above had a terrible fracture to her left leg earlier in the year and is still recovering and now has a dropped foot on the right side for some reason. Also has a history of high blood pressure  Substance abuse history: Patient has had times in the past where she was on chronic narcotics third been some concern about whether this was fully appropriate but I have never seen the patient as having a major substance abuse problem and she denies that she's been using any substances inappropriately recently.  Past Psychiatric History: A she has a long history of psychotic disorder. When she is at her worst she has delusions that someone is molesting her by telepathy and she can feel it all over her body. Since she has been on antipsychotics recently she has been doing quite well. She has had past admissions to hospitals and suicide attempts in the past. No history of violence that I'm aware of. 600 mg Seroquel at night and 10 mg of Lexapro has been an effective medication. Patient has had problems in the past getting delirious when she was on benzodiazepines and we have tried to minimize that.  Risk to Self: Is patient at  risk for suicide?: No Risk to Others:   Prior Inpatient Therapy:   Prior Outpatient Therapy:    Past Medical History:  Past Medical History  Diagnosis Date  . Other chronic pain   . Restless legs syndrome (RLS)   . Disorder of bone and cartilage, unspecified   . Unspecified essential hypertension   . Esophageal reflux   . Personal history of malignant neoplasm of cervix uteri   . Irritable bowel syndrome   . Contact dermatitis and other eczema, due to unspecified cause   . Lumbago   . Migraine, unspecified, without mention of intractable migraine without mention of status migrainosus   . Unspecified schizophrenia, unspecified condition   . Personal history of colonic polyps   . Symptomatic menopausal or female climacteric states   . Other and unspecified hyperlipidemia   . Pain in joint, site unspecified   . Hypertonicity of bladder   . Pain in joint, forearm   . History of renal failure and respiratory acidosis (05/24/2014) 11/20/2014    Past Surgical History  Procedure Laterality Date  . Breast lumpectomy      left multiple benign  . Hand surgery  1988    left tendon transfer  . Vaginal hysterectomy  2005  . Bilateral oophorectomy  2005  . Cervical cone biopsy  2005  . Shoulder arthroscopy  2007    right  .  Retinal detachment surgery  2010    right, repair   Family History:  Family History  Problem Relation Age of Onset  . Skin cancer Father     carcinoma, basal cell  . Melanoma Father   . Anemia Mother   . Drug abuse Brother   . Breast cancer Paternal Grandmother   . Diabetes type II Paternal Grandfather   . Other Father     hay fever  . Other Mother     epilepsy   Family Psychiatric  History: Family history he has some positivity for mood symptoms but she is unclear about all of it nothing else reported Social History:  History  Alcohol Use No     History  Drug Use No    Social History   Social History  . Marital Status: Single    Spouse Name:  N/A  . Number of Children: N/A  . Years of Education: N/A   Social History Main Topics  . Smoking status: Former Smoker    Quit date: 06/06/2012  . Smokeless tobacco: Never Used  . Alcohol Use: No  . Drug Use: No  . Sexual Activity: No   Other Topics Concern  . None   Social History Narrative   Additional Social History:                          Allergies:   Allergies  Allergen Reactions  . Alcohol Swelling    overall swelling  . Bextra [Valdecoxib]   . Celebrex [Celecoxib]   . Penicillins     Angioedema & rash  . Sulfa Antibiotics   . Sulfacetamide Sodium   . Vioxx [Rofecoxib]   . Metformin Rash    Sores, ulcers    Labs:  Results for orders placed or performed during the hospital encounter of 12/13/14 (from the past 48 hour(s))  CBC     Status: Abnormal   Collection Time: 12/20/14  4:12 AM  Result Value Ref Range   WBC 6.6 3.6 - 11.0 K/uL   RBC 2.94 (L) 3.80 - 5.20 MIL/uL   Hemoglobin 8.0 (L) 12.0 - 16.0 g/dL   HCT 82.9 (L) 56.2 - 13.0 %   MCV 84.3 80.0 - 100.0 fL   MCH 27.1 26.0 - 34.0 pg   MCHC 32.1 32.0 - 36.0 g/dL   RDW 86.5 (H) 78.4 - 69.6 %   Platelets 128 (L) 150 - 440 K/uL    Current Facility-Administered Medications  Medication Dose Route Frequency Provider Last Rate Last Dose  . acetaminophen (TYLENOL) tablet 650 mg  650 mg Oral Q6H PRN Auburn Bilberry, MD       Or  . acetaminophen (TYLENOL) suppository 650 mg  650 mg Rectal Q6H PRN Auburn Bilberry, MD      . dicyclomine (BENTYL) tablet 20 mg  20 mg Oral Daily Katha Hamming, MD   20 mg at 12/21/14 1053  . escitalopram (LEXAPRO) tablet 10 mg  10 mg Oral Daily Audery Amel, MD   10 mg at 12/21/14 1053  . haloperidol (HALDOL) 2 MG/ML solution 2 mg  2 mg Oral Q6H PRN Shane Crutch, MD      . heparin injection 5,000 Units  5,000 Units Subcutaneous 3 times per day Auburn Bilberry, MD   5,000 Units at 12/21/14 1545  . insulin aspart (novoLOG) injection 5 Units  5 Units Intravenous  Once Auburn Bilberry, MD      . LORazepam (ATIVAN) injection 1  mg  1 mg Intravenous Once Katha HammingSnehalatha Konidena, MD   1 mg at 12/20/14 1416  . midazolam (VERSED) injection 2 mg  2 mg Intravenous Once Stephanie AcreVishal Mungal, MD   2 mg at 12/13/14 1745  . mirabegron ER (MYRBETRIQ) tablet 50 mg  50 mg Oral Daily Katha HammingSnehalatha Konidena, MD   50 mg at 12/21/14 1552  . morphine 2 MG/ML injection 2 mg  2 mg Intravenous Q4H PRN Oralia Manisavid Willis, MD   2 mg at 12/20/14 1716  . nitrofurantoin (macrocrystal-monohydrate) (MACROBID) capsule 100 mg  100 mg Oral Q12H Katha HammingSnehalatha Konidena, MD   100 mg at 12/21/14 1053  . ondansetron (ZOFRAN) injection 4 mg  4 mg Intravenous Q4H PRN Katharina Caperima Vaickute, MD   4 mg at 12/20/14 1719  . oxyCODONE-acetaminophen (PERCOCET/ROXICET) 5-325 MG per tablet 1 tablet  1 tablet Oral Q6H PRN Katha HammingSnehalatha Konidena, MD   1 tablet at 12/21/14 1227   And  . oxyCODONE (Oxy IR/ROXICODONE) immediate release tablet 5 mg  5 mg Oral Q6H PRN Katha HammingSnehalatha Konidena, MD   5 mg at 12/21/14 1227  . pantoprazole (PROTONIX) EC tablet 40 mg  40 mg Oral Daily Katha HammingSnehalatha Konidena, MD   40 mg at 12/21/14 1053  . QUEtiapine (SEROQUEL) tablet 600 mg  600 mg Oral QHS Katha HammingSnehalatha Konidena, MD   600 mg at 12/20/14 2213  . risperiDONE (RISPERDAL M-TABS) disintegrating tablet 1 mg  1 mg Oral QHS Shane CrutchPradeep Ramachandran, MD   1 mg at 12/20/14 2213  . sodium chloride 0.9 % injection 3 mL  3 mL Intravenous Q12H Auburn BilberryShreyang Patel, MD   3 mL at 12/21/14 1054  . ziprasidone (GEODON) injection 10 mg  10 mg Intramuscular Q8H PRN Shane CrutchPradeep Ramachandran, MD        Musculoskeletal: Strength & Muscle Tone: decreased Gait & Station: unable to stand Patient leans: N/A  Psychiatric Specialty Exam: Review of Systems  Constitutional: Negative.   HENT: Negative.   Eyes: Negative.   Respiratory: Negative.   Cardiovascular: Negative.   Gastrointestinal: Negative.   Musculoskeletal: Positive for joint pain and falls.  Skin: Negative.   Neurological:  Negative.   Psychiatric/Behavioral: Negative for depression, suicidal ideas, hallucinations, memory loss and substance abuse. The patient is nervous/anxious. The patient does not have insomnia.     Blood pressure 122/57, pulse 93, temperature 97.8 F (36.6 C), temperature source Oral, resp. rate 17, height 5\' 6"  (1.676 m), weight 103.1 kg (227 lb 4.7 oz), SpO2 98 %.Body mass index is 36.7 kg/(m^2).  General Appearance: Casual  Eye Contact::  Good  Speech:  Normal Rate  Volume:  Normal  Mood:  Euthymic  Affect:  Appropriate  Thought Process:  Goal Directed  Orientation:  Full (Time, Place, and Person)  Thought Content:  Negative  Suicidal Thoughts:  No  Homicidal Thoughts:  No  Memory:  Immediate;   Good Recent;   Fair Remote;   Fair  Judgement:  Intact  Insight:  Present  Psychomotor Activity:  Normal  Concentration:  Fair  Recall:  FiservFair  Fund of Knowledge:Fair  Language: Fair  Akathisia:  No  Handed:  Right  AIMS (if indicated):     Assets:  Communication Skills Desire for Improvement Financial Resources/Insurance Housing Resilience Social Support  ADL's:  Impaired  Cognition: WNL  Sleep:      Treatment Plan Summary: Daily contact with patient to assess and evaluate symptoms and progress in treatment, Medication management and Plan Follow-up again today with this 49 year old patient who has history of  schizophrenia. Evaluated her again today. Patient has no new complaints. States she is feeling emotionally well. Still not having any hallucinations delusions or active psychotic symptoms. She was awake and alert and interactive. Affect upbeat. Thoughts appear to be clear. Tolerating medicine well. Psychiatrically appears to be doing quite well. Supportive counseling done. Reviewed medicine. Patient can follow-up with me after discharge sometime within the next few weeks.  Disposition: Patient does not meet criteria for psychiatric inpatient admission.  Joash Tony 12/21/2014 4:52 PM

## 2014-12-21 NOTE — Progress Notes (Signed)
[facility] - [place] at [facility]                                                                                                                                                                                            Patient Demographics   [name] Haas, is a 49 y.o. female, DOB - [birth year], ZOX:[id number]  Admit date - 12/13/2014   Admitting Physician Auburn Bilberry, MD  Outpatient Primary MD for the patient is [name], Johnney Killian, MD   LOS - 8  C/o of leg pain. No other complaints. She is tolerating the diet. At some nausea this morning but resolved now. Review of Systems:   CONSTITUTIONAL:not  confused anymore.  Review of systems:  Awake alert oriented Cardiovascular; no chest pain  Lungs; no cough, shortness of breath. GI: no  Nausea, no vomiting, no abdominal pain, no diarrhea. Neurological: No headache, no double vision, no weakness in the hands or legs. Psych ;patient right now is not agitated. Extremities: Right foot drop observed. She says she had it for the last 2-3 months. Vitals:   Filed Vitals:   12/20/14 1359 12/20/14 2053 12/21/14 0524 12/21/14 1314  BP: 145/67 144/69 115/56 122/57  Pulse: 85 74 70 93  Temp: 97.7 F (36.5 C) 98.2 F (36.8 C) 98.3 F (36.8 C) 97.8 F (36.6 C)  TempSrc: Oral Oral Oral Oral  Resp: 18 18 16 17   Height:      Weight:      SpO2: 98% 98% 98% 98%    Wt Readings from Last 3 Encounters:  12/13/14 103.1 kg (227 lb 4.7 oz)  11/19/14 96.163 kg (212 lb)  06/07/14 100.336 kg (221 lb 3.2 oz)     Intake/Output Summary (Last 24 hours) at 12/21/14 1334 Last data filed at 12/20/14 1638  Gross per 24 hour  Intake      0 ml  Output    200 ml  Net   -200 ml    Physical Exam:   GENERAL: Critically ill-appearing female HEAD, EYES, EARS, NOSE AND THROAT: Atraumatic, normocephalic. Extraocular muscles are intact. Pupils equal and reactive to light. Sclerae anicteric. No conjunctival injection. No  oro-pharyngeal erythema.  NECK: Supple. There is no jugular venous distention. No bruits, no lymphadenopathy, no thyromegaly.  HEART: Regular rate and rhythm,. No murmurs, no rubs, no clicks.  LUNGS: Clear to auscultation bilaterally. No rales or rhonchi. No wheezes.  ABDOMEN: Soft, flat, nontender, nondistended. Has good bowel sounds. No hepatosplenomegaly appreciated.  EXTREMITIES: No evidence of any cyanosis, clubbing, or peripheral edema.  +2 pedal and radial pulses bilaterally.  Neuro; cranial nerves II-12 grossly  intact strength 5 over 5 in all 4 extremities . Noted to have a right foot drop. SKIN: Moist and warm with no rashes appreciated.  Psych: Not anxious, depressed LN: No inguinal LN enlargement    Antibiotics   Anti-infectives    Start     Dose/Rate Route Frequency Ordered Stop   12/16/14 1500  meropenem (MERREM) 500 mg in sodium chloride 0.9 % 50 mL IVPB  Status:  Discontinued     500 mg 100 mL/hr over 30 Minutes Intravenous 3 times per day 12/16/14 1007 12/19/14 1204   12/13/14 1445  meropenem (MERREM) 500 mg in sodium chloride 0.9 % 50 mL IVPB  Status:  Discontinued     500 mg 100 mL/hr over 30 Minutes Intravenous Every 12 hours 12/13/14 1438 12/16/14 1007   12/13/14 1400  meropenem (MERREM) 1 g in sodium chloride 0.9 % 100 mL IVPB  Status:  Discontinued     1 g 200 mL/hr over 30 Minutes Intravenous 3 times per day 12/13/14 1002 12/13/14 1437   12/13/14 0930  ciprofloxacin (CIPRO) IVPB 400 mg     400 mg 200 mL/hr over 60 Minutes Intravenous  Once 12/13/14 0923 12/13/14 1040      Medications   Scheduled Meds: . dicyclomine  20 mg Oral Daily  . escitalopram  10 mg Oral Daily  . heparin  5,000 Units Subcutaneous 3 times per day  . insulin aspart  5 Units Intravenous Once  . LORazepam  1 mg Intravenous Once  . midazolam  2 mg Intravenous Once  . nitrofurantoin (macrocrystal-monohydrate)  100 mg Oral Q12H  . pantoprazole  40 mg Oral Daily  . QUEtiapine  600 mg  Oral QHS  . risperiDONE  1 mg Oral QHS  . sodium chloride  3 mL Intravenous Q12H   Continuous Infusions:   PRN Meds:.acetaminophen **OR** acetaminophen, haloperidol, morphine injection, ondansetron (ZOFRAN) IV, oxyCODONE-acetaminophen **AND** oxyCODONE, ziprasidone   Data Review:   Micro Results Recent Results (from the past 240 hour(s))  Urine culture     Status: None   Collection Time: 12/13/14  8:30 AM  Result Value Ref Range Status   Specimen Description URINE, CLEAN CATCH  Final   Special Requests NONE  Final   Culture   Final    >=100,000 COLONIES/mL ENTEROCOCCUS FAECALIS VANCOMYCIN RESISTANT ENTEROCOCCUS CRITICAL RESULT CALLED TO, READ BACK BY AND VERIFIED WITH: Adelina Mings AT 5284 ON 12/19/14 CTJ    Report Status 12/19/2014 FINAL  Final   Organism ID, Bacteria ENTEROCOCCUS FAECALIS  Final      Susceptibility   Enterococcus faecalis - MIC*    AMPICILLIN <=2 SENSITIVE Sensitive     NITROFURANTOIN <=16 SENSITIVE Sensitive     LINEZOLID 2 SENSITIVE Sensitive     CIPROFLOXACIN Value in next row Resistant      RESISTANT>=8    LEVOFLOXACIN Value in next row Resistant      RESISTANT>=8    TETRACYCLINE Value in next row Resistant      RESISTANT>=16    VANCOMYCIN Value in next row Resistant      RESISTANT>=16    GENTAMICIN SYNERGY Value in next row Resistant      RESISTANT>=16    * >=100,000 COLONIES/mL ENTEROCOCCUS FAECALIS  MRSA PCR Screening     Status: Abnormal   Collection Time: 12/13/14  3:00 PM  Result Value Ref Range Status   MRSA by PCR POSITIVE (A) NEGATIVE Final    Comment:        The GeneXpert  MRSA Assay (FDA approved for NASAL specimens only), is one component of a comprehensive MRSA colonization surveillance program. It is not intended to diagnose MRSA infection nor to guide or monitor treatment for MRSA infections. CRITICAL RESULT CALLED TO, READ BACK BY AND VERIFIED WITH:  DALE HOPKINS AT 2117 12/13/14 SDR   Culture, blood (routine x 2)      Status: None   Collection Time: 12/13/14  5:25 PM  Result Value Ref Range Status   Specimen Description BLOOD LEFT WRIST  Final   Special Requests   Final    BOTTLES DRAWN AEROBIC AND ANAEROBIC  1CC AERO, 2CC ANAERO   Culture NO GROWTH 5 DAYS  Final   Report Status 12/18/2014 FINAL  Final  Culture, blood (routine x 2)     Status: None   Collection Time: 12/13/14  6:39 PM  Result Value Ref Range Status   Specimen Description BLOOD RIGHT ARM  Final   Special Requests BOTTLES DRAWN AEROBIC AND ANAEROBIC 7CC  Final   Culture NO GROWTH 5 DAYS  Final   Report Status 12/18/2014 FINAL  Final    Radiology Reports Dg Chest 1 View  12/14/2014  CLINICAL DATA:  Acute onset of shortness of breath and hypotension. Initial encounter. EXAM: CHEST 1 VIEW COMPARISON:  Chest radiograph performed 12/13/2014 FINDINGS: The lungs are hypoexpanded. A small left pleural effusion is suspected. Mildly increased central lung markings could reflect minimal interstitial edema or possibly pneumonia. No pneumothorax is seen. The cardiomediastinal silhouette is borderline normal in size. No acute osseous abnormalities are seen. A left shoulder arthroplasty is incompletely imaged, but appears grossly unremarkable. IMPRESSION: Lungs hypoexpanded. Suspect small left pleural effusion. Mildly increased central lung markings may reflect minimal interstitial edema or possibly pneumonia. Electronically Signed   By: Roanna Raider M.D.   On: 12/14/2014 01:58   Dg Chest 2 View  12/13/2014  CLINICAL DATA:  Altered mental status, pale, diaphoretic. EXAM: CHEST  2 VIEW COMPARISON:  Chest x-ray of October 09, 2013 FINDINGS: The lungs are hypoinflated. There is crowding of the pulmonary vascularity. The cardiac silhouette is enlarged. The pulmonary vascularity is indistinct. There is no pleural effusion. 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 IMPRESSION: Bilateral pulmonary hypoinflation. Stable enlargement cardiac silhouette. No definite  pulmonary edema or pneumonia allowing for vascular crowding. Electronically Signed   By: David  [place] M.D.   On: 12/13/2014 09:17   Ct Head Wo Contrast  12/13/2014  CLINICAL DATA:  Altered mental status. EXAM: CT HEAD WITHOUT CONTRAST TECHNIQUE: Contiguous axial images were obtained from the base of the skull through the vertex without intravenous contrast. COMPARISON:  10/08/2013 FINDINGS: Skull and Sinuses:Negative for fracture or destructive process. The visualized mastoids, middle ears, and imaged paranasal sinuses are clear. Orbits: Scleral band on the right.  No acute finding. Brain: No evidence of acute infarction, hemorrhage, hydrocephalus, or mass lesion/mass effect. IMPRESSION: Negative head CT. Electronically Signed   By: Marnee Spring M.D.   On: 12/13/2014 08:33   Mr Lumbar Spine Wo Contrast  12/20/2014  CLINICAL DATA:  49 year old female with lumbar back pain and right foot drop. Symptoms for 2-3 months. Initial encounter. EXAM: MRI LUMBAR SPINE WITHOUT CONTRAST TECHNIQUE: Multiplanar, multisequence MR imaging of the lumbar spine was performed. No intravenous contrast was administered. COMPARISON:  CT Abdomen and Pelvis 10/11/2013. Lumbar MRI 03/31/2011. FINDINGS: Normal lumbar segmentation demonstrated on the 2015 CT. Stable vertebral height and alignment. Mild retrolisthesis at L5-S1. No marrow edema or evidence of acute osseous abnormality. Visualized  lower thoracic spinal cord is normal with conus medularis at L1-L2. No acute findings of the visible abdominal viscera. Negative visualized posterior paraspinal soft tissues. T11-T12:  Minimal disc desiccation.  Otherwise negative. T12-L1:  Negative. L1-L2:  Mild chronic posterior disc bulge.  No stenosis. L2-L3: Negative disc. Stable borderline to mild facet hypertrophy and no stenosis. L3-L4: Increased left foraminal disc extrusion (series 5, image 21 and series 2, images 12 and 13). Moderate to severe left L3 neural foraminal stenosis has  increased. Superimposed mild facet hypertrophy and underlying circumferential disc bulge appears stable. No left lateral recess stenosis. No significant right foraminal stenosis. L4-L5: Mild circumferential disc bulge, facet hypertrophy common ligament flavum hypertrophy appears stable. There is left greater than right chronic foraminal involvement by disc. No spinal or lateral recess stenosis. Mild left L4 foraminal stenosis is stable. L5-S1: Stable disc bulge with mild broad-based posterior component. Stable mild to moderate facet hypertrophy. No spinal or lateral recess stenosis. Mild bilateral L5 foraminal stenosis appears stable since 2013. IMPRESSION: 1. Progressive left foraminal disc extrusion at L3-L4 now with moderate to severe left L3 foraminal stenosis. 2. No lumbar spinal stenosis and other lumbar levels appear stable since 2013, including mild multifactorial left L4-L5 and bilateral L5-S1 foraminal stenosis. Electronically Signed   By: Odessa Fleming M.D.   On: 12/20/2014 15:41   US Renal  12/13/2014  CLINICAL DATA:  49 year old female with acute renal failure. Initial encounter. EXAM: RENAL / URINARY TRACT ULTRASOUND COMPLETE COMPARISON:  10/11/2013. FINDINGS: Right Kidney: Evaluation limited by patient's habitus and patient unable to cooperate. Atrophic lobular appearance of the right kidney without hydronephrosis. 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 If further delineation of the right kidney is clinically desired, CT imaging (can be performed without contrast) may be considered. Left Kidney: Length: 9.2 cm. Echogenicity within normal limits. No mass or hydronephrosis visualized. Bladder: Appears normal for degree of bladder distention. IMPRESSION: Exam limited.  Particularly limited evaluation of the right kidney. Right kidney appears atrophic and lobulated. Follow-up as noted above. No hydronephrosis detected. Electronically Signed   By: Lacy Duverney M.D.   On: 12/13/2014  11:02     CBC  Recent Labs Lab 12/16/14 0455 12/17/14 0509 12/20/14 0412  WBC 3.7 6.7 6.6  HGB 7.8* 9.4* 8.0*  HCT 23.5* 30.3* 24.8*  PLT 136* 152 128*  MCV 86.2 87.4 84.3  MCH 28.6 27.3 27.1  MCHC 33.2 31.2* 32.1  RDW 15.9* 16.2* 15.8*    Chemistries   Recent Labs Lab 12/14/14 2204 12/15/14 0455 12/15/14 1311 12/16/14 0455 12/17/14 0509  NA 136 138 139 142 142  K 5.1 5.1 4.5 4.4 4.6  CL 104 104 107 109 109  CO2 19* GLUCOSE 159* 137* 115* 96 92  BUN 39* 36* 32* 27* 21*  CREATININE 2.85* 2.16* 1.79* 1.37* 1.18*  CALCIUM 8.3* 8.4* 8.4* 8.4* 8.6*  MG  --   --   --  1.9 2.2  AST  --   --  24  --   --   ALT  --   --  15  --   --   ALKPHOS  --   --  71  --   --   BILITOT  --   --  0.8  --   --    ------------------------------------------------------------------------------------------------------------------ estimated creatinine clearance is 68.4 mL/min (by C-G formula based on Cr of 1.18). ------------------------------------------------------------------------------------------------------------------ No results for input(s): HGBA1C in the last 72 hours. ------------------------------------------------------------------------------------------------------------------  No results for input(s): CHOL, HDL, LDLCALC, TRIG, CHOLHDL, LDLDIRECT in the last 72 hours. ------------------------------------------------------------------------------------------------------------------ No results for input(s): TSH, T4TOTAL, T3FREE, THYROIDAB in the last 72 hours.  Invalid input(s): FREET3 ------------------------------------------------------------------------------------------------------------------ No results for input(s): VITAMINB12, FOLATE, FERRITIN, TIBC, IRON, RETICCTPCT in the last 72 hours.  Coagulation profile No results for input(s): INR, PROTIME in the last 168 hours.  No results for input(s): DDIMER in the last 72 hours.  Cardiac Enzymes No results  for input(s): CKMB, TROPONINI, MYOGLOBIN in the last 168 hours.  Invalid input(s): CK ------------------------------------------------------------------------------------------------------------------ Invalid input(s): POCBNP    Assessment & Plan   IMPRESSION AND PLAN: Patient is a 49 year old white female brought in by family due to confusion 1. Acute encephalopathy: The combination of acute renal failure and ATN.   Encephalopathy resolved.    Nausea,vomting diarrhea: Possible viral gastroenteritis:  resolved. Start on regular diet. Tolerating the diet.  2. Hypotension could be related to urinary tract infection and sepsis. Sepsis secondary to VRE in the urine. The Macrobid for 21 days.  3. Acute renal failure with severe hyperkalemia;, metabolic acidosis with metabolic encephalopathy:patient received bicarbonate drip. It is post emergency hemodialysis. Renal function improved with good urine output.possible rhabdomyolysis on admission  Along with NSAId use;was taking diclofenac gel,but CPK was not checked. Renal Failure to improved. Discontinue dialysis catheter, Discontinue Foley,   Potassium is better, renal function is better   6.  Bipolar disorder ; restarted her medications, psychiatric following the patient.   7.  IBS: Continue Bentyl. #8 history of gastritis and duodenitis before she is on PPIs. #9 right foot drop: Supposed to have MRI of the lumbosacral spine showed foraminal stenosis.  #10 out of bed to chair, no physical therapy restrictions. Disposition according to physical therapy evaluation.  Disposition depends on physical therapy evaluation  Discussed with patient's father.    Code Status Orders        Start     Ordered   12/13/14 1510  Full code   Continuous     12/13/14 1509           Consults  nephrologlo  DVT Prophylaxis heparin  Lab Results  Component Value Date   PLT 128* 12/20/2014     Time Spent in minutes ; 35  minutes .Katha HammingKONIDENA,Andreina Outten M.D on 12/21/2014 at 1:34 PM  Between 7am to 6pm - Pager - [phone]  After 6pm go to [email or web address] - password [email or web address] [facility]  [facility]   Office  [phone]

## 2014-12-22 LAB — BASIC METABOLIC PANEL
ANION GAP: 10 (ref 5–15)
Anion gap: 8 (ref 5–15)
BUN: 5 mg/dL — AB (ref 6–20)
CALCIUM: 8.2 mg/dL — AB (ref 8.9–10.3)
CALCIUM: 8.2 mg/dL — AB (ref 8.9–10.3)
CO2: 27 mmol/L (ref 22–32)
CO2: 28 mmol/L (ref 22–32)
CREATININE: 0.84 mg/dL (ref 0.44–1.00)
Chloride: 100 mmol/L — ABNORMAL LOW (ref 101–111)
Chloride: 102 mmol/L (ref 101–111)
Creatinine, Ser: 0.76 mg/dL (ref 0.44–1.00)
GFR calc Af Amer: >60 mL/min (ref >60)
GFR calc Af Amer: >60 mL/min (ref >60)
GLUCOSE: 108 mg/dL — AB (ref 65–99)
GLUCOSE: 111 mg/dL — AB (ref 65–99)
Potassium: 2.6 mmol/L — CL (ref 3.5–5.1)
Potassium: 2.8 mmol/L — CL (ref 3.5–5.1)
SODIUM: 137 mmol/L (ref 135–145)
SODIUM: 138 mmol/L (ref 135–145)

## 2014-12-22 LAB — CBC
HCT: 25.1 % — ABNORMAL LOW (ref 35.0–47.0)
Hemoglobin: 7.9 g/dL — ABNORMAL LOW (ref 12.0–16.0)
MCH: 26.6 pg (ref 26.0–34.0)
MCHC: 31.3 g/dL — ABNORMAL LOW (ref 32.0–36.0)
MCV: 84.9 fL (ref 80.0–100.0)
PLATELETS: 158 10*3/uL (ref 150–440)
RBC: 2.96 MIL/uL — ABNORMAL LOW (ref 3.80–5.20)
RDW: 16.3 % — AB (ref 11.5–14.5)
WBC: 5.5 10*3/uL (ref 3.6–11.0)

## 2014-12-22 MED ORDER — POTASSIUM CHLORIDE CRYS ER 20 MEQ PO TBCR
40.0000 meq | EXTENDED_RELEASE_TABLET | ORAL | Status: AC
  Administered 2014-12-22 (×2): 40 meq via ORAL
  Filled 2014-12-22 (×2): qty 2

## 2014-12-22 MED ORDER — NITROFURANTOIN MONOHYD MACRO 100 MG PO CAPS: 100.0000 mg | ORAL_CAPSULE | Freq: Two times a day (BID) | ORAL | Status: DC

## 2014-12-22 MED ORDER — RISPERIDONE 1 MG PO TBDP: 1.0000 mg | ORAL_TABLET | Freq: Every day | ORAL | Status: DC

## 2014-12-22 MED ORDER — ESCITALOPRAM OXALATE 10 MG PO TABS: 10.0000 mg | ORAL_TABLET | Freq: Every day | ORAL | Status: DC

## 2014-12-22 NOTE — Care Management Note (Signed)
Case Management Note  Patient Details  Name: [name] Haas MRN: [id number] Date of Birth: November 27, 1965  Subjective/Objective:     Home Health orders at [facility] were continued.  Request faxed and called to [facility].               Action/Plan:   Expected Discharge Date:                  Expected Discharge Plan:     In-House Referral:     Discharge planning Services     Post Acute Care Choice:    Choice offered to:     DME Arranged:    DME Agency:     HH Arranged:    HH Agency:     Status of Service:     Medicare Important Message Given:  Yes Date Medicare IM Given:    Medicare IM give by:    Date Additional Medicare IM Given:    Additional Medicare Important Message give by:     If discussed at Long Length of Stay Meetings, dates discussed:    Additional Comments:  [name] A, RN 12/22/2014, 2:04 PM

## 2014-12-22 NOTE — Discharge Instructions (Signed)
Please call your primary care doctor on Monday to make next available appointment. Your appointment should be within 2 weeks of discharge.   DIET:  Regular diet  DISCHARGE CONDITION:  Fair  ACTIVITY:  Activity as tolerated  OXYGEN:  Home Oxygen: No.   Oxygen Delivery: room air  DISCHARGE LOCATION:  home   If you experience worsening of your admission symptoms, develop shortness of breath, life threatening emergency, suicidal or homicidal thoughts you must seek medical attention immediately by calling 911 or calling your MD immediately  if symptoms less severe.  You Must read complete instructions/literature along with all the possible adverse reactions/side effects for all the Medicines you take and that have been prescribed to you. Take any new Medicines after you have completely understood and accpet all the possible adverse reactions/side effects.   Please note  You were cared for by a hospitalist during your hospital stay. If you have any questions about your discharge medications or the care you received while you were in the hospital after you are discharged, you can call the unit and asked to speak with the hospitalist on call if the hospitalist that took care of you is not available. Once you are discharged, your primary care physician will handle any further medical issues. Please note that NO REFILLS for any discharge medications will be authorized once you are discharged, as it is imperative that you return to your primary care physician (or establish a relationship with a primary care physician if you do not have one) for your aftercare needs so that they can reassess your need for medications and monitor your lab values.

## 2014-12-22 NOTE — Progress Notes (Signed)
Per lab pt had a critical potassium level of 2.6. MD notified in person and ordered replacement

## 2014-12-22 NOTE — Progress Notes (Addendum)
Per lab pt still with critical potassium level of 2.8. MD notified. No new orders

## 2014-12-23 LAB — CBC
HEMATOCRIT: 25.6 % — AB (ref 35.0–47.0)
HEMOGLOBIN: 8.5 g/dL — AB (ref 12.0–16.0)
MCH: 27.6 pg (ref 26.0–34.0)
MCHC: 33.1 g/dL (ref 32.0–36.0)
MCV: 83.2 fL (ref 80.0–100.0)
Platelets: 141 10*3/uL — ABNORMAL LOW (ref 150–440)
RBC: 3.08 MIL/uL — AB (ref 3.80–5.20)
RDW: 16.3 % — ABNORMAL HIGH (ref 11.5–14.5)
WBC: 5.4 10*3/uL (ref 3.6–11.0)

## 2014-12-23 LAB — BASIC METABOLIC PANEL
Anion gap: 7 (ref 5–15)
BUN: 7 mg/dL (ref 6–20)
CHLORIDE: 107 mmol/L (ref 101–111)
CO2: 25 mmol/L (ref 22–32)
Calcium: 8.3 mg/dL — ABNORMAL LOW (ref 8.9–10.3)
Creatinine, Ser: 0.77 mg/dL (ref 0.44–1.00)
GFR calc Af Amer: >60 mL/min (ref >60)
GFR calc non Af Amer: >60 mL/min (ref >60)
Glucose, Bld: 97 mg/dL (ref 65–99)
POTASSIUM: 3.6 mmol/L (ref 3.5–5.1)
SODIUM: 139 mmol/L (ref 135–145)

## 2014-12-23 LAB — MAGNESIUM: MAGNESIUM: 1.6 mg/dL — AB (ref 1.7–2.4)

## 2014-12-23 MED ORDER — MAGNESIUM SULFATE 2 GM/50ML IV SOLN
2.0000 g | Freq: Once | INTRAVENOUS | Status: AC
  Administered 2014-12-23: 2 g via INTRAVENOUS
  Filled 2014-12-23: qty 50

## 2014-12-23 NOTE — Progress Notes (Addendum)
Pt to be discharged per MD order. IV removed. Instructions reviewed with pt and all questions answered.

## 2014-12-23 NOTE — Progress Notes (Signed)
[facility] [place] [facility]                                                                                                                                                                                            Patient Demographics   [name] Haas, is a 49 y.o. female, DOB - 1965-10-13, ZOX:[id number]  Admit date - 12/13/2014   Admitting Physician Auburn Bilberry, MD  Outpatient Primary MD for the patient is [name], Johnney Killian, MD   LOS - 10  No complaints  Review of Systems:   CONSTITUTIONAL:not  confused anymore.  Review of systems:  Awake alert oriented Cardiovascular; no chest pain  Lungs; no cough, shortness of breath. GI: no  Nausea, no vomiting, no abdominal pain, no diarrhea. Neurological: No headache, no double vision, no weakness in the hands or legs. Psych ;patient right now is not agitated. Extremities: Right foot drop observed. She says she had it for the last 2-3 months. Vitals:   Filed Vitals:   12/22/14 2111 12/22/14 2348 12/23/14 0701 12/23/14 1405  BP: 92/55 129/62 114/40 121/57  Pulse: 83 91 81 89  Temp: 98.7 F (37.1 C)  97.7 F (36.5 C) 98.2 F (36.8 C)  TempSrc: Oral  Oral Oral  Resp: Height:      Weight:      SpO2: 97% 97% 98% 99%    Wt Readings from Last 3 Encounters:  12/13/14 103.1 kg (227 lb 4.7 oz)  11/19/14 96.163 kg (212 lb)  06/07/14 100.336 kg (221 lb 3.2 oz)     Intake/Output Summary (Last 24 hours) at 12/23/14 1509 Last data filed at 12/23/14 0655  Gross per 24 hour  Intake    663 ml  Output      0 ml  Net    663 ml    Physical Exam:   GENERAL:No distress, comfortable  HEAD, EYES, EARS, NOSE AND THROAT: Atraumatic, normocephalic. Extraocular muscles are intact. Pupils equal and reactive to light. Sclerae anicteric. No conjunctival injection. No oro-pharyngeal erythema.  NECK: Supple. There is no jugular venous distention. No bruits, no lymphadenopathy, no thyromegaly.  HEART:  Regular rate and rhythm,. No murmurs, no rubs, no clicks.  LUNGS: Clear to auscultation bilaterally. No rales or rhonchi. No wheezes.  ABDOMEN: Soft, flat, nontender, nondistended. Has good bowel sounds. No hepatosplenomegaly appreciated.  EXTREMITIES: No evidence of any cyanosis, clubbing, or peripheral edema.  +2 pedal and radial pulses bilaterally.  Neuro; cranial nerves II-12 grossly intact strength 5 over 5 in all 4 extremities . Noted to have a right foot drop. SKIN: Moist  and warm with no rashes appreciated.  Psych: Not anxious, depressed LN: No inguinal LN enlargement    Antibiotics   Anti-infectives    Start     Dose/Rate Route Frequency Ordered Stop   12/16/14 1500  meropenem (MERREM) 500 mg in sodium chloride 0.9 % 50 mL IVPB  Status:  Discontinued     500 mg 100 mL/hr over 30 Minutes Intravenous 3 times per day 12/16/14 1007 12/19/14 1204   12/13/14 1445  meropenem (MERREM) 500 mg in sodium chloride 0.9 % 50 mL IVPB  Status:  Discontinued     500 mg 100 mL/hr over 30 Minutes Intravenous Every 12 hours 12/13/14 1438 12/16/14 1007   12/13/14 1400  meropenem (MERREM) 1 g in sodium chloride 0.9 % 100 mL IVPB  Status:  Discontinued     1 g 200 mL/hr over 30 Minutes Intravenous 3 times per day 12/13/14 1002 12/13/14 1437   12/13/14 0930  ciprofloxacin (CIPRO) IVPB 400 mg     400 mg 200 mL/hr over 60 Minutes Intravenous  Once 12/13/14 0923 12/13/14 1040      Medications   Scheduled Meds: . dicyclomine  20 mg Oral Daily  . escitalopram  10 mg Oral Daily  . heparin  5,000 Units Subcutaneous 3 times per day  . insulin aspart  5 Units Intravenous Once  . LORazepam  1 mg Intravenous Once  . midazolam  2 mg Intravenous Once  . mirabegron ER  50 mg Oral Daily  . nitrofurantoin (macrocrystal-monohydrate)  100 mg Oral Q12H  . pantoprazole  40 mg Oral Daily  . QUEtiapine  600 mg Oral QHS  . risperiDONE  1 mg Oral QHS  . sodium chloride  3 mL Intravenous Q12H   Continuous  Infusions:   PRN Meds:.acetaminophen **OR** acetaminophen, haloperidol, morphine injection, ondansetron (ZOFRAN) IV, oxyCODONE-acetaminophen **AND** oxyCODONE, ziprasidone   Data Review:   Micro Results Recent Results (from the past 240 hour(s))  Culture, blood (routine x 2)     Status: None   Collection Time: 12/13/14  5:25 PM  Result Value Ref Range Status   Specimen Description BLOOD LEFT WRIST  Final   Special Requests   Final    BOTTLES DRAWN AEROBIC AND ANAEROBIC  1CC AERO, 2CC ANAERO   Culture NO GROWTH 5 DAYS  Final   Report Status 12/18/2014 FINAL  Final  Culture, blood (routine x 2)     Status: None   Collection Time: 12/13/14  6:39 PM  Result Value Ref Range Status   Specimen Description BLOOD RIGHT ARM  Final   Special Requests BOTTLES DRAWN AEROBIC AND ANAEROBIC 7CC  Final   Culture NO GROWTH 5 DAYS  Final   Report Status 12/18/2014 FINAL  Final    Radiology Reports Dg Chest 1 View  12/14/2014  CLINICAL DATA:  Acute onset of shortness of breath and hypotension. Initial encounter. EXAM: CHEST 1 VIEW COMPARISON:  Chest radiograph performed 12/13/2014 FINDINGS: The lungs are hypoexpanded. A small left pleural effusion is suspected. Mildly increased central lung markings could reflect minimal interstitial edema or possibly pneumonia. No pneumothorax is seen. The cardiomediastinal silhouette is borderline normal in size. No acute osseous abnormalities are seen. A left shoulder arthroplasty is incompletely imaged, but appears grossly unremarkable. IMPRESSION: Lungs hypoexpanded. Suspect small left pleural effusion. Mildly increased central lung markings may reflect minimal interstitial edema or possibly pneumonia. Electronically Signed   By: Roanna RaiderJeffery  Chang M.D.   On: 12/14/2014 01:58   Dg Chest 2 View  12/13/2014  CLINICAL DATA:  Altered mental status, pale, diaphoretic. EXAM: CHEST  2 VIEW COMPARISON:  Chest x-ray of October 09, 2013 FINDINGS: The lungs are hypoinflated. There  is crowding of the pulmonary vascularity. The cardiac silhouette is enlarged. The pulmonary vascularity is indistinct. There is no pleural effusion. 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 IMPRESSION: Bilateral pulmonary hypoinflation. Stable enlargement cardiac silhouette. No definite pulmonary edema or pneumonia allowing for vascular crowding. Electronically Signed   By: David  [place] M.D.   On: 12/13/2014 09:17   Ct Head Wo Contrast  12/13/2014  CLINICAL DATA:  Altered mental status. EXAM: CT HEAD WITHOUT CONTRAST TECHNIQUE: Contiguous axial images were obtained from the base of the skull through the vertex without intravenous contrast. COMPARISON:  10/08/2013 FINDINGS: Skull and Sinuses:Negative for fracture or destructive process. The visualized mastoids, middle ears, and imaged paranasal sinuses are clear. Orbits: Scleral band on the right.  No acute finding. Brain: No evidence of acute infarction, hemorrhage, hydrocephalus, or mass lesion/mass effect. IMPRESSION: Negative head CT. Electronically Signed   By: Marnee Spring M.D.   On: 12/13/2014 08:33   Mr Lumbar Spine Wo Contrast  12/20/2014  CLINICAL DATA:  49 year old female with lumbar back pain and right foot drop. Symptoms for 2-3 months. Initial encounter. EXAM: MRI LUMBAR SPINE WITHOUT CONTRAST TECHNIQUE: Multiplanar, multisequence MR imaging of the lumbar spine was performed. No intravenous contrast was administered. COMPARISON:  CT Abdomen and Pelvis 10/11/2013. Lumbar MRI 03/31/2011. FINDINGS: Normal lumbar segmentation demonstrated on the 2015 CT. Stable vertebral height and alignment. Mild retrolisthesis at L5-S1. No marrow edema or evidence of acute osseous abnormality. Visualized lower thoracic spinal cord is normal with conus medularis at L1-L2. No acute findings of the visible abdominal viscera. Negative visualized posterior paraspinal soft tissues. T11-T12:  Minimal disc desiccation.  Otherwise negative. T12-L1:  Negative. L1-L2:  Mild  chronic posterior disc bulge.  No stenosis. L2-L3: Negative disc. Stable borderline to mild facet hypertrophy and no stenosis. L3-L4: Increased left foraminal disc extrusion (series 5, image 21 and series 2, images 12 and 13). Moderate to severe left L3 neural foraminal stenosis has increased. Superimposed mild facet hypertrophy and underlying circumferential disc bulge appears stable. No left lateral recess stenosis. No significant right foraminal stenosis. L4-L5: Mild circumferential disc bulge, facet hypertrophy common ligament flavum hypertrophy appears stable. There is left greater than right chronic foraminal involvement by disc. No spinal or lateral recess stenosis. Mild left L4 foraminal stenosis is stable. L5-S1: Stable disc bulge with mild broad-based posterior component. Stable mild to moderate facet hypertrophy. No spinal or lateral recess stenosis. Mild bilateral L5 foraminal stenosis appears stable since 2013. IMPRESSION: 1. Progressive left foraminal disc extrusion at L3-L4 now with moderate to severe left L3 foraminal stenosis. 2. No lumbar spinal stenosis and other lumbar levels appear stable since 2013, including mild multifactorial left L4-L5 and bilateral L5-S1 foraminal stenosis. Electronically Signed   By: Odessa Fleming M.D.   On: 12/20/2014 15:41   US Renal  12/13/2014  CLINICAL DATA:  49 year old female with acute renal failure. Initial encounter. EXAM: RENAL / URINARY TRACT ULTRASOUND COMPLETE COMPARISON:  10/11/2013. FINDINGS: Right Kidney: Evaluation limited by patient's habitus and patient unable to cooperate. Atrophic lobular appearance of the right kidney without hydronephrosis. 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 If further delineation of the right kidney is clinically desired, CT imaging (can be performed without contrast) may be considered. Left Kidney: Length: 9.2 cm. Echogenicity within normal limits. No mass or hydronephrosis  visualized. Bladder: Appears  normal for degree of bladder distention. IMPRESSION: Exam limited.  Particularly limited evaluation of the right kidney. Right kidney appears atrophic and lobulated. Follow-up as noted above. No hydronephrosis detected. Electronically Signed   By: Lacy Duverney M.D.   On: 12/13/2014 11:02     CBC  Recent Labs Lab 12/17/14 0509 12/20/14 0412 12/22/14 0606 12/23/14 0801  WBC 6.7 6.6 5.5 5.4  HGB 9.4* 8.0* 7.9* 8.5*  HCT 30.3* 24.8* 25.1* 25.6*  PLT 152 128* 158 141*  MCV 87.4 84.3 84.9 83.2  MCH 27.3 27.1 26.6 27.6  MCHC 31.2* 32.1 31.3* 33.1  RDW 16.2* 15.8* 16.3* 16.3*    Chemistries   Recent Labs Lab 12/17/14 0509 12/22/14 0606 12/22/14 1458 12/23/14 0801  NA 142 137 138 139  K 4.6 2.6* 2.8* 3.6  CL 109 100* 102 107  CO2 22 27 28 25   GLUCOSE 92 108* 111* 97  BUN 21* 5* <5* 7  CREATININE 1.18* 0.76 0.84 0.77  CALCIUM 8.6* 8.2* 8.2* 8.3*  MG 2.2  --   --  1.6*   ------------------------------------------------------------------------------------------------------------------ estimated creatinine clearance is 100.9 mL/min (by C-G formula based on Cr of 0.77). ------------------------------------------------------------------------------------------------------------------ No results for input(s): HGBA1C in the last 72 hours. ------------------------------------------------------------------------------------------------------------------ No results for input(s): CHOL, HDL, LDLCALC, TRIG, CHOLHDL, LDLDIRECT in the last 72 hours. ------------------------------------------------------------------------------------------------------------------ No results for input(s): TSH, T4TOTAL, T3FREE, THYROIDAB in the last 72 hours.  Invalid input(s): FREET3 ------------------------------------------------------------------------------------------------------------------ No results for input(s): VITAMINB12, FOLATE, FERRITIN, TIBC, IRON, RETICCTPCT in the last 72 hours.  Coagulation  profile No results for input(s): INR, PROTIME in the last 168 hours.  No results for input(s): DDIMER in the last 72 hours.  Cardiac Enzymes No results for input(s): CKMB, TROPONINI, MYOGLOBIN in the last 168 hours.  Invalid input(s): CK ------------------------------------------------------------------------------------------------------------------ Invalid input(s): POCBNP    Assessment & Plan   IMPRESSION AND PLAN: Patient is a 49 year old white female brought in by family due to confusion  1. Acute encephalopathy: Resolved. Likely due to acute illness.  2. 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   3. Acute renal failure with severe hyperkalemia; resolved   4. Hypokalemia: Replace. Recheck in the morning.   6.  Bipolar disorder ; restarted her medications, psychiatric following the patient.She is currently alert, oriented, in good spirits   7.  IBS: Continue Bentyl. #8 history of gastritis and duodenitis before she is on PPIs. #9 right foot drop:  MRI of the lumbosacral spine showed foraminal stenosis.  #10 out of bed to chair, no physical therapy restrictions.  Discussed with patient's father.    Code Status Orders        Start     Ordered   12/13/14 1510  Full code   Continuous     12/13/14 1509      Consults nephrology, infectious disease  DVT Prophylaxis heparin  Lab Results  Component Value Date   PLT 141* 12/23/2014     Time Spent in minutes ; 35 minutes .Elby Showers M.D on 12/23/2014 at 3:09 PM  Between 7am to 6pm - Pager - [phone]  After 6pm go to [email or web address] - password [email or web address] [facility]  [facility]   Office  [phone]

## 2014-12-24 ENCOUNTER — Other Ambulatory Visit: Payer: Self-pay | Admitting: Psychiatry

## 2014-12-24 MED ORDER — NITROFURANTOIN MONOHYD MACRO 100 MG PO CAPS: 100.0000 mg | ORAL_CAPSULE | Freq: Two times a day (BID) | ORAL | Status: AC

## 2014-12-24 MED ORDER — RISPERIDONE 1 MG PO TBDP: 1.0000 mg | ORAL_TABLET | Freq: Every day | ORAL | Status: DC

## 2014-12-24 MED ORDER — ESCITALOPRAM OXALATE 10 MG PO TABS: 10.0000 mg | ORAL_TABLET | Freq: Every day | ORAL | Status: DC

## 2014-12-26 ENCOUNTER — Other Ambulatory Visit: Payer: Self-pay | Admitting: Psychiatry

## 2015-01-03 ENCOUNTER — Other Ambulatory Visit: Payer: Self-pay | Admitting: Pain Medicine

## 2015-01-03 ENCOUNTER — Other Ambulatory Visit: Payer: Self-pay

## 2015-01-03 ENCOUNTER — Encounter: Payer: Self-pay | Admitting: Pain Medicine

## 2015-01-03 ENCOUNTER — Ambulatory Visit: Payer: Medicare Other | Attending: Pain Medicine | Admitting: Pain Medicine

## 2015-01-03 VITALS — BP 107/77 | HR 95 | Temp 98.4°F | Resp 18 | Ht 65.0 in | Wt 212.0 lb

## 2015-01-03 DIAGNOSIS — M79673 Pain in unspecified foot: Secondary | ICD-10-CM | POA: Diagnosis present

## 2015-01-03 DIAGNOSIS — S8292XS Unspecified fracture of left lower leg, sequela: Secondary | ICD-10-CM

## 2015-01-03 DIAGNOSIS — M79604 Pain in right leg: Secondary | ICD-10-CM | POA: Insufficient documentation

## 2015-01-03 DIAGNOSIS — E1165 Type 2 diabetes mellitus with hyperglycemia: Secondary | ICD-10-CM | POA: Diagnosis not present

## 2015-01-03 DIAGNOSIS — R51 Headache: Secondary | ICD-10-CM | POA: Diagnosis not present

## 2015-01-03 DIAGNOSIS — K219 Gastro-esophageal reflux disease without esophagitis: Secondary | ICD-10-CM | POA: Insufficient documentation

## 2015-01-03 DIAGNOSIS — S82492S Other fracture of shaft of left fibula, sequela: Secondary | ICD-10-CM | POA: Diagnosis not present

## 2015-01-03 DIAGNOSIS — Z5181 Encounter for therapeutic drug level monitoring: Secondary | ICD-10-CM

## 2015-01-03 DIAGNOSIS — F319 Bipolar disorder, unspecified: Secondary | ICD-10-CM | POA: Diagnosis not present

## 2015-01-03 DIAGNOSIS — F1190 Opioid use, unspecified, uncomplicated: Secondary | ICD-10-CM | POA: Diagnosis not present

## 2015-01-03 DIAGNOSIS — F209 Schizophrenia, unspecified: Secondary | ICD-10-CM | POA: Insufficient documentation

## 2015-01-03 DIAGNOSIS — R94131 Abnormal electromyogram [EMG]: Secondary | ICD-10-CM

## 2015-01-03 DIAGNOSIS — G5791 Unspecified mononeuropathy of right lower limb: Secondary | ICD-10-CM | POA: Insufficient documentation

## 2015-01-03 DIAGNOSIS — X58XXXA Exposure to other specified factors, initial encounter: Secondary | ICD-10-CM | POA: Diagnosis not present

## 2015-01-03 DIAGNOSIS — F331 Major depressive disorder, recurrent, moderate: Secondary | ICD-10-CM | POA: Insufficient documentation

## 2015-01-03 DIAGNOSIS — S82292S Other fracture of shaft of left tibia, sequela: Secondary | ICD-10-CM | POA: Diagnosis not present

## 2015-01-03 DIAGNOSIS — M12812 Other specific arthropathies, not elsewhere classified, left shoulder: Secondary | ICD-10-CM | POA: Insufficient documentation

## 2015-01-03 DIAGNOSIS — G5701 Lesion of sciatic nerve, right lower limb: Secondary | ICD-10-CM

## 2015-01-03 DIAGNOSIS — S82202S Unspecified fracture of shaft of left tibia, sequela: Secondary | ICD-10-CM

## 2015-01-03 DIAGNOSIS — G629 Polyneuropathy, unspecified: Secondary | ICD-10-CM | POA: Insufficient documentation

## 2015-01-03 DIAGNOSIS — I10 Essential (primary) hypertension: Secondary | ICD-10-CM | POA: Diagnosis not present

## 2015-01-03 DIAGNOSIS — G710 Muscular dystrophy: Secondary | ICD-10-CM

## 2015-01-03 DIAGNOSIS — E785 Hyperlipidemia, unspecified: Secondary | ICD-10-CM | POA: Diagnosis not present

## 2015-01-03 DIAGNOSIS — G5731 Lesion of lateral popliteal nerve, right lower limb: Secondary | ICD-10-CM | POA: Diagnosis not present

## 2015-01-03 DIAGNOSIS — G8929 Other chronic pain: Secondary | ICD-10-CM

## 2015-01-03 DIAGNOSIS — R937 Abnormal findings on diagnostic imaging of other parts of musculoskeletal system: Secondary | ICD-10-CM | POA: Insufficient documentation

## 2015-01-03 DIAGNOSIS — M21371 Foot drop, right foot: Secondary | ICD-10-CM

## 2015-01-03 DIAGNOSIS — S82402S Unspecified fracture of shaft of left fibula, sequela: Secondary | ICD-10-CM

## 2015-01-03 DIAGNOSIS — Z87891 Personal history of nicotine dependence: Secondary | ICD-10-CM | POA: Insufficient documentation

## 2015-01-03 DIAGNOSIS — F0390 Unspecified dementia without behavioral disturbance: Secondary | ICD-10-CM | POA: Diagnosis not present

## 2015-01-03 DIAGNOSIS — Z79891 Long term (current) use of opiate analgesic: Secondary | ICD-10-CM

## 2015-01-03 DIAGNOSIS — R339 Retention of urine, unspecified: Secondary | ICD-10-CM | POA: Insufficient documentation

## 2015-01-03 DIAGNOSIS — F329 Major depressive disorder, single episode, unspecified: Secondary | ICD-10-CM | POA: Insufficient documentation

## 2015-01-03 DIAGNOSIS — G7109 Other specified muscular dystrophies: Secondary | ICD-10-CM | POA: Insufficient documentation

## 2015-01-03 MED ORDER — MORPHINE SULFATE ER 15 MG PO TBCR: 15.0000 mg | EXTENDED_RELEASE_TABLET | Freq: Two times a day (BID) | ORAL | Status: DC

## 2015-01-03 MED ORDER — OXYCODONE HCL 10 MG PO TABS: 10.0000 mg | ORAL_TABLET | Freq: Three times a day (TID) | ORAL | Status: DC | PRN

## 2015-01-03 MED ORDER — TIZANIDINE HCL 4 MG PO TABS: 4.0000 mg | ORAL_TABLET | Freq: Three times a day (TID) | ORAL | Status: DC | PRN

## 2015-01-03 NOTE — Telephone Encounter (Signed)
pt states she needs a refill on seroquel 300 take bid and on haldol 2md take 1 qd.  pt last seen on  06-07-14 next appt  01-15-15.  [facility] and that she came back home on  11-04-14.

## 2015-01-03 NOTE — Patient Instructions (Signed)

## 2015-01-03 NOTE — Progress Notes (Signed)
Safety precautions to be maintained throughout the outpatient stay will include: orient to surroundings, keep bed in low position, maintain call bell within reach at all times, provide assistance with transfer out of bed and ambulation.  [facility] 11/16 Pt states she takes:  Morphine sulf 15mg  33/60 filled 11/20/14    oxycodone 10mg   3 times a day -- bottle reads 1tab every 4 hours not to exceed 5  Filled 02/23/14  29/150remain

## 2015-01-03 NOTE — Progress Notes (Signed)
Patient's Name: [name] Haas MRN: [id number] DOB: 12/20/1965 DOS: 01/03/2015  Primary Reason(s) for Visit: Encounter for Medication Management CC: Foot Pain   HPI:    [name] is a 49 y.o. year old, female patient, who returns today as an established patient. She has Essential hypertension; Affective bipolar disorder (HCC); Chronic pain; Acid reflux; BP (high blood pressure); HLD (hyperlipidemia); Incomplete bladder emptying; Headache, migraine; Extreme obesity (HCC); Adiposity; Fracture of bone adjacent to prosthesis (HCC); Dementia praecox (HCC); Compulsive tobacco user syndrome; Paranoid schizophrenia (HCC); Arm fracture; Depression, major, recurrent, moderate (HCC); Aggrieved; Major depressive disorder, recurrent episode, severe, with psychotic behavior (HCC); Injury of kidney; Absolute anemia; Bipolar affective disorder (HCC); Encounter for surgical follow-up care; Morbid obesity (HCC); Fracture of shaft of tibia and fibula, open (Left) (Type III); Type 2 diabetes mellitus with hyperglycemia (HCC); Urge incontinence of urine; Long term current use of opiate analgesic; Long term prescription opiate use; Opiate use; Opiate dependence (HCC); Encounter for therapeutic drug level monitoring; Myofascial pain; Neurogenic pain; Foot drop (Right) (Peroneal/Fibular Neuropathy); Chronic lower extremity pain (Right); Chronic Shoulder Pain (Left); Arthropathy of Shoulder (Left); Chronic low back pain; History of renal failure and respiratory acidosis (05/24/2014); Chronic pain syndrome; Acute encephalopathy; ARF (acute renal failure) (HCC); UTI (lower urinary tract infection); Undifferentiated schizophrenia (HCC); Abnormal lumbar MRI (12/20/2014); Schizophrenia (HCC); 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 Her primarily concern today is the Foot Pain     The patient returns to the clinic today  for follow-up evaluation and clinical logical management of her chronic pain. The results of the patient's EMG would suggest that she has a bilateral common peroneal neuropathy. Because it spares the tibial component, it is not likely to come from the sciatic nerve nor a lumbar injury. Furthermore, the lumbar paravertebral muscle EMG was also within normal limits, once again ruling out the possibility of a radiculopathy.  Today's Pain Score: 5 , clinically she looks like a 1-2/10. Reported level of pain is incompatible with clinical obrservations. This may be secondary to a possible lack of understanding on how the pain scale works. Pain Type: Chronic pain Pain Location: Foot Pain Orientation: Right, Left (left foot broken in 3 places- right foot drop foot) Pain Descriptors / Indicators: Burning, Shooting, Throbbing Pain Frequency: Constant  Date of Last Visit: 11/19/14 Service Provided on Last Visit: Med Refill (MRI ordered)  Pharmacotherapy Review:   Side-effects or Adverse reactions: None reported Effectiveness: Described as relatively effective, allowing for increase in activities of daily living (ADL) Onset of action: Within expected pharmacological parameters Duration of action: Within normal limits for medication Peak effect: Timing and results are as within normal expected parameters  PMP: Compliant with practice rules and regulations UDS Results: UDS done on 12/13/2014 was within normal limits and compliant with therapy. The patient remains compliant. UDS Interpretation: Patient appears to be compliant with practice rules and regulations Medication Assessment Form: Reviewed. Patient indicates being compliant with therapy Treatment compliance: Compliant Substance Use Disorder (SUD) Risk Level: Low Pharmacologic Plan: Continue therapy as is  Lab Work: Inflammation Markers Lab Results  Component Value Date   ESRSEDRATE 58* 11/28/2013    Renal Function Lab Results   Component Value Date   BUN 7 12/23/2014   CREATININE 0.77 12/23/2014   GFRAA >60 12/23/2014   GFRNONAA >60 12/23/2014    Hepatic Function Lab Results  Component Value Date   AST 24 12/15/2014   ALT  15 12/15/2014   ALBUMIN 2.4* 12/15/2014    Electrolytes Lab Results  Component Value Date   NA 139 12/23/2014   K 3.6 12/23/2014   CL 107 12/23/2014   CALCIUM 8.3* 12/23/2014   MG 1.6* 12/23/2014    Illicit Drugs Lab Results  Component Value Date   THCU NONE DETECTED 12/13/2014   PCPSCRNUR NONE DETECTED 12/13/2014   MDMA NONE DETECTED 12/13/2014   AMPHETMU NONE DETECTED 12/13/2014   METHADONE NONE DETECTED 12/13/2014   ETOH < 3 09/04/2011    Allergies:  Ms. Trzcinski is allergic to alcohol; bextra; celebrex; penicillins; sulfa antibiotics; sulfacetamide sodium; vioxx; and metformin.  Meds:  The patient has a current medication list which includes the following prescription(s): brisdelle, calcium-vitamin d, clindamycin, dicyclomine, escitalopram, escitalopram, ferrous sulfate, flurazepam, haloperidol, lisinopril, loperamide, morphine, myrbetriq, nitrofurantoin (macrocrystal-monohydrate), pantoprazole, quetiapine, tizanidine, vitamin d (ergocalciferol), morphine, morphine, oxycodone hcl, oxycodone hcl, oxycodone hcl, and venlafaxine. Requested Prescriptions   Signed Prescriptions Disp Refills  . morphine (MS CONTIN) 15 MG 12 hr tablet 60 tablet 0    Sig: Take 1 tablet (15 mg total) by mouth every 12 (twelve) hours.  Marland Kitchen tiZANidine (ZANAFLEX) 4 MG tablet 90 tablet 2    Sig: Take 1 tablet (4 mg total) by mouth every 8 (eight) hours as needed for muscle spasms.  . Oxycodone HCl 10 MG TABS 90 tablet 0    Sig: Take 1 tablet (10 mg total) by mouth every 8 (eight) hours as needed.  Marland Kitchen morphine (MS CONTIN) 15 MG 12 hr tablet 60 tablet 0    Sig: Take 1 tablet (15 mg total) by mouth every 12 (twelve) hours.  Marland Kitchen morphine (MS CONTIN) 15 MG 12 hr tablet 60 tablet 0    Sig: Take 1 tablet  (15 mg total) by mouth every 12 (twelve) hours.  . Oxycodone HCl 10 MG TABS 90 tablet 0    Sig: Take 1 tablet (10 mg total) by mouth every 8 (eight) hours as needed.  . Oxycodone HCl 10 MG TABS 90 tablet 0    Sig: Take 1 tablet (10 mg total) by mouth every 8 (eight) hours as needed.    ROS:  Constitutional: Afebrile, no chills, well hydrated and well nourished Gastrointestinal: negative Musculoskeletal:negative Neurological: negative Behavioral/Psych: negative  PFSH:  Medical:  Ms. Blecha  has a past medical history of Other chronic pain; Restless legs syndrome (RLS); Disorder of bone and cartilage, unspecified; Unspecified essential hypertension; Esophageal reflux; Personal history of malignant neoplasm of cervix uteri; Irritable bowel syndrome; Contact dermatitis and other eczema, due to unspecified cause; Lumbago; Migraine, unspecified, without mention of intractable migraine without mention of status migrainosus; Unspecified schizophrenia, unspecified condition; Personal history of colonic polyps; Symptomatic menopausal or female climacteric states; Other and unspecified hyperlipidemia; Pain in joint, site unspecified; Hypertonicity of bladder; Pain in joint, forearm; and History of renal failure and respiratory acidosis (05/24/2014) (11/20/2014). Family: family history includes Anemia in her mother; Breast cancer in her paternal grandmother; Diabetes type II in her paternal grandfather; Drug abuse in her brother; Melanoma in her father; Other in her father and mother; Skin cancer in her father. Surgical:  has past surgical history that includes Breast lumpectomy; Hand surgery (1988); Vaginal hysterectomy (2005); Bilateral oophorectomy (2005); Cervical cone biopsy (2005); Shoulder arthroscopy (2007); and Retinal detachment surgery (2010). Tobacco:  reports that she quit smoking about 2 years ago. She has never used smokeless tobacco. Alcohol:  reports that she does not drink alcohol. Drug:   reports that she  does not use illicit drugs.  Physical Exam:  Vitals:  Today's Vitals   01/03/15 1417 01/03/15 1418  BP: 107/77   Pulse: 95   Temp: 98.4 F (36.9 C)   TempSrc: Oral   Resp: 18   Height: 5\' 5"  (1.651 m)   Weight: 212 lb (96.163 kg)   SpO2: 98%   PainSc:  5   Calculated BMI: Body mass index is 35.28 kg/(m^2). General appearance: alert, cooperative, appears stated age, mild distress and morbidly obese Eyes: PERLA Respiratory: No evidence respiratory distress, no audible rales or ronchi and no use of accessory muscles of respiration Neck: no adenopathy, no carotid bruit, no JVD, supple, symmetrical, trachea midline and thyroid not enlarged, symmetric, no tenderness/mass/nodules  Cervical Spine ROM: Adequate for flexion, extension, rotation, and lateral bending Palpation: No palpable trigger points  Upper Extremities ROM: Adequate bilaterally Strength: 5/5 for all flexors and extensors of the upper extremity, bilaterally Pulses: Palpable bilaterally Neurologic: No allodynia, No hyperesthesia, No hyperpathia and No sensory abnormalities  Lumbar Spine ROM: Limited range of motion as she is in a wheelchair and continues with a cast on her left leg. Flexion seems to be within normal limits and asymptomatic. Palpation: No palpable trigger points Lumbar Hyperextension and rotation: Difficult to evaluate due to the patient being wheelchair bound. Patrick's Maneuver: Same as above.  Lower Extremities ROM: Limited ability to test range of motion due to the patient being wheelchair bound. Strength: Unable to evaluate left lower extremity due to cast. Right lower extremity with clear evidence of a foot drop. Decrease sensory over the distribution of the peroneal nerve. Pulses: Unable to check on left leg due to cast. Palpable on right lower extremity posterior tibialis and dorsalis pedes. Neurologic: Hypoesthesia and decreased sensory over the distribution of the peroneal  nerve.  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    Enrique SackKendra was seen today for foot pain and foot pain.  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 -     tiZANidine (ZANAFLEX) 4 MG tablet; Take 1 tablet (4 mg total) by mouth every 8 (eight) hours as needed for muscle spasms. -     Oxycodone HCl 10 MG TABS; Take 1 tablet (10 mg total) by mouth every 8 (eight) hours as needed. -     morphine (MS CONTIN) 15 MG 12 hr tablet; Take 1 tablet (15 mg total) by mouth every 12 (twelve) hours. -     morphine (MS CONTIN) 15 MG 12 hr tablet; Take 1 tablet (15 mg total) by mouth every 12 (twelve) hours. -     Oxycodone HCl 10 MG TABS; Take 1 tablet (10 mg total) by mouth every 8 (eight) hours as needed. -     Oxycodone HCl 10 MG TABS; Take 1 tablet (10 mg total) by mouth every 8 (eight) hours as needed.  Foot drop, right  Encounter for therapeutic drug level monitoring  Long term current use of opiate analgesic -     Drugs of abuse screen w/o alc, rtn urine-sln  Chronic lower extremity pain (Right)  Fracture of shaft of tibia and fibula, open, left, sequela  Abnormal electromyogram (EMG)  Myopathy, distal [facility])     Patient Instructions  Pain Management Discharge Instructions  General Discharge Instructions :  If you need to reach your doctor call: Monday-Friday 8:00 am - 4:00 pm at [phone] or toll free [phone].  After clinic hours [phone] to have operator reach doctor.  Bring all of your medication bottles to all your appointments in the pain clinic.  To cancel or reschedule your appointment with Pain Management please  remember to call 24 hours in advance to avoid a fee.  Refer to the educational materials which you have been given on: General Risks, I had my Procedure. Discharge Instructions, Post Sedation.  Post Procedure Instructions:  The drugs you were given will stay in your system until tomorrow, so for the next 24 hours you should not drive, make any legal decisions or drink any alcoholic beverages.  You may eat anything you prefer, but it is better to start with liquids then soups and crackers, and gradually work up to solid foods.  Please notify your doctor immediately if you have any unusual bleeding, trouble breathing or pain that is not related to your normal pain.  Depending on the type of procedure that was done, some parts of your body may feel week and/or numb.  This usually clears up by tonight or the next day.  Walk with the use of an assistive device or accompanied by an adult for the 24 hours.  You may use ice on the affected area for the first 24 hours.  Put ice in a Ziploc bag and cover with a towel and place against area 15 minutes on 15 minutes off.  You may switch to heat after 24 hours.   Medications discontinued today:  Medications Discontinued During This Encounter  Medication Reason  . budesonide-formoterol (SYMBICORT) 160-4.5 MCG/ACT inhaler Error  . diclofenac (VOLTAREN) 75 MG EC tablet Error  . HYDROcodone-acetaminophen (NORCO/VICODIN) 5-325 MG tablet Error  . hydrOXYzine (VISTARIL) 50 MG capsule Error  . LORazepam (ATIVAN) 0.5 MG tablet Error  . LORazepam (ATIVAN) 0.5 MG tablet Error  . metaxalone (SKELAXIN) 800 MG tablet Error  . PROAIR HFA 108 (90 Base) MCG/ACT inhaler Error  . risperiDONE (RISPERDAL M-TABS) 1 MG disintegrating tablet Error  . morphine (MS CONTIN) 15 MG 12 hr tablet Reorder  . tiZANidine (ZANAFLEX) 4 MG tablet Reorder  . Oxycodone HCl 10 MG TABS Reorder  . acetaminophen (TYLENOL) 325 MG tablet Error   Medications administered today:  Ms.  Laconte had no medications administered during this visit.  Primary Care Physician: Reesa Chew, MD Location: [facility] Note by: Sydnee Levans Laban Emperor, M.D, DABA, DABAPM, DABPM, DABIPP, FIPP

## 2015-01-04 MED ORDER — QUETIAPINE FUMARATE 300 MG PO TABS
600.0000 mg | ORAL_TABLET | Freq: Every day | ORAL | Status: DC
Start: 2015-01-04 — End: 2015-04-17

## 2015-01-08 ENCOUNTER — Telehealth: Payer: Self-pay | Admitting: *Deleted

## 2015-01-08 NOTE — Telephone Encounter (Signed)
Patient called and stated that she did not get all her Rx's at her last visit.  After talking with patient and explaining that more than one Rx can be on a sheet of paper, she realized that all Rx's are there. Patient also ask if she thought Dr Laban EmperorNaveira would allow her to take her Oxycodone every 6 hours instead of every 8.  Instructed patient that she should not adjust medication until she is able to see DR Laban EmperorNaveira again and that if we need to schedule an earlier appointment we could, that he would not adjust pain medication over the phone.

## 2015-01-09 DIAGNOSIS — N63 Unspecified lump in unspecified breast: Secondary | ICD-10-CM | POA: Insufficient documentation

## 2015-01-10 NOTE — Discharge Summary (Signed)
[facility] [place] at [facility]  DISCHARGE SUMMARY   PATIENT NAME: [name] FossaKendra Haas    MR#:  [id number]  DATE OF BIRTH:  1965-03-15  DATE OF ADMISSION:  12/13/2014 ADMITTING PHYSICIAN: Auburn BilberryShreyang Patel, MD  DATE OF DISCHARGE: 12/23/2014  PRIMARY CARE PHYSICIAN: Reesa ChewGRIFFITH, [name], MD    ADMISSION DIAGNOSIS:  Hyperkalemia [E87.5] Acidemia [E87.2] Renal failure [N19] ARF (acute renal failure) (HCC) [N17.9] UTI (lower urinary tract infection) [N39.0] Hypoxia [R09.02] Altered mental status, unspecified altered mental status type [R41.82]  DISCHARGE DIAGNOSIS:  Active Problems:   Acute encephalopathy   ARF (acute renal failure) (HCC)   UTI (lower urinary tract infection)   Undifferentiated schizophrenia (HCC)   SECONDARY DIAGNOSIS:   Past Medical History  Diagnosis Date  . Other chronic pain   . Restless legs syndrome (RLS)   . Disorder of bone and cartilage, unspecified   . Unspecified essential hypertension   . Esophageal reflux   . Personal history of malignant neoplasm of cervix uteri   . Irritable bowel syndrome   . Contact dermatitis and other eczema, due to unspecified cause   . Lumbago   . Migraine, unspecified, without mention of intractable migraine without mention of status migrainosus   . Unspecified schizophrenia, unspecified condition   . Personal history of colonic polyps   . Symptomatic menopausal or female climacteric states   . Other and unspecified hyperlipidemia   . Pain in joint, site unspecified   . Hypertonicity of bladder   . Pain in joint, forearm   . History of renal failure and respiratory acidosis (05/24/2014) 11/20/2014    HOSPITAL COURSE:    1. Acute encephalopathy: Resolved. Likely due combination of acute illness and underlying psychological disorders.  2. Urinary tract infection: Culture showing VRE. Continue Macrobid as recommended by infectious disease team.  3. Acute renal failure with severe  hyperkalemia; resolved   4. Bipolar disorder: She was followed by psychiatry during the hospitalization.  We have restarted her medications. She is currently alert, oriented, in good spirits  5. IBS: Controlled. Continue Bentyl.  6. history of gastritis and duodenitis: continue PPI  #7 right foot drop: MRI of the lumbosacral spine showed foraminal stenosis. Continue to work with pain management.   DISCHARGE CONDITIONS:   stable  CONSULTS OBTAINED:  Treatment Team:  Auburn BilberryShreyang Patel, MD Clydie Braunavid Fitzgerald, MD Audery AmelJohn T Clapacs, MD  DRUG ALLERGIES:   Allergies  Allergen Reactions  . Alcohol Swelling    overall swelling  . Bextra [Valdecoxib]   . Celebrex [Celecoxib]   . Penicillins     Angioedema & rash  . Sulfa Antibiotics   . Sulfacetamide Sodium   . Vioxx [Rofecoxib]   . Metformin Rash    Sores, ulcers    DISCHARGE MEDICATIONS:   Discharge Medication List as of 12/23/2014  1:44 PM    START taking these medications   Details  !! escitalopram (LEXAPRO) 10 MG tablet Take 1 tablet (10 mg total) by mouth daily., Starting 12/22/2014, Until Discontinued, Print    nitrofurantoin, macrocrystal-monohydrate, (MACROBID) 100 MG capsule Take 1 capsule (100 mg total) by mouth every 12 (twelve) hours., Starting 12/22/2014, Until Thu 01/03/15, Print    risperiDONE (RISPERDAL M-TABS) 1 MG disintegrating tablet Take 1 tablet (1 mg total) by mouth at bedtime., Starting 12/22/2014, Until Discontinued, Print     !! - Potential duplicate medications found. Please discuss with provider.    CONTINUE these medications which have NOT CHANGED   Details  BRISDELLE 7.5 MG  CAPS Take 1 capsule by mouth at bedtime., Starting 09/17/2014, Until Discontinued, Historical Med    dicyclomine (BENTYL) 20 MG tablet Take 20 mg by mouth daily. , Starting 04/12/2014, Until Discontinued, Historical Med    !! escitalopram (LEXAPRO) 10 MG tablet Take 1 tablet by mouth daily., Starting 12/08/2014, Until  Discontinued, Historical Med    MYRBETRIQ 50 MG TB24 tablet 50 mg daily. , Starting 06/01/2014, Until Discontinued, Historical Med    diclofenac (VOLTAREN) 75 MG EC tablet Take 1 tablet by mouth 2 (two) times daily., Starting 12/07/2014, Until Discontinued, Historical Med    morphine (MS CONTIN) 15 MG 12 hr tablet Take 1 tablet (15 mg total) by mouth every 12 (twelve) hours., Starting 11/19/2014, Until Discontinued, Print    Oxycodone HCl 10 MG TABS Take 1 tablet (10 mg total) by mouth every 8 (eight) hours as needed., Starting 11/19/2014, Until Discontinued, Print    tiZANidine (ZANAFLEX) 4 MG tablet Take 1 tablet (4 mg total) by mouth every 8 (eight) hours as needed., Starting 11/19/2014, Until Discontinued, Normal    QUEtiapine (SEROQUEL) 300 MG tablet Take 2 tablets (600 mg total) by mouth at bedtime., Starting 06/22/2014, Until Discontinued, Normal     !! - Potential duplicate medications found. Please discuss with provider.       DISCHARGE INSTRUCTIONS:   Discharge to home.  Continue prior home health care. Regular diet.   If you experience worsening of your admission symptoms, develop shortness of breath, life threatening emergency, suicidal or homicidal thoughts you must seek medical attention immediately by calling 911 or calling your MD immediately  if symptoms less severe.  You Must read complete instructions/literature along with all the possible adverse reactions/side effects for all the Medicines you take and that have been prescribed to you. Take any new Medicines after you have completely understood and accept all the possible adverse reactions/side effects.   Please note  You were cared for by a hospitalist during your hospital stay. If you have any questions about your discharge medications or the care you received while you were in the hospital after you are discharged, you can call the unit and asked to speak with the hospitalist on call if the hospitalist that took care  of you is not available. Once you are discharged, your primary care physician will handle any further medical issues. Please note that NO REFILLS for any discharge medications will be authorized once you are discharged, as it is imperative that you return to your primary care physician (or establish a relationship with a primary care physician if you do not have one) for your aftercare needs so that they can reassess your need for medications and monitor your lab values.    Today   CHIEF COMPLAINT:   Chief Complaint  Patient presents with  . Altered Mental Status    HISTORY OF PRESENT ILLNESS:  [name] Haas is a 50 y.o. female with a known history of bipolar disorder, chronic pain, restless leg syndrome, irritable bowel syndrome who according to her father was acting normally yesterday morning. Last night she had some nausea but was awake but when this morning he noticed she was very confused and was not herself. Patient brought to the emergency room noted to have a creatinine this very elevated, she has a creatinine level of 7.53 her recent creatinine on 12/06/2013 was 1.16. She also was noted to have urinary tract infection. Currently she is confused and unable to provide me with any review of systems I  discussed with the father and he was able to provide me with the above history.  VITAL SIGNS:  Blood pressure 121/57, pulse 89, temperature 98.2 F (36.8 C), temperature source Oral, resp. rate 17, height 5\' 6"  (1.676 m), weight 103.1 kg (227 lb 4.7 oz), SpO2 99 %.  I/O:  No intake or output data in the 24 hours ending 01/10/15 0705  PHYSICAL EXAMINATION:  GENERAL:  50 y.o.-year-old patient lying in the bed with no acute distress.  LUNGS: Normal breath sounds bilaterally, no wheezing, rales,rhonchi or crepitation. No use of accessory muscles of respiration.  CARDIOVASCULAR: S1, S2 normal. No murmurs, rubs, or gallops.  ABDOMEN: Soft, non-tender, non-distended. Bowel sounds present. No  organomegaly or mass.  EXTREMITIES: No pedal edema, cyanosis, or clubbing.  NEUROLOGIC: Cranial nerves II through XII are intact. Muscle strength 5/5 in all extremities. Sensation intact. Gait not checked.  PSYCHIATRIC: The patient is alert and oriented x 3.  SKIN: No obvious rash, lesion, or ulcer.   DATA REVIEW:   CBC No results for input(s): WBC, HGB, HCT, PLT in the last 168 hours.  Chemistries  No results for input(s): NA, K, CL, CO2, GLUCOSE, BUN, CREATININE, CALCIUM, MG, AST, ALT, ALKPHOS, BILITOT in the last 168 hours.  Invalid input(s): GFRCGP  Cardiac Enzymes No results for input(s): TROPONINI in the last 168 hours.  Microbiology Results  Results for orders placed or performed during the hospital encounter of 12/13/14  Urine culture     Status: None   Collection Time: 12/13/14  8:30 AM  Result Value Ref Range Status   Specimen Description URINE, CLEAN CATCH  Final   Special Requests NONE  Final   Culture   Final    >=100,000 COLONIES/mL ENTEROCOCCUS FAECALIS VANCOMYCIN RESISTANT ENTEROCOCCUS CRITICAL RESULT CALLED TO, READ BACK BY AND VERIFIED WITH: Adelina Mings AT 1610 ON 12/19/14 CTJ    Report Status 12/19/2014 FINAL  Final   Organism ID, Bacteria ENTEROCOCCUS FAECALIS  Final      Susceptibility   Enterococcus faecalis - MIC*    AMPICILLIN <=2 SENSITIVE Sensitive     NITROFURANTOIN <=16 SENSITIVE Sensitive     LINEZOLID 2 SENSITIVE Sensitive     CIPROFLOXACIN Value in next row Resistant      RESISTANT>=8    LEVOFLOXACIN Value in next row Resistant      RESISTANT>=8    TETRACYCLINE Value in next row Resistant      RESISTANT>=16    VANCOMYCIN Value in next row Resistant      RESISTANT>=16    GENTAMICIN SYNERGY Value in next row Resistant      RESISTANT>=16    * >=100,000 COLONIES/mL ENTEROCOCCUS FAECALIS  MRSA PCR Screening     Status: Abnormal   Collection Time: 12/13/14  3:00 PM  Result Value Ref Range Status   MRSA by PCR POSITIVE (A) NEGATIVE Final     Comment:        The GeneXpert MRSA Assay (FDA approved for NASAL specimens only), is one component of a comprehensive MRSA colonization surveillance program. It is not intended to diagnose MRSA infection nor to guide or monitor treatment for MRSA infections. CRITICAL RESULT CALLED TO, READ BACK BY AND VERIFIED WITH:  DALE HOPKINS AT 2117 12/13/14 SDR   Culture, blood (routine x 2)     Status: None   Collection Time: 12/13/14  5:25 PM  Result Value Ref Range Status   Specimen Description BLOOD LEFT WRIST  Final   Special Requests   Final  BOTTLES DRAWN AEROBIC AND ANAEROBIC  1CC AERO, 2CC ANAERO   Culture NO GROWTH 5 DAYS  Final   Report Status 12/18/2014 FINAL  Final  Culture, blood (routine x 2)     Status: None   Collection Time: 12/13/14  6:39 PM  Result Value Ref Range Status   Specimen Description BLOOD RIGHT ARM  Final   Special Requests BOTTLES DRAWN AEROBIC AND ANAEROBIC 7CC  Final   Culture NO GROWTH 5 DAYS  Final   Report Status 12/18/2014 FINAL  Final    RADIOLOGY:  No results found.  EKG:   Orders placed or performed during the hospital encounter of 12/13/14  . ED EKG  . ED EKG  . EKG 12-Lead  . EKG 12-Lead      Management plans discussed with the patient, family and they are in agreement.  CODE STATUS: full  TOTAL TIME TAKING CARE OF THIS PATIENT: 35 minutes.  Greater than 50% of time spent in care coordination and counseling.  Elby Showers M.D on 01/10/2015 at 7:05 AM  Between 7am to 6pm - Pager - [phone]  After 6pm go to [email or web address] - password [email or web address] [facility]  [place] [facility]  Office  [phone]  CC: Primary care physician; Reesa Chew, MD

## 2015-01-11 ENCOUNTER — Other Ambulatory Visit: Payer: Self-pay | Admitting: Psychiatry

## 2015-01-11 ENCOUNTER — Other Ambulatory Visit: Payer: Self-pay

## 2015-01-11 LAB — TOXASSURE SELECT 13 (MW), URINE: PDF: 0

## 2015-01-11 NOTE — Telephone Encounter (Signed)
According to the notes I have she is not supposed to be taking Haldol

## 2015-01-11 NOTE — Telephone Encounter (Signed)
According to my note she is not supposed to be taking Haldol

## 2015-01-11 NOTE — Telephone Encounter (Signed)
pt called states that she needs a refill on her Haldol 2mg .  pt was last seen on  03-30-14 next appt  01-14-14.    Question how patient should take medication.  Per the patient she take 2mg  1 tablet twice daily.  Set up rx to last until patient comes in on 01-14-14. Since patient has not been seen since 03-30-14

## 2015-01-14 ENCOUNTER — Other Ambulatory Visit: Payer: Self-pay | Admitting: Psychiatry

## 2015-01-15 ENCOUNTER — Ambulatory Visit (INDEPENDENT_AMBULATORY_CARE_PROVIDER_SITE_OTHER): Payer: 59 | Admitting: Psychiatry

## 2015-01-15 ENCOUNTER — Encounter: Payer: Self-pay | Admitting: Psychiatry

## 2015-01-15 VITALS — BP 124/80 | HR 122 | Temp 99.5°F | Ht 65.0 in | Wt 226.6 lb

## 2015-01-15 DIAGNOSIS — F209 Schizophrenia, unspecified: Secondary | ICD-10-CM

## 2015-01-15 MED ORDER — FLURAZEPAM HCL 15 MG PO CAPS: 15.0000 mg | ORAL_CAPSULE | Freq: Every evening | ORAL | Status: AC | PRN

## 2015-01-18 NOTE — Telephone Encounter (Signed)
I discussed this with the patient at our appointment. She is not supposed to be taking Haldol.s He is not to take this medicine.

## 2015-02-05 ENCOUNTER — Other Ambulatory Visit: Payer: Self-pay | Admitting: Pain Medicine

## 2015-02-06 NOTE — Telephone Encounter (Signed)

## 2015-02-11 ENCOUNTER — Other Ambulatory Visit: Payer: Self-pay

## 2015-02-16 ENCOUNTER — Other Ambulatory Visit: Payer: Self-pay | Admitting: Psychiatry

## 2015-02-25 ENCOUNTER — Other Ambulatory Visit: Payer: Self-pay | Admitting: Psychiatry

## 2015-03-07 NOTE — Progress Notes (Signed)
[facility] MD Progress Note  03/07/2015 5:02 PM [name] Haas  MRN:  [id number] Subjective:  Follow-up for this 50 year old woman with a history of schizophrenia. She has had some hospital problems recently but has persevered through it. Last time I spoke to her she was doing well with good control of her psychotic symptoms. There seems to be a little bit of return now with some return of the paranoid and odd thinking but not severely so and she maintains good insight. Denies suicidal ideation. Tolerating current medication well. Getting by without high doses of benzodiazepines. Principal Problem: @ Diagnosis:   Patient Active Problem List   Diagnosis Date Noted  . Breast lump [N63] 01/09/2015  . Abnormal lumbar MRI (12/20/2014) [R93.7] 01/03/2015  . 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 Abnormal electromyogram (EMG) [R94.131] 01/03/2015  . 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 Undifferentiated schizophrenia (HCC) [F20.3]   . Acute encephalopathy [G93.40] 12/13/2014  . ARF (acute renal failure) (HCC) [N17.9]   . UTI (lower urinary tract infection) [N39.0]   . Chronic Shoulder Pain (Left) [M25.512, G89.29] 11/20/2014  . Arthropathy of Shoulder (Left) [M12.819] 11/20/2014  . Chronic low back pain [M54.5, G89.29] 11/20/2014  . History of renal failure and respiratory acidosis (05/24/2014) [Z87.448] 11/20/2014  . Chronic pain syndrome [G89.4] 11/20/2014  . Long term current use of opiate analgesic [Z79.891] 11/19/2014  . Long term prescription opiate use [Z79.899] 11/19/2014  . Opiate use [F11.90] 11/19/2014  . Opiate dependence (HCC) [F11.20] 11/19/2014  . Encounter for therapeutic drug level monitoring [Z51.81] 11/19/2014  . Myofascial pain [M79.1] 11/19/2014  . Neurogenic pain [M79.2] 11/19/2014  . Foot drop (Right) (Peroneal/Fibular Neuropathy) [M21.371] 11/19/2014  . Chronic lower extremity pain (Right) [W09.811, G89.29]  11/19/2014  . Encounter for surgical follow-up care [Z48.89] 11/08/2014  . Absolute anemia [D64.9] 07/06/2014  . Injury of kidney [S37.009A] 07/05/2014  . Fracture of shaft of tibia and fibula, open (Left) (Type III) [S82.90XB] 07/05/2014    Class: History of  . Paranoid schizophrenia (HCC) [F20.0] 06/14/2014  . Arm fracture [S42.309A] 06/14/2014  . Depression, major, recurrent, moderate (HCC) [F33.1] 06/14/2014  . Aggrieved [F43.21] 06/14/2014  . 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 Essential hypertension [I10] 06/07/2014  . HLD (hyperlipidemia) [E78.5] 06/07/2014  . Fracture of bone adjacent to prosthesis (HCC) [B14.9XXA] 02/13/2014  . Extreme obesity (HCC) [E66.01] 01/23/2014  . Morbid obesity (HCC) [E66.01] 01/23/2014  . Type 2 diabetes mellitus with hyperglycemia (HCC) [E11.65] 01/23/2014  . Chronic pain [G89.29] 11/20/2013  . BP (high blood pressure) [I10] 11/20/2013  . Headache, migraine [G43.909] 11/20/2013  . 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 Affective bipolar disorder (HCC) [F31.9] 03/15/2012  . Acid reflux [K21.9] 03/15/2012  . Adiposity [E66.9] 03/15/2012  . Compulsive tobacco user syndrome [F17.200] 03/15/2012  . Bipolar affective disorder (HCC) [F31.9] 03/15/2012  . Schizophrenia (HCC) [F20.9] 03/15/2012  . Incomplete bladder emptying [R33.9] 09/22/2011  . Urge incontinence of urine [N39.41] 08/25/2011   Total Time spent with patient: 20 minutes  Past Psychiatric History: Past history of schizophrenia and previous psychiatric hospitalization  Past Medical History:  Past Medical History  Diagnosis Date  . Other chronic pain   . Restless legs syndrome (RLS)   . Disorder of bone and cartilage, unspecified   . Unspecified essential hypertension   . Esophageal reflux   . Personal history of malignant neoplasm of cervix uteri   . Irritable bowel  syndrome   . Contact dermatitis and other  eczema, due to unspecified cause   . Lumbago   . Migraine, unspecified, without mention of intractable migraine without mention of status migrainosus   . Unspecified schizophrenia, unspecified condition   . Personal history of colonic polyps   . Symptomatic menopausal or female climacteric states   . Other and unspecified hyperlipidemia   . Pain in joint, site unspecified   . Hypertonicity of bladder   . Pain in joint, forearm   . History of renal failure and respiratory acidosis (05/24/2014) 11/20/2014    Past Surgical History  Procedure Laterality Date  . Breast lumpectomy      left multiple benign  . Hand surgery  1988    left tendon transfer  . Vaginal hysterectomy  2005  . Bilateral oophorectomy  2005  . Cervical cone biopsy  2005  . Shoulder arthroscopy  2007    right  . Retinal detachment surgery  2010    right, repair  . Foot surgery Left    Family History:  Family History  Problem Relation Age of Onset  . Skin cancer Father     carcinoma, basal cell  . Melanoma Father   . Other Father     hay fever  . Anemia Mother   . Other Mother     epilepsy  . Drug abuse Brother   . Breast cancer Paternal Grandmother   . Diabetes type II Paternal Grandfather    Family Psychiatric  History: Family history of mood disorders Social History:  History  Alcohol Use No     History  Drug Use No    Social History   Social History  . Marital Status: Single    Spouse Name: N/A  . Number of Children: N/A  . Years of Education: N/A   Social History Main Topics  . Smoking status: Former Smoker    Quit date: 06/06/2012  . Smokeless tobacco: Never Used  . Alcohol Use: No  . Drug Use: No  . Sexual Activity: No   Other Topics Concern  . None   Social History Narrative   Additional Social History:                         Sleep: Good  Appetite:  Good  Current Medications: Current Outpatient Prescriptions  Medication Sig Dispense Refill  . BRISDELLE  7.5 MG CAPS Take 1 capsule by mouth at bedtime.  3  . Calcium Carbonate-Vitamin D (CALCIUM-VITAMIN D) 500-200 MG-UNIT tablet Take by mouth.    . diclofenac (VOLTAREN) 75 MG EC tablet TK 1 T PO BID  2  . diclofenac sodium (VOLTAREN) 1 % GEL Apply topically.    . dicyclomine (BENTYL) 20 MG tablet Take 20 mg by mouth daily.   0  . escitalopram (LEXAPRO) 10 MG tablet Take 1 tablet by mouth daily.  3  . flurazepam (DALMANE) 15 MG capsule Take 1 capsule (15 mg total) by mouth at bedtime as needed for sleep. 30 capsule 2  . lisinopril (PRINIVIL,ZESTRIL) 10 MG tablet TAKE 1 TABLET BY MOUTH ONCE DAILY.    Marland Kitchen morphine (MS CONTIN) 15 MG 12 hr tablet Take 1 tablet (15 mg total) by mouth every 12 (twelve) hours. 60 tablet 0  . MYRBETRIQ 50 MG TB24 tablet 50 mg daily.   11  . Oxycodone HCl 10 MG TABS Take 1 tablet (10 mg total) by mouth every 8 (eight) hours as needed. 90  tablet 0  . pantoprazole (PROTONIX) 40 MG tablet TAKE 1 TABLET BY MOUTH ONCE DAILY    . QUEtiapine (SEROQUEL) 300 MG tablet Take 2 tablets (600 mg total) by mouth at bedtime. 60 tablet 2  . tiZANidine (ZANAFLEX) 4 MG tablet Take 1 tablet (4 mg total) by mouth every 8 (eight) hours as needed for muscle spasms. 90 tablet 2  . Vitamin D, Ergocalciferol, (DRISDOL) 50000 units CAPS capsule     . ferrous sulfate 325 (65 FE) MG tablet Take by mouth.     No current facility-administered medications for this visit.    Lab Results: No results found for this or any previous visit (from the past 48 hour(s)).  Blood Alcohol level:  Lab Results  Component Value Date   ETH <5 12/13/2014    Physical Findings: AIMS:  , ,  ,  ,    CIWA:    COWS:     Musculoskeletal: Strength & Muscle Tone: within normal limits Gait & Station: normal Patient leans: N/A  Psychiatric Specialty Exam: ROS  Blood pressure 124/80, pulse 122, temperature 99.5 F (37.5 C), temperature source Tympanic, height 5\' 5"  (1.651 m), weight 102.785 kg (226 lb 9.6 oz), SpO2  88 %.Body mass index is 37.71 kg/(m^2).  General Appearance: Casual  Eye Contact::  Good  Speech:  Clear and Coherent  Volume:  Normal  Mood:  Euthymic  Affect:  Flat  Thought Process:  Goal Directed  Orientation:  Full (Time, Place, and Person)  Thought Content:  Hallucinations: Auditory  Suicidal Thoughts:  No  Homicidal Thoughts:  No  Memory:  Immediate;   Good Recent;   Good Remote;   Good  Judgement:  Fair  Insight:  Fair  Psychomotor Activity:  Normal  Concentration:  Fair  Recall:  [employer] of Knowledge:Fair  Language: Fair  Akathisia:  No  Handed:  Right  AIMS (if indicated):     Assets:  Communication Skills Desire for Improvement Financial Resources/Insurance Housing Resilience  ADL's:  Intact  Cognition: WNL  Sleep:      Treatment Plan Summary: Medication management and Plan Currently doing well on medications mainly Seroquel 600 mg at night. Also continuing low-dose Lexapro. Mood appears to be good. No suicidal ideation. Reassured patient that she is doing relatively well and urge her to continue with her medical treatment. Follow-up in another couple months. No addition of any sedating medicine at this time.  Mordecai Rasmussen, MD 03/07/2015, 5:02 PM

## 2015-03-27 ENCOUNTER — Ambulatory Visit: Payer: Medicare Other | Attending: Pain Medicine | Admitting: Pain Medicine

## 2015-03-27 ENCOUNTER — Encounter: Payer: Self-pay | Admitting: Pain Medicine

## 2015-03-27 VITALS — BP 143/57 | HR 87 | Temp 97.6°F | Resp 16 | Ht 65.0 in | Wt 230.0 lb

## 2015-03-27 DIAGNOSIS — G2581 Restless legs syndrome: Secondary | ICD-10-CM | POA: Diagnosis not present

## 2015-03-27 DIAGNOSIS — M25572 Pain in left ankle and joints of left foot: Secondary | ICD-10-CM | POA: Diagnosis not present

## 2015-03-27 DIAGNOSIS — Z5181 Encounter for therapeutic drug level monitoring: Secondary | ICD-10-CM | POA: Diagnosis not present

## 2015-03-27 DIAGNOSIS — M170 Bilateral primary osteoarthritis of knee: Secondary | ICD-10-CM

## 2015-03-27 DIAGNOSIS — F331 Major depressive disorder, recurrent, moderate: Secondary | ICD-10-CM | POA: Insufficient documentation

## 2015-03-27 DIAGNOSIS — M79671 Pain in right foot: Secondary | ICD-10-CM | POA: Diagnosis not present

## 2015-03-27 DIAGNOSIS — M21371 Foot drop, right foot: Secondary | ICD-10-CM | POA: Diagnosis not present

## 2015-03-27 DIAGNOSIS — M791 Myalgia: Secondary | ICD-10-CM | POA: Insufficient documentation

## 2015-03-27 DIAGNOSIS — M79604 Pain in right leg: Secondary | ICD-10-CM | POA: Insufficient documentation

## 2015-03-27 DIAGNOSIS — G9340 Encephalopathy, unspecified: Secondary | ICD-10-CM | POA: Insufficient documentation

## 2015-03-27 DIAGNOSIS — N63 Unspecified lump in breast: Secondary | ICD-10-CM | POA: Insufficient documentation

## 2015-03-27 DIAGNOSIS — I10 Essential (primary) hypertension: Secondary | ICD-10-CM | POA: Insufficient documentation

## 2015-03-27 DIAGNOSIS — G8929 Other chronic pain: Secondary | ICD-10-CM | POA: Diagnosis not present

## 2015-03-27 DIAGNOSIS — F200 Paranoid schizophrenia: Secondary | ICD-10-CM | POA: Insufficient documentation

## 2015-03-27 DIAGNOSIS — K589 Irritable bowel syndrome without diarrhea: Secondary | ICD-10-CM | POA: Insufficient documentation

## 2015-03-27 DIAGNOSIS — M25562 Pain in left knee: Secondary | ICD-10-CM

## 2015-03-27 DIAGNOSIS — F209 Schizophrenia, unspecified: Secondary | ICD-10-CM | POA: Insufficient documentation

## 2015-03-27 DIAGNOSIS — Z79891 Long term (current) use of opiate analgesic: Secondary | ICD-10-CM | POA: Diagnosis not present

## 2015-03-27 DIAGNOSIS — Z8541 Personal history of malignant neoplasm of cervix uteri: Secondary | ICD-10-CM | POA: Diagnosis not present

## 2015-03-27 DIAGNOSIS — N179 Acute kidney failure, unspecified: Secondary | ICD-10-CM | POA: Diagnosis not present

## 2015-03-27 DIAGNOSIS — R339 Retention of urine, unspecified: Secondary | ICD-10-CM | POA: Diagnosis not present

## 2015-03-27 DIAGNOSIS — M79606 Pain in leg, unspecified: Secondary | ICD-10-CM

## 2015-03-27 DIAGNOSIS — F3189 Other bipolar disorder: Secondary | ICD-10-CM | POA: Insufficient documentation

## 2015-03-27 DIAGNOSIS — F1190 Opioid use, unspecified, uncomplicated: Secondary | ICD-10-CM

## 2015-03-27 DIAGNOSIS — F319 Bipolar disorder, unspecified: Secondary | ICD-10-CM | POA: Diagnosis not present

## 2015-03-27 DIAGNOSIS — M25512 Pain in left shoulder: Secondary | ICD-10-CM | POA: Insufficient documentation

## 2015-03-27 DIAGNOSIS — M25571 Pain in right ankle and joints of right foot: Secondary | ICD-10-CM | POA: Insufficient documentation

## 2015-03-27 DIAGNOSIS — K219 Gastro-esophageal reflux disease without esophagitis: Secondary | ICD-10-CM | POA: Diagnosis not present

## 2015-03-27 DIAGNOSIS — F0390 Unspecified dementia without behavioral disturbance: Secondary | ICD-10-CM | POA: Insufficient documentation

## 2015-03-27 DIAGNOSIS — Z87891 Personal history of nicotine dependence: Secondary | ICD-10-CM | POA: Diagnosis not present

## 2015-03-27 DIAGNOSIS — M129 Arthropathy, unspecified: Secondary | ICD-10-CM | POA: Insufficient documentation

## 2015-03-27 DIAGNOSIS — E785 Hyperlipidemia, unspecified: Secondary | ICD-10-CM | POA: Diagnosis not present

## 2015-03-27 DIAGNOSIS — M25519 Pain in unspecified shoulder: Secondary | ICD-10-CM | POA: Diagnosis present

## 2015-03-27 DIAGNOSIS — M25579 Pain in unspecified ankle and joints of unspecified foot: Secondary | ICD-10-CM

## 2015-03-27 DIAGNOSIS — E1165 Type 2 diabetes mellitus with hyperglycemia: Secondary | ICD-10-CM | POA: Insufficient documentation

## 2015-03-27 DIAGNOSIS — R51 Headache: Secondary | ICD-10-CM | POA: Diagnosis not present

## 2015-03-27 DIAGNOSIS — M25561 Pain in right knee: Secondary | ICD-10-CM

## 2015-03-27 MED ORDER — MORPHINE SULFATE ER 15 MG PO TBCR: 15.0000 mg | EXTENDED_RELEASE_TABLET | Freq: Two times a day (BID) | ORAL | Status: AC

## 2015-03-27 MED ORDER — OXYCODONE HCL 10 MG PO TABS: 10.0000 mg | ORAL_TABLET | Freq: Three times a day (TID) | ORAL | Status: DC | PRN

## 2015-03-27 MED ORDER — OXYCODONE HCL 10 MG PO TABS: 10.0000 mg | ORAL_TABLET | Freq: Three times a day (TID) | ORAL | Status: AC | PRN

## 2015-03-27 MED ORDER — TIZANIDINE HCL 4 MG PO TABS
4.0000 mg | ORAL_TABLET | Freq: Three times a day (TID) | ORAL | Status: AC | PRN
Start: 2015-03-27 — End: ?

## 2015-03-27 NOTE — Progress Notes (Signed)
Safety precautions to be maintained throughout the outpatient stay will include: orient to surroundings, keep bed in low position, maintain call bell within reach at all times, provide assistance with transfer out of bed and ambulation. Morphine pill count # 31/60  Filled 03-04-15 Oxycodone pill count # 1/90  Filled 03-04-15  Meds are to last until 03-31-15

## 2015-03-27 NOTE — Progress Notes (Signed)
Patient's Name: [name] Haas MRN: [id number] DOB: 05/28/1965 DOS: 03/27/2015  Primary Reason(s) for Visit: Encounter for Medication Management CC: Shoulder Pain; Leg Pain; Foot Pain; and Ankle Pain   HPI  Ms. Cihlar is a 50 y.o. year old, female patient, who returns today as an established patient. She has Essential hypertension; Affective bipolar disorder (HCC); Chronic pain; Acid reflux; BP (high blood pressure); HLD (hyperlipidemia); Incomplete bladder emptying; Headache, migraine; Extreme obesity (HCC); Adiposity; Fracture of bone adjacent to prosthesis (HCC); Dementia praecox (HCC); Compulsive tobacco user syndrome; Paranoid schizophrenia (HCC); Arm fracture; Depression, major, recurrent, moderate (HCC); Aggrieved; Major depressive disorder, recurrent episode, severe, with psychotic behavior (HCC); Injury of kidney; Absolute anemia; Bipolar affective disorder (HCC); Encounter for surgical follow-up care; Morbid obesity (HCC); Fracture of shaft of tibia and fibula, open (Left) (Type III); Type 2 diabetes mellitus with hyperglycemia (HCC); Urge incontinence of urine; Long term current use of opiate analgesic; Long term prescription opiate use; Opiate use (75 MME/Day); Opiate dependence (HCC); Encounter for therapeutic drug level monitoring; Myofascial pain; Neurogenic pain; Foot drop (Right) (Peroneal/Fibular Neuropathy); Chronic Shoulder Pain (Location of Secondary source of pain) (Left); Arthropathy of Shoulder (Left); Chronic low back pain (Bilateral) (R>L); History of renal failure and respiratory acidosis (05/24/2014); Chronic pain syndrome; Acute encephalopathy; ARF (acute renal failure) (HCC); UTI (lower urinary tract infection); Undifferentiated schizophrenia (HCC); Abnormal lumbar MRI (12/20/2014); Schizophrenia (HCC); 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 Her primarily concern today is the Shoulder Pain; Leg Pain; Foot Pain; and Ankle Pain   The patient comes in today clinics today for pharmacological management of her chronic pain. The patient comes in indicating that her primary pain is that of the ankles with the left being worse than the right. This is followed by the left shoulder pain and then the lower extremity pain with the left being worst on the right. In the case of the lower extremities to pain appears to be referred since it runs through the back of the leg all the way down into the calf area but does not getting to her feet. In addition to this she also complains of low back pain that seems to be bilateral with the right side being worst on the left. Finally there is the pain in both knees with the right being worst on the left.  Pain Assessment: Self-Reported Pain Score: 4  Reported level is compatible with observation Pain Type: Chronic pain Pain Location: Ankle (ankle, right foot, left shoulder, feet ) Pain Descriptors / Indicators: Throbbing, Shooting, Aching Pain Frequency: Constant  Date of Last Visit: 01/03/15 Service Provided on Last Visit: Med Refill  Controlled Substance Pharmacotherapy Assessment  Analgesic: MS Contin 15 mg every 12 hours (30 mg/day) + oxycodone IR 10 mg every 8 hours (30 mg/day) Pill Count: Morphine pill count # 31/60 Filled 03-04-15  Oxycodone pill count # 1/90 Filled 03-04-15 Meds are to last until 03-31-15 MME/day: 75 mg/day Pharmacokinetics: Onset of action (Liberation/Absorption): Within expected pharmacological parameters. (45 minutes) Time to Peak effect (Distribution): Timing and results are as within normal expected parameters. (4 hours) Duration of action  (Metabolism/Excretion): Within normal limits for medication. (6 hours) Pharmacodynamics: Analgesic Effect: 75-80% Activity Facilitation: Medication(s) allow patient to sit,  stand, walk, and do the basic ADLs Perceived Effectiveness: Described as relatively effective, allowing for increase in activities of daily living (ADL) Side-effects or Adverse reactions: None reported Monitoring: [place] PMP: Compliant with practice rules and regulations UDS Results/interpretation: Last UDS done on 01/03/2015 came back within normal limits with no unexpected results. Medication Assessment Form: Reviewed. Patient indicates being compliant with therapy Treatment compliance: Compliant Risk Assessment: Aberrant Behavior: None observed today Substance Use Disorder (SUD) Risk Level: Low Opioid Risk Tool (ORT) Score: Total Score: 7 Moderate Risk for SUD (Score between 4-7) Depression Scale Score: PHQ-2: PHQ-2 Total Score: 0 No depression (0) PHQ-9: PHQ-9 Total Score: 0 No depression (0-4)  Pharmacologic Plan: No change in therapy, at this time   Laboratory Workup  Last ED UDS: Lab Results  Component Value Date   THCU NONE DETECTED 12/13/2014   PCPSCRNUR NONE DETECTED 12/13/2014   MDMA NONE DETECTED 12/13/2014   AMPHETMU NONE DETECTED 12/13/2014   METHADONE NONE DETECTED 12/13/2014   ETOH < 3 09/04/2011    Inflammation Markers Lab Results  Component Value Date   ESRSEDRATE 58* 11/28/2013    Renal Function Lab Results  Component Value Date   BUN 7 12/23/2014   CREATININE 0.77 12/23/2014   GFRAA >60 12/23/2014   GFRNONAA >60 12/23/2014    Hepatic Function Lab Results  Component Value Date   AST 24 12/15/2014   ALT 15 12/15/2014   ALBUMIN 2.4* 12/15/2014    Electrolytes Lab Results  Component Value Date   NA 139 12/23/2014   K 3.6 12/23/2014   CL 107 12/23/2014   CALCIUM 8.3* 12/23/2014   MG 1.6* 12/23/2014    Allergies  Ms. Delbridge is allergic to alcohol; bextra; celebrex;  penicillins; sulfa antibiotics; sulfacetamide sodium; vioxx; and metformin.  Meds  The patient has a current medication list which includes the following prescription(s): brisdelle, calcium-vitamin d, diclofenac, dicyclomine, escitalopram, ferrous sulfate, flurazepam, lisinopril, mirabegron er, morphine, myrbetriq, oxycodone hcl, pantoprazole, quetiapine, temazepam, tizanidine, vitamin d (ergocalciferol), morphine, morphine, oxycodone hcl, and oxycodone hcl.  Current Outpatient Prescriptions on File Prior to Visit  Medication Sig  . BRISDELLE 7.5 MG CAPS Take 1 capsule by mouth at bedtime.  . Calcium Carbonate-Vitamin D (CALCIUM-VITAMIN D) 500-200 MG-UNIT tablet Take by mouth.  . diclofenac (VOLTAREN) 75 MG EC tablet TK 1 T PO BID  . dicyclomine (BENTYL) 20 MG tablet Take 20 mg by mouth daily.   Marland Kitchen escitalopram (LEXAPRO) 10 MG tablet Take 1 tablet by mouth daily.  . ferrous sulfate 325 (65 FE) MG tablet Take by mouth.  . flurazepam (DALMANE) 15 MG capsule Take 1 capsule (15 mg total) by mouth at bedtime as needed for sleep.  Marland Kitchen lisinopril (PRINIVIL,ZESTRIL) 10 MG tablet TAKE 1 TABLET BY MOUTH ONCE DAILY.  . MYRBETRIQ 50 MG TB24 tablet 50 mg daily.   . pantoprazole (PROTONIX) 40 MG tablet TAKE 1 TABLET BY MOUTH ONCE DAILY  . QUEtiapine (SEROQUEL) 300 MG tablet Take 2 tablets (600 mg total) by mouth at bedtime.  . Vitamin D, Ergocalciferol, (DRISDOL) 50000 units CAPS capsule    No current facility-administered medications on file prior to visit.    ROS  Constitutional: Afebrile, no chills, well hydrated and well nourished Gastrointestinal: negative Musculoskeletal:negative Neurological: negative Behavioral/Psych: negative  PFSH  Medical:  Ms. Gauer  has a past medical history of Other chronic pain; Restless legs syndrome (RLS); Disorder of bone and cartilage, unspecified; Unspecified essential hypertension; Esophageal reflux; Personal history of malignant neoplasm of cervix uteri;  Irritable bowel syndrome; Contact dermatitis and other eczema, due to unspecified cause; Lumbago; Migraine, unspecified, without mention of intractable migraine without mention of status migrainosus; Unspecified schizophrenia, unspecified condition; Personal history of colonic polyps; Symptomatic menopausal or female climacteric states; Other and unspecified hyperlipidemia; Pain in joint, site unspecified; Hypertonicity of bladder; Pain in joint, forearm; and History of renal failure and respiratory acidosis (05/24/2014) (11/20/2014). Family: family history includes Anemia in her mother; Breast cancer in her paternal grandmother; Diabetes type II in her paternal grandfather; Drug abuse in her brother; Melanoma in her father; Other in her father and mother; Skin cancer in her father. Surgical:  has past surgical history that includes Breast lumpectomy; Hand surgery (1988); Vaginal hysterectomy (2005); Bilateral oophorectomy (2005); Cervical cone biopsy (2005); Shoulder arthroscopy (2007); Retinal detachment surgery (2010); and Foot surgery (Left). Tobacco:  reports that she quit smoking about 2 years ago. She has never used smokeless tobacco. Alcohol:  reports that she does not drink alcohol. Drug:  reports that she does not use illicit drugs.  Physical Exam  Vitals:  Today's Vitals   03/27/15 1453 03/27/15 1455  BP: 143/57   Pulse: 87   Temp: 97.6 F (36.4 C)   Resp: 16   Height: 5\' 5"  (1.651 m)   Weight: 230 lb (104.327 kg)   SpO2: 99%   PainSc: 4  4   PainLoc: Ankle     Calculated BMI: Body mass index is 38.27 kg/(m^2). Severe obesity (Class II) (35-39.9 kg/m2) - 136% higher incidence of chronic pain  General appearance: alert, cooperative, appears stated age, no distress and morbidly obese Eyes: PERLA Respiratory: No evidence respiratory distress, no audible rales or ronchi and no use of accessory muscles of respiration  Cervical Spine Inspection: Normal anatomy Alignment:  Symetrical ROM: Adequate  Upper Extremities Inspection: No gross anomalies detected ROM: Decreased Sensory: Normal Motor: Unremarkable  Thoracic Spine Inspection: No gross anomalies detected Alignment: Symetrical ROM: Adequate  Lumbar Spine Inspection: No gross anomalies detected Alignment: Symetrical ROM: Decreased  Gait: Antalgic (limping)  Lower Extremities Inspection: No gross anomalies detected ROM: Adequate Sensory:  Normal Motor: Grossly intact  Assessment & Plan  Primary Diagnosis & Pertinent Problem List: The primary encounter diagnosis was Chronic pain. Diagnoses of Encounter for therapeutic drug level monitoring, Long term current use of opiate analgesic, Chronic lower extremity pain (Right), Opiate use (75 MME/Day), Chronic ankle pain, unspecified laterality, Chronic pain of lower extremity, unspecified laterality, Chronic knee pain (Bilateral) (R>L), and Primary osteoarthritis of both knees were also pertinent to this visit.  Visit Diagnosis: 1. Chronic pain   2. Encounter for therapeutic drug level monitoring   3. Long term current use of opiate analgesic   4. Chronic lower extremity pain (Right)   5. Opiate use (75 MME/Day)   6. Chronic ankle pain, unspecified laterality   7. Chronic pain of lower extremity, unspecified laterality   8. Chronic knee pain (Bilateral) (R>L)   9. Primary osteoarthritis of both knees     Problem-specific Plan(s): Chronic lower extremity pain (Location of Tertiary source of pain) (Bilateral) (L>R) This pain appears to be referred as it travels down both lower extremities through the back of the leg and does not reach the feet.    Plan of Care  Pharmacotherapy (Medications Ordered): Meds ordered this encounter  Medications  . morphine (MS CONTIN) 15 MG 12 hr tablet    Sig: Take 1 tablet (15 mg total) by mouth every 12 (twelve) hours.    Dispense:  60  tablet    Refill:  0    Do not place this medication, or any other  prescription from our practice, on "Automatic Refill". Patient may have prescription filled one day early if pharmacy is closed on scheduled refill date. Do not fill until: 03/31/15 To last until: 04/30/15  . Oxycodone HCl 10 MG TABS    Sig: Take 1 tablet (10 mg total) by mouth every 8 (eight) hours as needed.    Dispense:  90 tablet    Refill:  0    Do not place this medication, or any other prescription from our practice, on "Automatic Refill". Patient may have prescription filled one day early if pharmacy is closed on scheduled refill date. Do not fill until: 03/31/15 To last until: 04/30/15  . tiZANidine (ZANAFLEX) 4 MG tablet    Sig: Take 1 tablet (4 mg total) by mouth every 8 (eight) hours as needed for muscle spasms.    Dispense:  90 tablet    Refill:  2    Do not place this medication, or any other prescription from our practice, on "Automatic Refill". Patient may have prescription filled one day early if pharmacy is closed on scheduled refill date.  . morphine (MS CONTIN) 15 MG 12 hr tablet    Sig: Take 1 tablet (15 mg total) by mouth every 12 (twelve) hours.    Dispense:  60 tablet    Refill:  0    Do not place this medication, or any other prescription from our practice, on "Automatic Refill". Patient may have prescription filled one day early if pharmacy is closed on scheduled refill date. Do not fill until: 04/30/15 To last until: 05/30/15  . morphine (MS CONTIN) 15 MG 12 hr tablet    Sig: Take 1 tablet (15 mg total) by mouth every 12 (twelve) hours.    Dispense:  60 tablet    Refill:  0    Do not place this medication, or any other prescription from our practice, on "Automatic Refill". Patient may have prescription filled one day early if pharmacy is closed on scheduled refill date. Do not fill until: 05/30/15 To last until: 06/29/15  . DISCONTD: Oxycodone HCl 10 MG TABS    Sig: Take 1 tablet (10 mg total) by mouth every 8 (eight) hours as needed.    Dispense:  90 tablet     Refill:  0    Do not place this medication, or any other prescription from our practice, on "Automatic Refill". Patient may have prescription filled one day early if pharmacy is closed on scheduled refill date. Do not fill until: 04/30/15 To last until: 05/30/15  . DISCONTD: Oxycodone HCl 10 MG TABS    Sig: Take 1 tablet (10 mg total) by mouth every 8 (eight) hours as needed.    Dispense:  90 tablet    Refill:  0    Do not place this medication, or any other prescription from our practice, on "Automatic Refill". Patient may have prescription filled one day early if pharmacy is closed on scheduled refill date. Do not fill until: 05/30/15 To last until: 06/29/15  . DISCONTD: Oxycodone HCl 10 MG TABS    Sig: Take 1 tablet (10 mg total) by mouth every 8 (eight) hours as needed.    Dispense:  90 tablet    Refill:  0    Do not place this medication, or any other prescription from our practice, on "Automatic Refill". Patient may have prescription filled one day  early if pharmacy is closed on scheduled refill date. Do not fill until: 04/30/15 To last until: 05/30/15  . DISCONTD: Oxycodone HCl 10 MG TABS    Sig: Take 1 tablet (10 mg total) by mouth every 8 (eight) hours as needed.    Dispense:  90 tablet    Refill:  0    Do not place this medication, or any other prescription from our practice, on "Automatic Refill". Patient may have prescription filled one day early if pharmacy is closed on scheduled refill date. Do not fill until: 05/30/15 To last until: 06/29/15  . Oxycodone HCl 10 MG TABS    Sig: Take 1 tablet (10 mg total) by mouth every 8 (eight) hours as needed.    Dispense:  90 tablet    Refill:  0    Do not place this medication, or any other prescription from our practice, on "Automatic Refill". Patient may have prescription filled one day early if pharmacy is closed on scheduled refill date. Do not fill until: 04/30/15 To last until: 05/30/15  . Oxycodone HCl 10 MG TABS    Sig:  Take 1 tablet (10 mg total) by mouth every 8 (eight) hours as needed.    Dispense:  90 tablet    Refill:  0    Do not place this medication, or any other prescription from our practice, on "Automatic Refill". Patient may have prescription filled one day early if pharmacy is closed on scheduled refill date. Do not fill until: 05/30/15 To last until: 06/29/15    [facility] Ordered: Orders Placed This Encounter  Procedures  . ToxASSURE Select 13 (MW), Urine    Volume: 30 ml(s). Minimum 3 ml of urine is needed. Document temperature of fresh sample. Indications: Long term (current) use of opiate analgesic (Z79.891)  . Comprehensive metabolic panel    Standing Status: Future     Number of Occurrences:      Standing Expiration Date: 04/26/2015    Order Specific Question:  Has the patient fasted?    Answer:  No  . C-reactive protein    Standing Status: Future     Number of Occurrences:      Standing Expiration Date: 04/26/2015  . Magnesium    Standing Status: Future     Number of Occurrences:      Standing Expiration Date: 04/26/2015  . Sedimentation rate    Standing Status: Future     Number of Occurrences:      Standing Expiration Date: 04/26/2015  . Vitamin B12    Standing Status: Future     Number of Occurrences:      Standing Expiration Date: 04/26/2015  . Vitamin D 1,25 dihydroxy    Standing Status: Future     Number of Occurrences:      Standing Expiration Date: 04/26/2015    Imaging Ordered: None  Interventional Therapies: Scheduled: None at this time. PRN Procedures: None at this time.    Referral(s) or Consult(s): None at this time.  Medications administered during this visit: [name] had no medications administered during this visit.  Future Appointments Date Time Provider Department Center  06/19/2015 1:20 PM Delano MetzFrancisco Jazmarie Biever, MD [facility] None    Primary Care Physician: Reesa ChewGRIFFITH, [name], MD [facility]  Facility Note by: Sydnee LevansFrancisco A. Laban EmperorNaveira, M.D, DABA, DABAPM, DABPM, DABIPP, FIPP  Pain Score Disclaimer: We use the NRS-11 scale. This is a self-reported, subjective measurement of pain severity with only modest accuracy. It is used  primarily to identify changes within a particular patient. It must be understood that outpatient pain scales are significantly less accurate that those used for research, where they can be applied under ideal controlled circumstances with minimal exposure to variables. In reality, the score is likely to be a combination of pain intensity and pain affect, where pain affect describes the degree of emotional arousal or changes in action readiness caused by the sensory experience of pain. Factors such as social and work situation, setting, emotional state, anxiety levels, expectation, and prior pain experience may influence pain perception and show large inter-individual differences that may also be affected by time variables.

## 2015-03-27 NOTE — Patient Instructions (Signed)
Instructed patient to [facility]  Patient states understanding.

## 2015-03-31 DIAGNOSIS — M170 Bilateral primary osteoarthritis of knee: Secondary | ICD-10-CM | POA: Insufficient documentation

## 2015-03-31 DIAGNOSIS — G8929 Other chronic pain: Secondary | ICD-10-CM | POA: Insufficient documentation

## 2015-03-31 DIAGNOSIS — M25579 Pain in unspecified ankle and joints of unspecified foot: Secondary | ICD-10-CM

## 2015-03-31 DIAGNOSIS — M25562 Pain in left knee: Secondary | ICD-10-CM

## 2015-03-31 DIAGNOSIS — M25561 Pain in right knee: Secondary | ICD-10-CM

## 2015-03-31 DIAGNOSIS — M79606 Pain in leg, unspecified: Secondary | ICD-10-CM

## 2015-03-31 NOTE — Assessment & Plan Note (Signed)
This pain appears to be referred as it travels down both lower extremities through the back of the leg and does not reach the feet.

## 2015-04-01 LAB — TOXASSURE SELECT 13 (MW), URINE: PDF: 0

## 2015-04-03 ENCOUNTER — Encounter: Payer: Medicare Other | Admitting: Pain Medicine

## 2015-04-17 ENCOUNTER — Other Ambulatory Visit: Payer: Self-pay | Admitting: Psychiatry

## 2015-05-06 DEATH — deceased

## 2015-06-19 ENCOUNTER — Encounter: Payer: Medicare Other | Admitting: Pain Medicine

## 2015-11-14 IMAGING — CR DG CHEST 1V PORT
1 series · 1 of 1 positions shown · non-contrast
Comparison: None.

CLINICAL DATA: SOB, weakness; family states pt getting more
confused since [REDACTED]

EXAM:
PORTABLE CHEST - 1 VIEW

[ap]
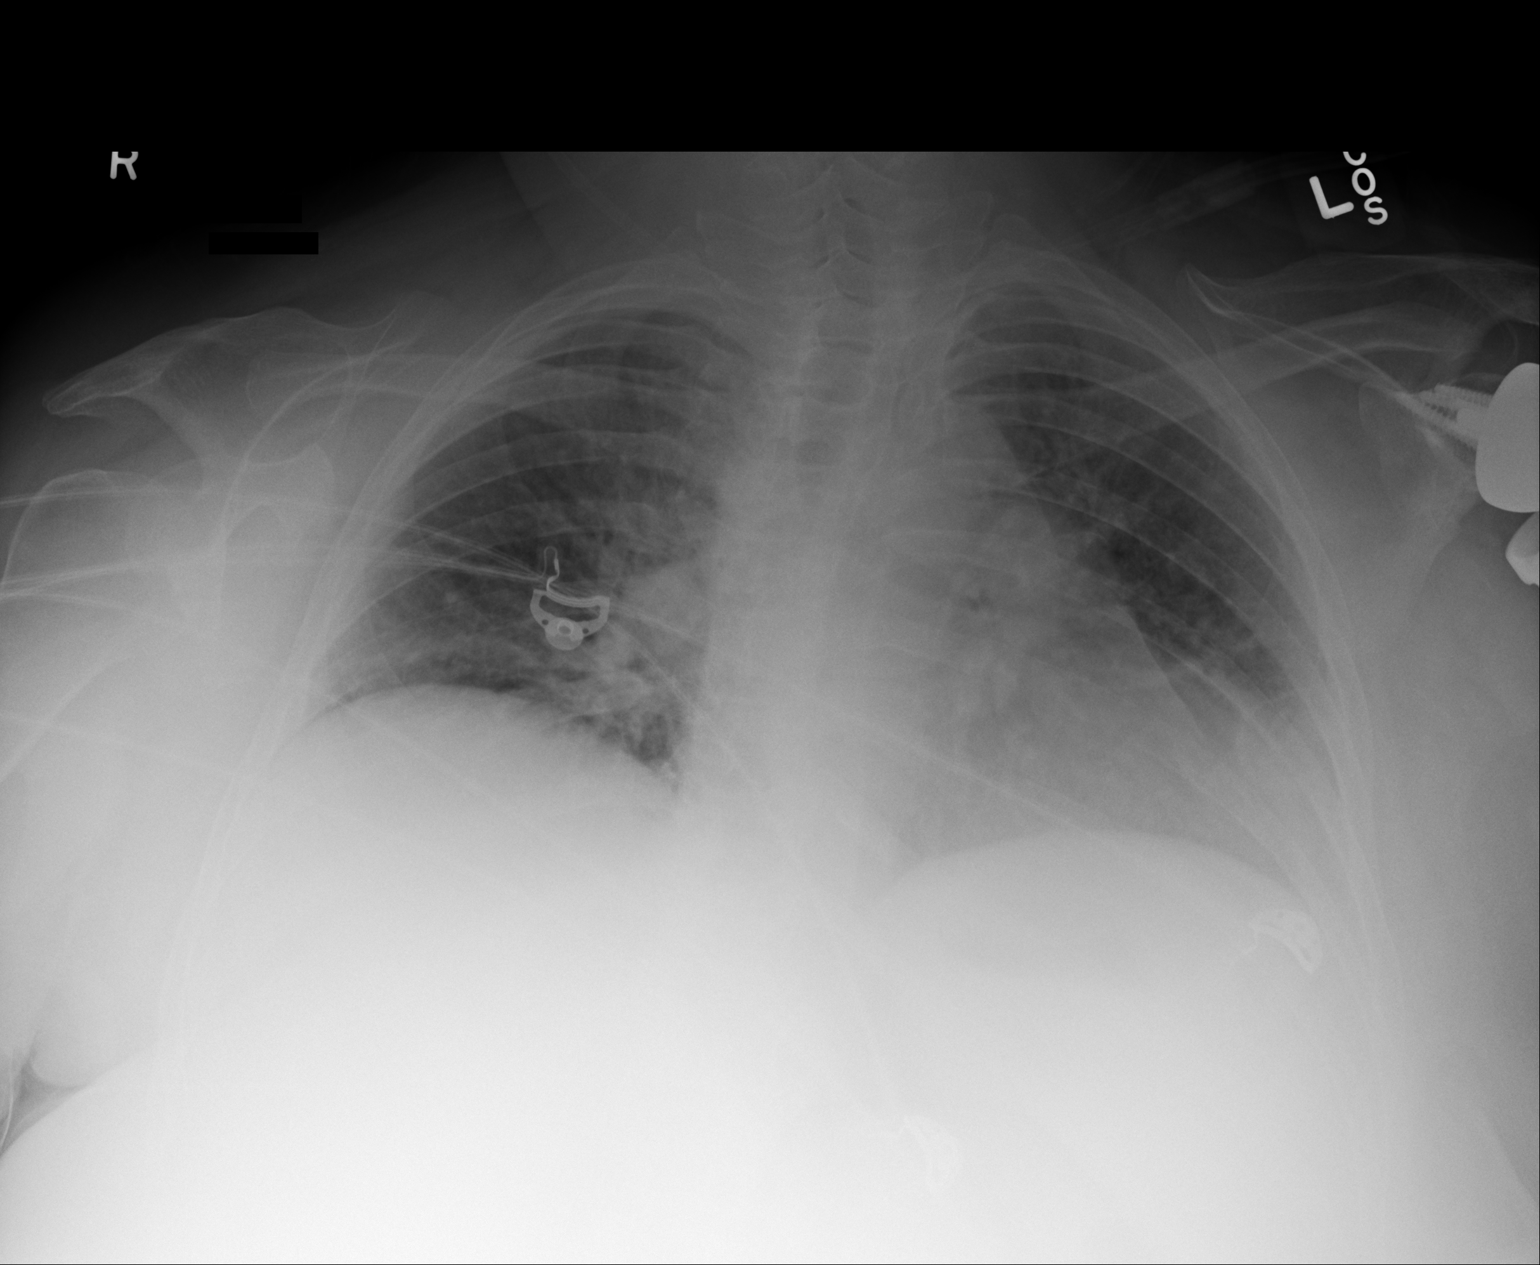

[1 of 1 positions shown; findings below may reference images not displayed]

FINDINGS: There are low lung volumes. There is crowding of the interstitial
markings secondary to low lung volumes. There is no focal
parenchymal opacity, pleural effusion, or pneumothorax. The heart
and mediastinal contours are unremarkable.

The osseous structures are unremarkable.
IMPRESSION: No active disease.

## 2015-11-14 IMAGING — US US EXTREM LOW VENOUS BILAT
1 series · 13 of 24 positions shown · non-contrast
Comparison: None.

CLINICAL DATA: Bilateral lower extremity edema



[Series 1: us extrem low venous bilat · 0.10mm/px · 66 acquisitions, 13 frames shown]
[im 1/66]
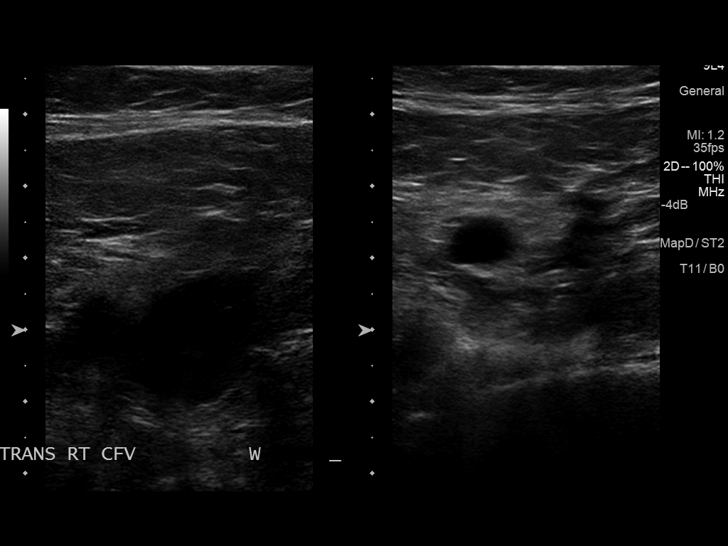
[im 6/66]
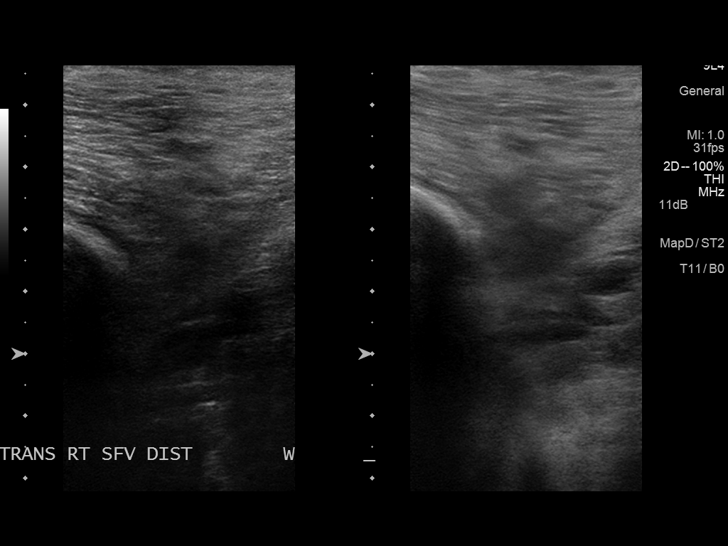
[im 12/66]
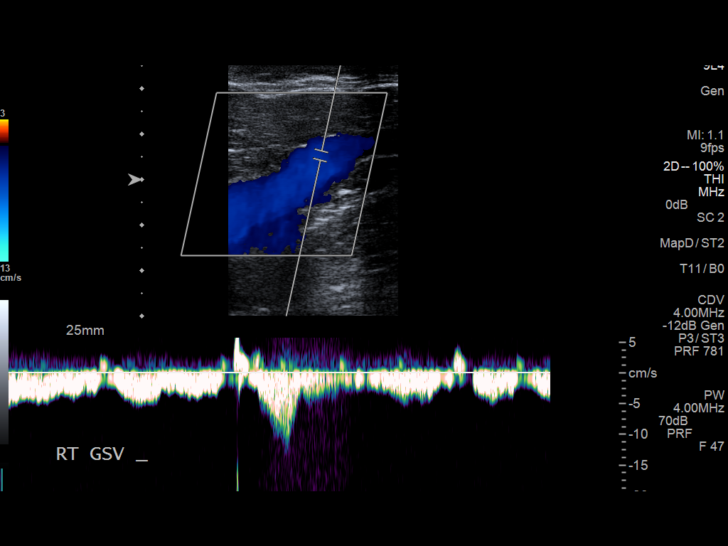
[im 17/66]
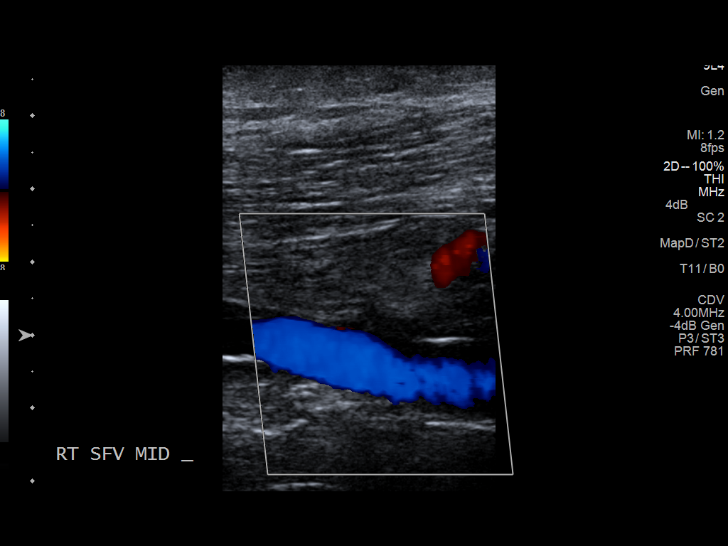
[im 23/66]
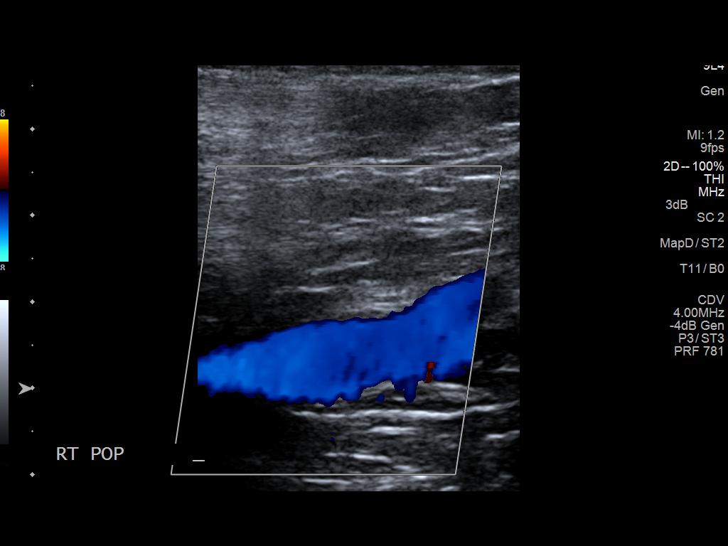
[im 29/66]
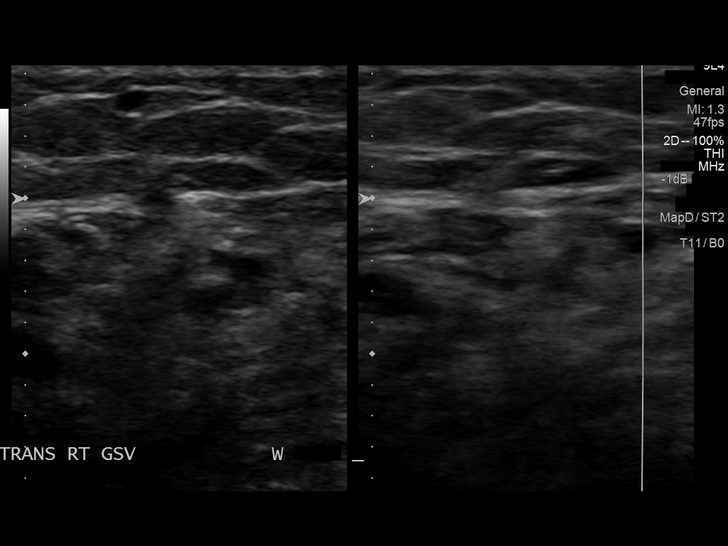
[im 37/66]
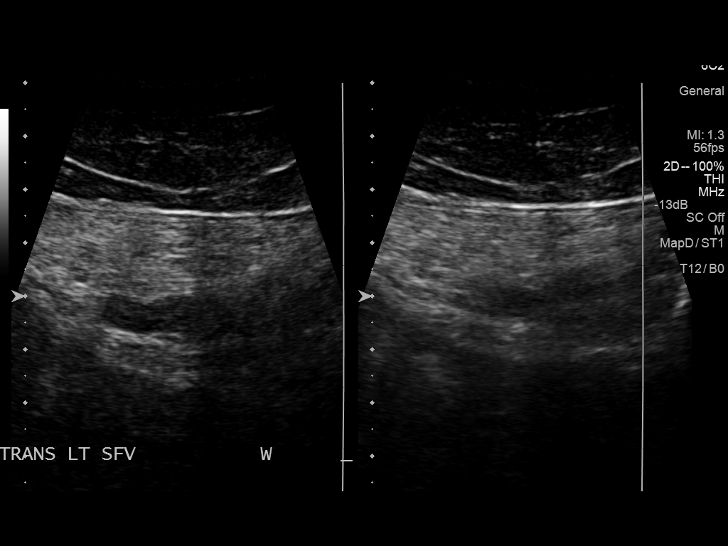
[im 40/66]
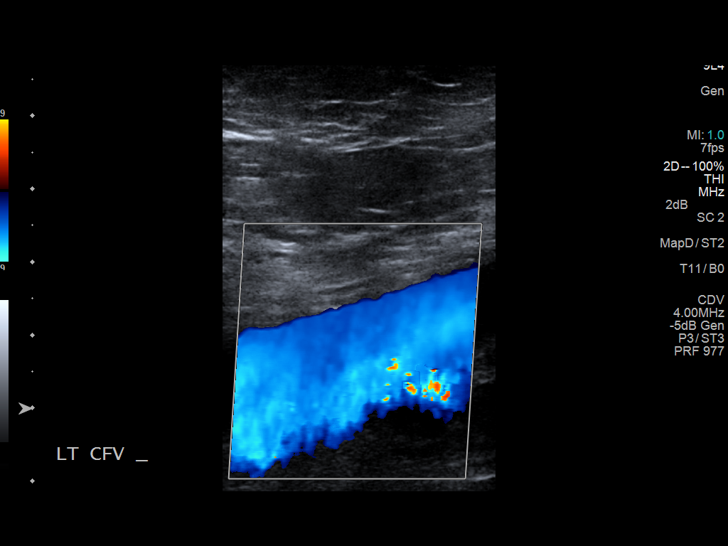
[im 43/66]
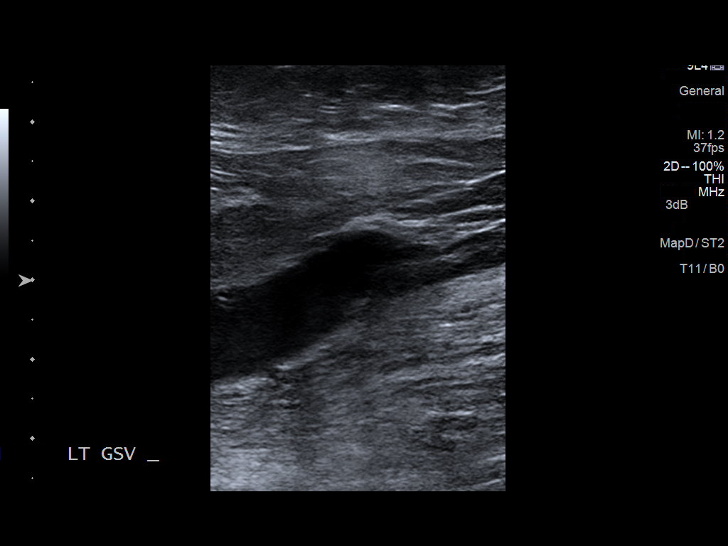
[im 49/66]
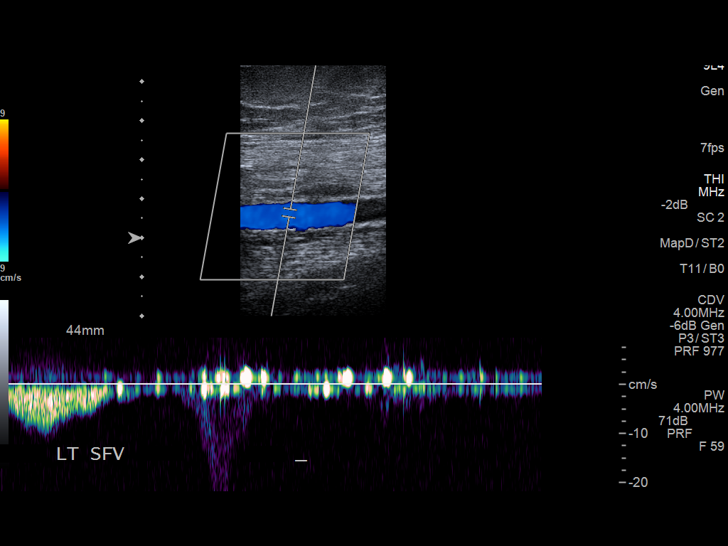
[im 54/66]
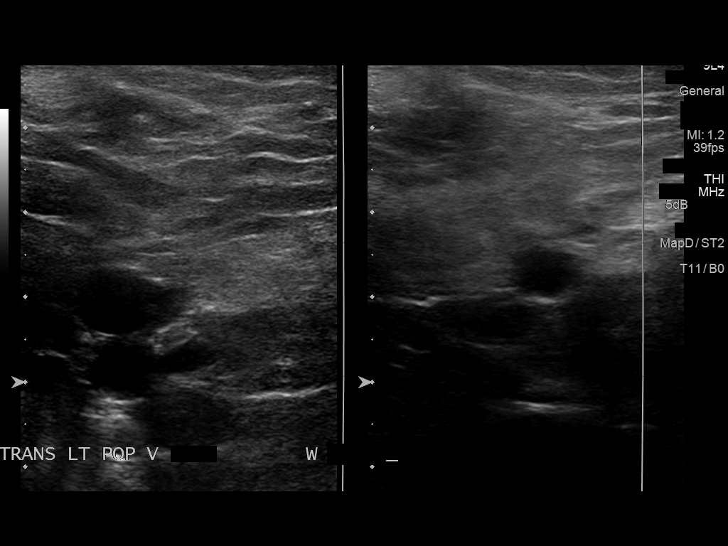
[im 60/66]
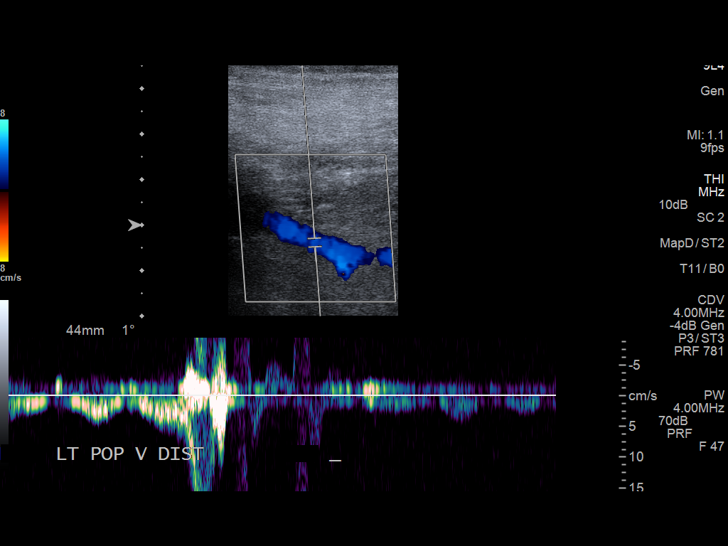
[im 66/66]
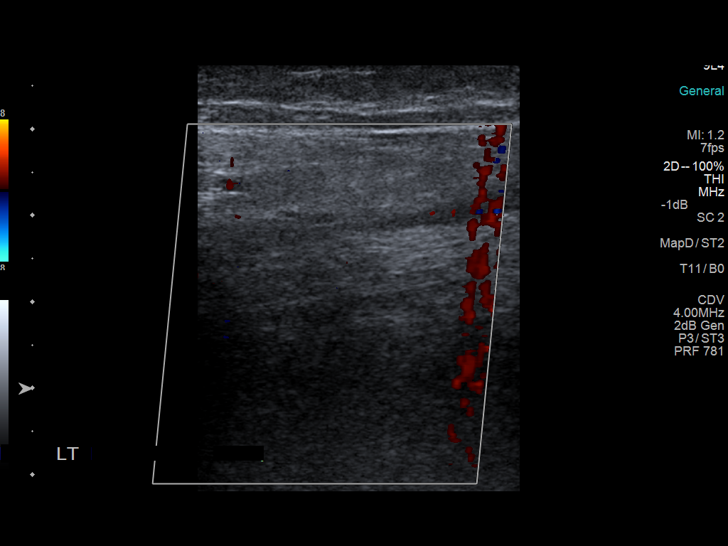

[13 of 24 positions shown; findings below may reference images not displayed]

FINDINGS: RIGHT LOWER EXTREMITY

Common Femoral Vein: No evidence of thrombus. Normal
compressibility, respiratory phasicity and response to augmentation.

Saphenofemoral Junction: No evidence of thrombus. Normal
compressibility and flow on color Doppler imaging.

Profunda Femoral Vein: No evidence of thrombus. Normal
compressibility and flow on color Doppler imaging.

Femoral Vein: No evidence of thrombus. Normal compressibility,
respiratory phasicity and response to augmentation.

Popliteal Vein: No evidence of thrombus. Normal compressibility,
respiratory phasicity and response to augmentation.

Calf Veins: Not well demonstrated.

Superficial Great Saphenous Vein: No evidence of thrombus. Normal
compressibility and flow on color Doppler imaging.

Venous Reflux:  None.

Other Findings:  None.

LEFT LOWER EXTREMITY

Common Femoral Vein: No evidence of thrombus. Normal
compressibility, respiratory phasicity and response to augmentation.

Saphenofemoral Junction: No evidence of thrombus. Normal
compressibility and flow on color Doppler imaging.

Profunda Femoral Vein: No evidence of thrombus. Normal
compressibility and flow on color Doppler imaging.

Femoral Vein: No evidence of thrombus. Normal compressibility,
respiratory phasicity and response to augmentation.

Popliteal Vein: No evidence of thrombus. Normal compressibility,
respiratory phasicity and response to augmentation.

Calf Veins: Not well demonstrated.

Superficial Great Saphenous Vein: No evidence of thrombus. Normal
compressibility and flow on color Doppler imaging.

Venous Reflux:  None.

Other Findings:  None.
IMPRESSION: No evidence of significant occlusive deep venous thrombosis.

## 2015-11-15 IMAGING — CT CT HEAD WITHOUT CONTRAST
1 series · 16 of 30 positions shown, 20 images · non-contrast
Comparison: 11/11/2012

CLINICAL DATA: Slurred speech and weakness in the arms.

EXAM:
CT HEAD WITHOUT CONTRAST
TECHNIQUE: Contiguous axial images were obtained from the base of the skull
through the vertex without intravenous contrast.

[Series 2: head wo · axial · 0.47mm/px · z∈[-75,+58]mm · 16 of 30 slices shown, 20 images]
[im 2/30  brain]
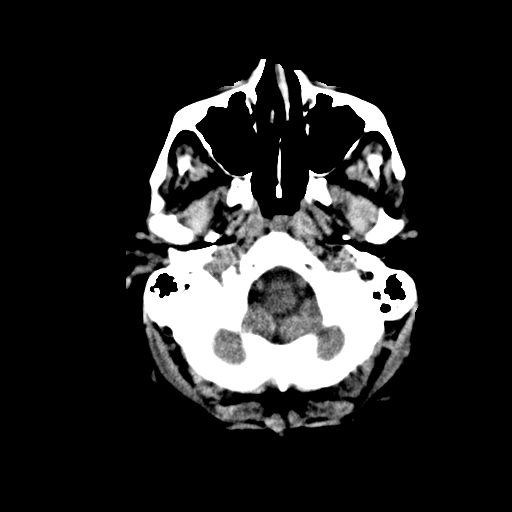
[im 2/30  bone]
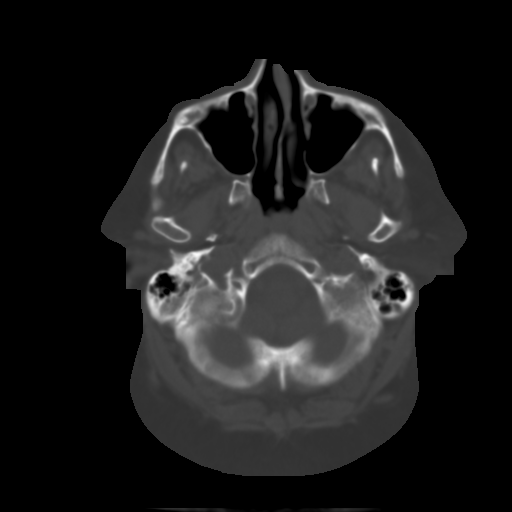
[im 4/30  brain]
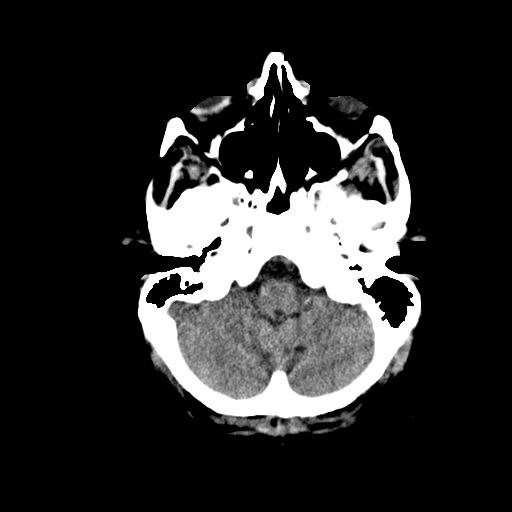
[im 6/30  brain]
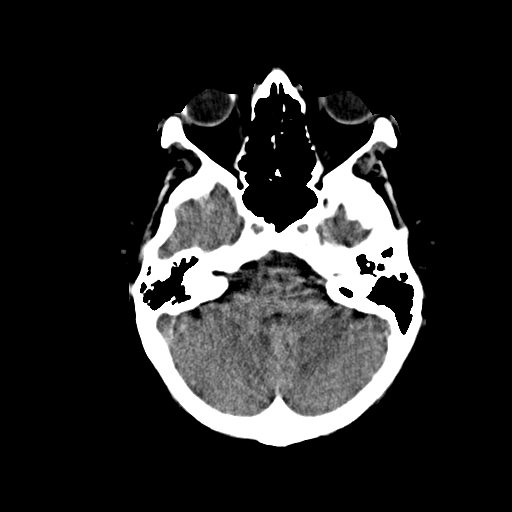
[im 8/30  brain]
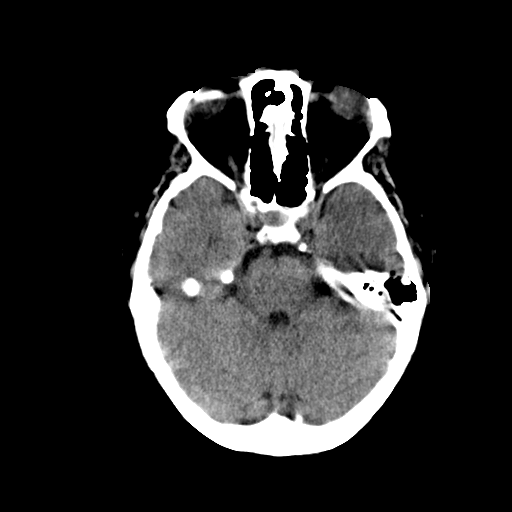
[im 9/30  brain]
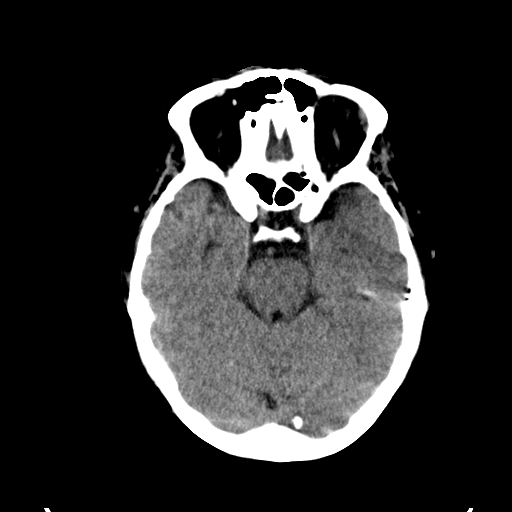
[im 9/30  bone]
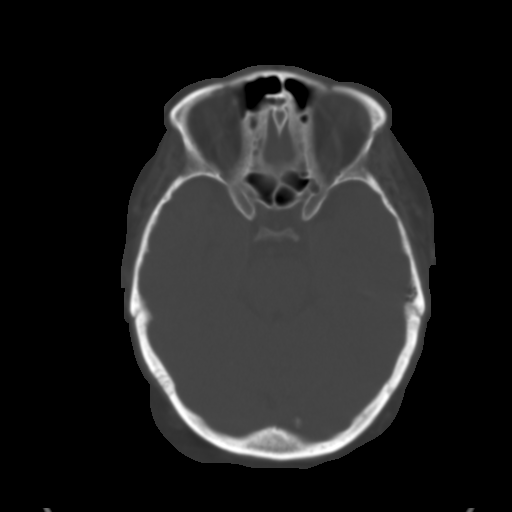
[im 11/30  brain]
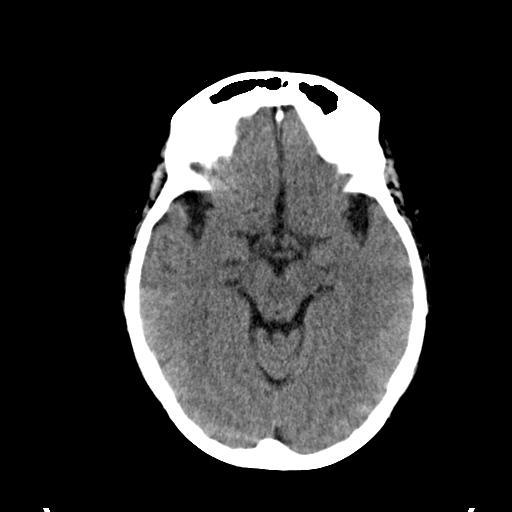
[im 13/30  brain]
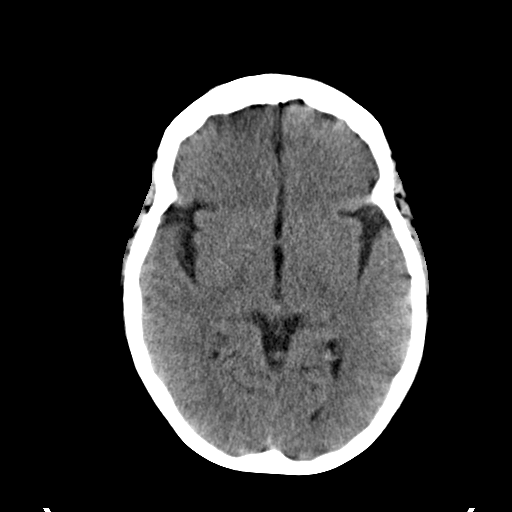
[im 15/30  brain]
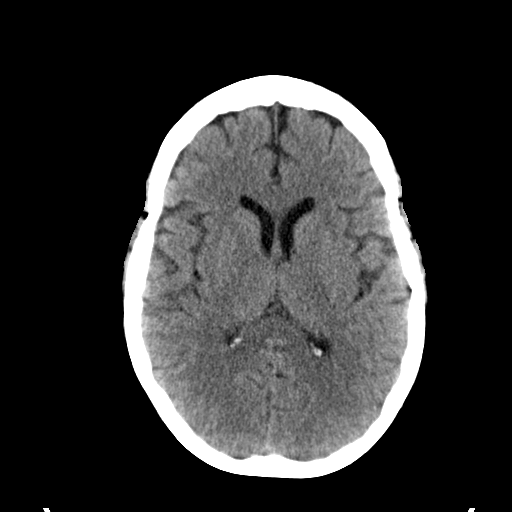
[im 16/30  brain]
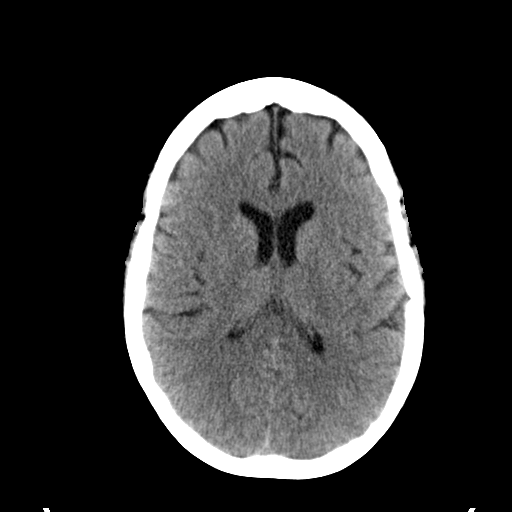
[im 16/30  bone]
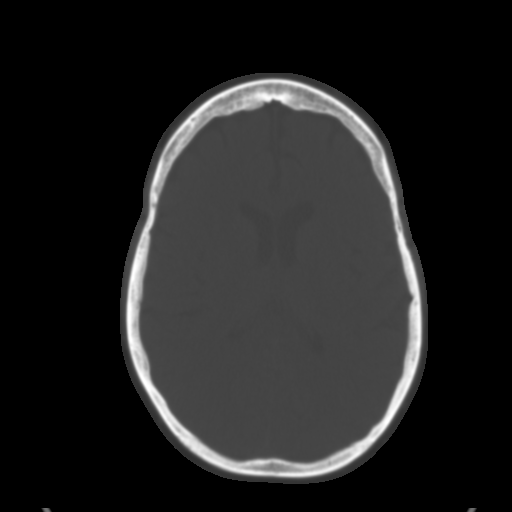
[im 18/30  brain]
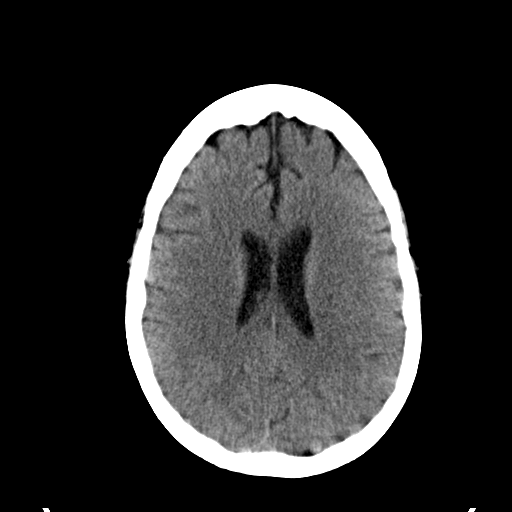
[im 20/30  brain]
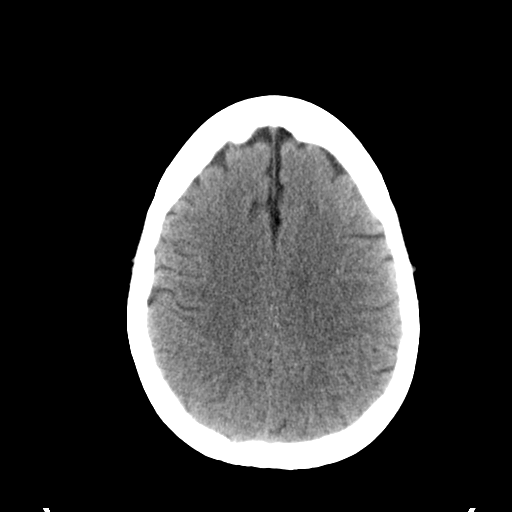
[im 22/30  brain]
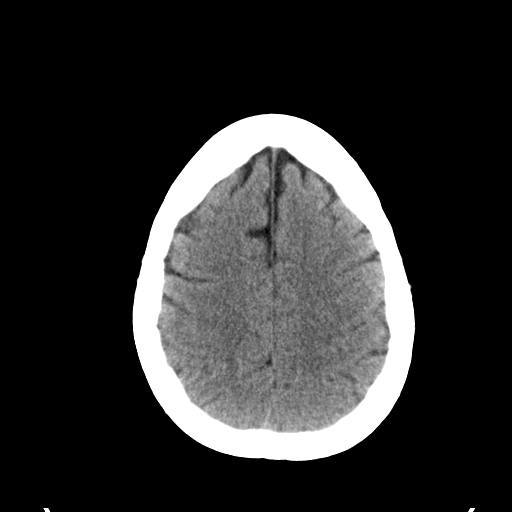
[im 23/30  brain]
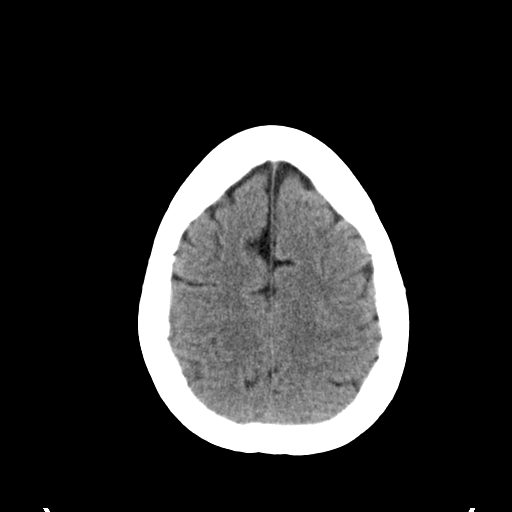
[im 23/30  bone]
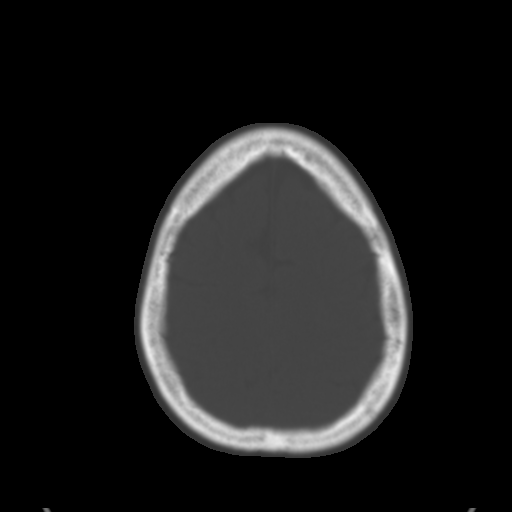
[im 25/30  brain]
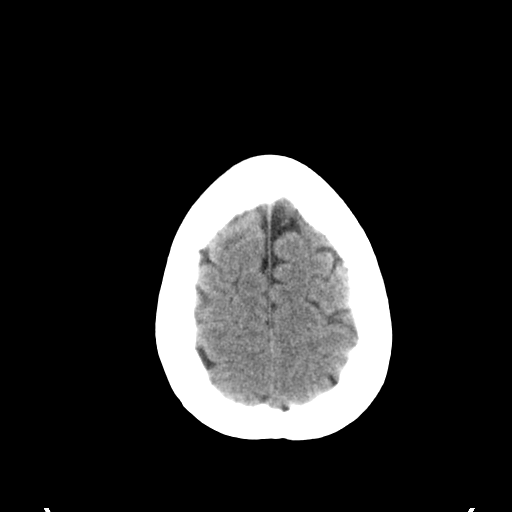
[im 27/30  brain]
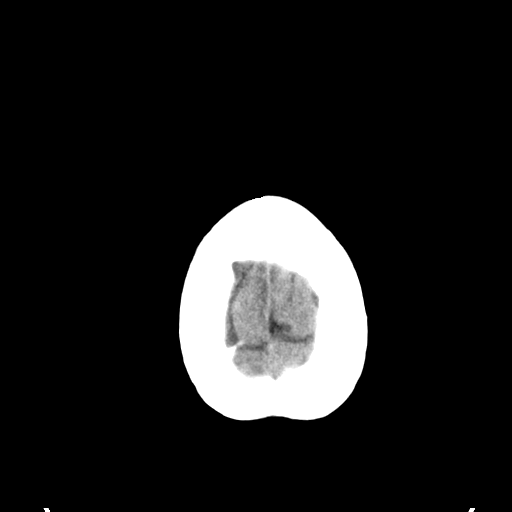
[im 29/30  brain]
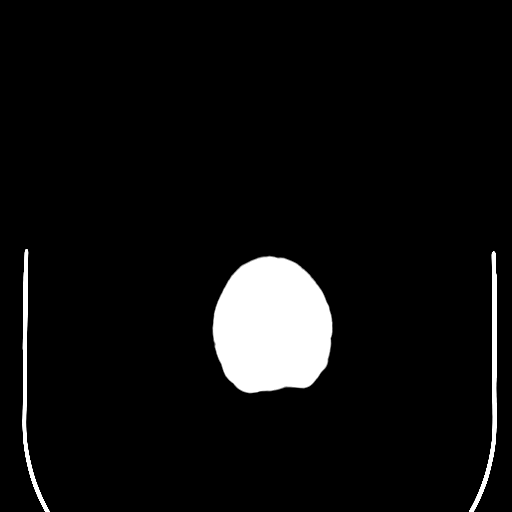

[16 of 30 positions shown; findings below may reference images not displayed]

FINDINGS: The brainstem, cerebellum, cerebral peduncles, thalamus, basal
ganglia, basilar cisterns, and ventricular system appear within
normal limits. No intracranial hemorrhage, mass lesion, or acute
CVA. Right scleral band noted. Visualized paranasal sinuses clear.
IMPRESSION: 1.  No significant abnormality identified.
